# Patient Record
Sex: Male | Born: 1950 | Race: White | Hispanic: No | Marital: Married | Smoking: Never smoker
Health system: Southern US, Community
[De-identification: ages and names within clinical notes are randomized; demographics above are authoritative.]

## PROBLEM LIST (undated history)

## (undated) DIAGNOSIS — E785 Hyperlipidemia, unspecified: Secondary | ICD-10-CM

## (undated) DIAGNOSIS — N189 Chronic kidney disease, unspecified: Secondary | ICD-10-CM

## (undated) DIAGNOSIS — E559 Vitamin D deficiency, unspecified: Secondary | ICD-10-CM

## (undated) DIAGNOSIS — K219 Gastro-esophageal reflux disease without esophagitis: Secondary | ICD-10-CM

## (undated) DIAGNOSIS — I10 Essential (primary) hypertension: Secondary | ICD-10-CM

## (undated) DIAGNOSIS — R7303 Prediabetes: Secondary | ICD-10-CM

## (undated) DIAGNOSIS — T7840XA Allergy, unspecified, initial encounter: Secondary | ICD-10-CM

## (undated) DIAGNOSIS — H269 Unspecified cataract: Secondary | ICD-10-CM

## (undated) DIAGNOSIS — C801 Malignant (primary) neoplasm, unspecified: Secondary | ICD-10-CM

## (undated) HISTORY — DX: Essential (primary) hypertension: I10

## (undated) HISTORY — DX: Gastro-esophageal reflux disease without esophagitis: K21.9

## (undated) HISTORY — DX: Allergy, unspecified, initial encounter: T78.40XA

## (undated) HISTORY — DX: Hyperlipidemia, unspecified: E78.5

## (undated) HISTORY — DX: Vitamin D deficiency, unspecified: E55.9

## (undated) HISTORY — PX: BASAL CELL CARCINOMA EXCISION: SHX1214

## (undated) HISTORY — DX: Prediabetes: R73.03

## (undated) HISTORY — DX: Chronic kidney disease, unspecified: N18.9

## (undated) HISTORY — PX: SPLENECTOMY: SUR1306

## (undated) HISTORY — DX: Unspecified cataract: H26.9

## (undated) HISTORY — DX: Malignant (primary) neoplasm, unspecified: C80.1

## (undated) HISTORY — PX: KNEE ARTHROSCOPY: SHX127

## (undated) HISTORY — PX: OTHER SURGICAL HISTORY: SHX169

## (undated) HISTORY — PX: COLONOSCOPY: SHX174

---

## 1998-05-02 ENCOUNTER — Ambulatory Visit (HOSPITAL_BASED_OUTPATIENT_CLINIC_OR_DEPARTMENT_OTHER): Admission: RE | Admit: 1998-05-02 | Discharge: 1998-05-02 | Payer: Self-pay | Admitting: *Deleted

## 1999-05-21 ENCOUNTER — Ambulatory Visit (HOSPITAL_COMMUNITY): Admission: RE | Admit: 1999-05-21 | Discharge: 1999-05-21 | Payer: Self-pay | Admitting: Internal Medicine

## 1999-05-21 ENCOUNTER — Encounter: Payer: Self-pay | Admitting: Internal Medicine

## 1999-12-29 ENCOUNTER — Ambulatory Visit (HOSPITAL_COMMUNITY): Admission: RE | Admit: 1999-12-29 | Discharge: 1999-12-29 | Payer: Self-pay | Admitting: *Deleted

## 2001-03-30 ENCOUNTER — Encounter: Payer: Self-pay | Admitting: Internal Medicine

## 2001-03-30 ENCOUNTER — Ambulatory Visit (HOSPITAL_COMMUNITY): Admission: RE | Admit: 2001-03-30 | Discharge: 2001-03-30 | Payer: Self-pay | Admitting: Internal Medicine

## 2001-05-09 ENCOUNTER — Encounter (INDEPENDENT_AMBULATORY_CARE_PROVIDER_SITE_OTHER): Payer: Self-pay | Admitting: Specialist

## 2001-05-09 ENCOUNTER — Ambulatory Visit (HOSPITAL_BASED_OUTPATIENT_CLINIC_OR_DEPARTMENT_OTHER): Admission: RE | Admit: 2001-05-09 | Discharge: 2001-05-09 | Payer: Self-pay | Admitting: Plastic Surgery

## 2002-09-24 ENCOUNTER — Ambulatory Visit (HOSPITAL_COMMUNITY): Admission: RE | Admit: 2002-09-24 | Discharge: 2002-09-24 | Payer: Self-pay | Admitting: Internal Medicine

## 2002-09-24 ENCOUNTER — Encounter: Payer: Self-pay | Admitting: Internal Medicine

## 2007-05-26 ENCOUNTER — Ambulatory Visit: Payer: Self-pay | Admitting: Internal Medicine

## 2007-06-09 ENCOUNTER — Ambulatory Visit: Payer: Self-pay | Admitting: Internal Medicine

## 2008-09-06 ENCOUNTER — Ambulatory Visit (HOSPITAL_BASED_OUTPATIENT_CLINIC_OR_DEPARTMENT_OTHER): Admission: RE | Admit: 2008-09-06 | Discharge: 2008-09-06 | Payer: Self-pay | Admitting: Orthopedic Surgery

## 2008-10-14 ENCOUNTER — Ambulatory Visit (HOSPITAL_COMMUNITY): Admission: RE | Admit: 2008-10-14 | Discharge: 2008-10-14 | Payer: Self-pay | Admitting: Internal Medicine

## 2010-02-06 ENCOUNTER — Encounter: Admission: RE | Admit: 2010-02-06 | Discharge: 2010-02-06 | Payer: Self-pay | Admitting: Otolaryngology

## 2011-05-04 NOTE — Op Note (Signed)
[name], [name]                ACCOUNT NO.:  [id number]   MEDICAL RECORD NO.:  [id number]          PATIENT TYPE:  AMB   LOCATION:  DSC                          FACILITY:  [facility]   PHYSICIAN:  Eulas Post, MD    DATE OF BIRTH:  [birth year]   DATE OF PROCEDURE:  09/06/2008  DATE OF DISCHARGE:                               OPERATIVE REPORT   PREOPERATIVE DIAGNOSIS:  Left distal biceps tendon rupture.   POSTOPERATIVE DIAGNOSIS:  Left distal biceps tendon rupture.   OPERATIVE PROCEDURE:  Left distal biceps tendon repair.   OPERATIVE IMPLANT:  Biomet ZipLoop button x1.   PREOPERATIVE INDICATIONS:  Mr. Barry Flynn is a 60 year old man who  tore his biceps tendon.  He elected to undergo the above named  procedures.  The risks, benefits and alternatives were discussed with  him preoperatively including, but not limited to risks of infection,  bleeding, nerve injury, recurrent biceps tendon rupture, elbow  stiffness, loss of function, posterior interosseous nerve injury,  interosseous synostosis, cardiopulmonary complications, among others,  and he was willing to proceed.   OPERATIVE PROCEDURE:  The patient was brought to the operating room and  placed in supine position.  General anesthesia was administered.  A 1 g  of intravenous Ancef was given after a test dose due to a history of a  PENICILLIN allergy.  Left upper extremity was prepped and draped in  usual sterile fashion.  Tourniquet was utilized and total tourniquet  time was 1 hour and 7 minutes.  After prep and drape, anterior incision  was made protecting neurovascular structures.  Biceps tendon was exposed  and the end of the tendon was debrided back to normal tendon.  We then  used the #2 Maxon using running stitch down the tendon and then secured  the ZipLoop and then ran back up the tendon and tied it together.  We  then exposed the radial tuberosity and placed a guide pin and confirmed  under fluoroscopy.  We aimed  the pin distal and ulnar in order to reduce  the risk of injury to the posterior interosseous nerve.  We then placed  the pin bicortically and then used the 7-mm acorn reamer to open the  hole for the tendon.  We then drilled over the guidewire with a 4 mm to  the second cortex and then used a bur to smoothen the edges of the hole.  We made a trough proximally delivering the tendon into the bone.  We  then passed the button and care was taken to flip the button immediately  after it past the second cortex, so that no tissue could be entrapped.  We then confirmed that we had delivered a total of 1 cm of tendon into  the tunnel, based on markings that I made on the tendon before we  delivered it.  We then irrigated the wounds copiously and confirmed  excellent secure restoration of the biceps insertion.  We were able to  visualize the biceps stump on the radial tuberosity and placed it  anatomically.  We then closed the subcutaneous  tissue with 3-0 Vicryl  followed by a 4-0 Monocryl.  Steri-Strips  were placed followed by sterile gauze.  The patient was awakened and was  placed in a posterior splint.  He returned to the PACU in stable and  satisfactory condition.  There were no complications.  The patient  tolerated the procedure well.      Eulas Post, MD  Electronically Signed     JPL/MEDQ  D:  09/06/2008  T:  09/06/2008  Job:  [id number]

## 2011-05-07 NOTE — Op Note (Signed)
[place]  Patient:    Barry Flynn, Barry Flynn                       MRN: [id number] Proc. Date: 05/09/01 Adm. Date:  [id number] Attending:  Eloise Levels                           Operative Report  DATE OF BIRTH:  [birth year]  PREOPERATIVE DIAGNOSES: 1. 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 2. A 1.5 cm dysplastic nevus - right breast. 3. A 1.2 cm dysplastic nevus - left paraspinal lower back.  POSTOPERATIVE DIAGNOSES: 1. 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 2. A 1.5 cm dysplastic nevus - right breast. 3. A 1.2 cm dysplastic nevus - left paraspinal lower back.  PROCEDURES: 1. Excision of 0.9 cm basal cell carcinoma - left temple with intraoperative    frozen section evaluation. 2. Excision of 1.5 cm right breast dysplastic nevus. 3. Complex closure of 3.1 cm right breast incision. 4. Excision 1.2 cm dysplastic nevus - left lower paraspinal back. 5. Complex closure of 2.5 cm left paraspinal back incision.  SURGEON:  Mary A. Contogiannis, M.D.  ANESTHESIA:  IV sedation along with 1% lidocaine with epinephrine.  ANESTHESIOLOGIST:  Kaylyn Layer. Michelle Piper, M.D.  COMPLICATIONS: None.  INDICATIONS FOR THE PROCEDURE:  The patient is a 60 year old Caucasian male, who was referred by Dr. Theodora Blow. Danella Deis for re-excision of a biopsy-proven basal cell carcinoma of the left temple, dysplastic nevus of the right breast and a dysplastic nevus of the left back.  The patient now requests that I proceed with excision of these lesions.  DESCRIPTION OF PROCEDURE:  The lesions were marked in the preoperative holding area.  The patient was then brought back to the operating room and placed on OR table in supine position.  After adequate IV sedation was obtained, the patients left face and right chest were prepped with Betadine and draped in a sterile fashion.  The skin and subcutaneous tissues around the areas of both skin lesions  were then injected with 1% lidocaine with epinephrine.  After adequate anesthesia and hemostasis had taken effect, the procedure was begun. First, the left temporal healing biopsy site of the basal cell cancer was excised with a few mm of normal skin all around.  The excision was then carried full-thickness through the skin into the subcutaneous tissues.  The lesion was then marked at the 12 oclock position and passed off the table to undergo intraoperative frozen section diagnosis.  After pathologic evaluation, the pathologist called to say that all the surgical margins were clear.  The incision was then closed.  Meticulous hemostasis was obtained.  The dermal layer was closed with 5-0 Monocryl interrupted sutures, followed by a 6-0 Prolene running baseball stitch on the skin.  Attention was then turned to the right breast.  The dysplastic nevus was then excised with a mm or two of normal skin all around it.  This was done full-thickness through the skin into the subcutaneous tissues.  The lesion was then passed off the table to undergo permanent pathologic section evaluation.  Meticulous hemostasis was then obtained with Bovie electrocautery.  The wound was then closed in complex fashion, first closing the deeper subcutaneous tissues with a 4-0 Monocryl interrupted sutures.  The dermal layer was then also closed with a 4-0 Monocryl suture.  Next,  the skin was closed with a 4-0 Monocryl in a running intercuticular stitch.   Both the left temporal and the right breast incisions were dressed with benzoin and Steri-Strips.  The patient was then awakened a little from the IV sedation and turned onto the prone position for his back. The sedation was then deepened.  The left back was prepped with Betadine and draped in a sterile fashion.  The skin and subcutaneous tissues around the area of the dysplastic nevus were injected with 1% lidocaine with epinephrine. After adequate hemostasis and  anesthesia had taken effect, the procedure was begun.  The dysplastic nevus was excised with 1-2 mm of normal skin all around.  Full-thickness through the skin to subcutaneous tissues.  The skin edges were then undermined for easy closure.  Meticulous hemostasis was obtained with the Bovie electrocautery.  The incision was then closed in a complex fashion.  The deeper subcutaneous tissues and superficial fascia were closed with 3-0 Monocryl interrupted sutures.  The dermal layer was then closed using a 3-0 Monocryl interrupted dermal sutures.  The skin was then closed with a 4-0 Monocryl running intercuticular stitch.  The incision was dressed with benzoin and Steri-Strips.  There were no complications.  The patient tolerated the procedure well.  He was then awakened from the IV sedation and taken to recovery room in stable condition.  A final needle and sponge were correct at the end of the case.  He was discharged home to stable condition and will return to the office tomorrow for a follow-up appointment. D:  05/09/01 TD:  05/09/01 Job: [id number]

## 2011-09-20 LAB — BASIC METABOLIC PANEL
BUN: 14
CO2: 24
Calcium: 9.1
Chloride: 107
Creatinine, Ser: 0.9
GFR calc Af Amer: >60
GFR calc non Af Amer: >60
Glucose, Bld: 92
Potassium: 4.2
Sodium: 138

## 2011-12-21 HISTORY — PX: ELBOW ARTHROSCOPY WITH TENDON RECONSTRUCTION: SHX5616

## 2014-01-19 DIAGNOSIS — E785 Hyperlipidemia, unspecified: Secondary | ICD-10-CM | POA: Insufficient documentation

## 2014-01-19 DIAGNOSIS — R7303 Prediabetes: Secondary | ICD-10-CM | POA: Insufficient documentation

## 2014-01-19 DIAGNOSIS — J45909 Unspecified asthma, uncomplicated: Secondary | ICD-10-CM | POA: Insufficient documentation

## 2014-01-19 DIAGNOSIS — E559 Vitamin D deficiency, unspecified: Secondary | ICD-10-CM | POA: Insufficient documentation

## 2014-01-19 DIAGNOSIS — I10 Essential (primary) hypertension: Secondary | ICD-10-CM | POA: Insufficient documentation

## 2014-01-19 DIAGNOSIS — T7840XA Allergy, unspecified, initial encounter: Secondary | ICD-10-CM | POA: Insufficient documentation

## 2014-01-20 NOTE — Patient Instructions (Signed)

## 2014-01-20 NOTE — Progress Notes (Signed)
Patient ID: Barry Flynn, male   DOB: September 02, 1951, 63 y.o.   MRN: [id number]  This very nice 63 y.o. MWM presents for 3 month follow up with Hypertension, Hyperlipidemia, Pre-Diabetes and Vitamin D Deficiency.    HTN predates since 1995. BP has been controlled at home. Today's BP: 120/76 mmHg. Patient denies any cardiac type chest pain, palpitations, dyspnea/orthopnea/PND, dizziness, claudication, or dependent edema.   Hyperlipidemia is controlled with diet & meds. Last Cholesterol was  115, Triglycerides were 55, HDL 45  and LDL 59 in July 2014. Patient denies myalgias or other med SE's.    Also, the patient has history of PreDiabetes with A1c 6.0% in 2012 and with last A1c of  5.6% in July 2014. Patient denies any symptoms of reactive hypoglycemia, diabetic polys, paresthesias or visual blurring.   Further, Patient has history of Vitamin D Deficiency of 34 in 2012 with last vitamin D of 65 in July 2014.Marland Kitchen Patient supplements vitamin D without any suspected side-effects.    Medication List         ALLEGRA PO  Take by mouth daily.     ALPRAZolam 0.5 MG tablet  Commonly known as:  XANAX  Take 0.5 mg by mouth 3 (three) times daily as needed for anxiety.     atorvastatin 20 MG tablet  Commonly known as:  LIPITOR  Take 20 mg by mouth daily.     augmented betamethasone dipropionate 0.05 % cream  Commonly known as:  DIPROLENE-AF     fluticasone 50 MCG/ACT nasal spray  Commonly known as:  FLONASE  Place 2 sprays into both nostrils daily.     lansoprazole 30 MG capsule  Commonly known as:  PREVACID  Take 30 mg by mouth daily at 12 noon.     lisinopril 20 MG tablet  Commonly known as:  PRINIVIL,ZESTRIL  Take 20 mg by mouth daily.     montelukast 10 MG tablet  Commonly known as:  SINGULAIR  Take 10 mg by mouth at bedtime.     VITAMIN D PO  Take 4,000 Int'l Units by mouth.         Allergies  Allergen Reactions  . Hismanal [Astemizole]   . Latex   . Penicillins Hives     PMHx:   Past Medical History  Diagnosis Date  . Hyperlipidemia   . Hypertension   . Vitamin D deficiency   . Allergy   . Prediabetes   . Asthma     FHx:    Reviewed / unchanged  SHx:    Reviewed / unchanged  Systems Review: Constitutional: Denies fever, chills, wt changes, headaches, insomnia, fatigue, night sweats, change in appetite. Eyes: Denies redness, blurred vision, diplopia, discharge, itchy, watery eyes.  ENT: Denies discharge, congestion, post nasal drip, epistaxis, sore throat, earache, hearing loss, dental pain, tinnitus, vertigo, sinus pain, snoring.  CV: Denies chest pain, palpitations, irregular heartbeat, syncope, dyspnea, diaphoresis, orthopnea, PND, claudication, edema. Respiratory: denies cough, dyspnea, DOE, pleurisy, hoarseness, laryngitis, wheezing.  Gastrointestinal: Denies dysphagia, odynophagia, heartburn, reflux, water brash, abdominal pain or cramps, nausea, vomiting, bloating, diarrhea, constipation, hematemesis, melena, hematochezia,  or hemorrhoids. Genitourinary: Denies dysuria, frequency, urgency, nocturia, hesitancy, discharge, hematuria, flank pain. Musculoskeletal: Denies arthralgias, myalgias, stiffness, jt. swelling, pain, limp, strain/sprain.  Skin: Denies pruritus, rash, hives, warts, acne, eczema, change in skin lesion(s). Neuro: No weakness, tremor, incoordination, spasms, paresthesia, or pain. Psychiatric: Denies confusion, memory loss, or sensory loss. Endo: Denies change in weight, skin, hair change.  Heme/Lymph: No excessive  bleeding, bruising, orenlarged lymph nodes.  BP: 120/76  Pulse: 64  Temp: 97.7 F (36.5 C)  Resp: 16      On Exam: Appears well nourished - in no distress. Eyes: PERRLA, EOMs, conjunctiva no swelling or erythema. Sinuses: No frontal/maxillary tenderness ENT/Mouth: EAC's clear, TM's nl w/o erythema, bulging. Nares clear w/o erythema, swelling, exudates. Oropharynx clear without erythema or exudates. Oral  hygiene is good. Tongue normal, non obstructing. Hearing intact.  Neck: Supple. Thyroid nl. Car 2+/2+ without bruits, nodes or JVD. Chest: Respirations nl with BS clear & equal w/o rales, rhonchi, wheezing or stridor.  Cor: Heart sounds normal w/ regular rate and rhythm without sig. murmurs, gallops, clicks, or rubs. Peripheral pulses normal and equal  without edema.  Abdomen: Soft & bowel sounds normal. Non-tender w/o guarding, rebound, hernias, masses, or organomegaly.  Lymphatics: Unremarkable.  Musculoskeletal: Full ROM all peripheral extremities, joint stability, 5/5 strength, and normal gait.  Skin: Warm, dry without exposed rashes, lesions, ecchymosis apparent.  Neuro: Cranial nerves intact, reflexes equal bilaterally. Sensory-motor testing grossly intact. Tendon reflexes grossly intact.  Pysch: Alert & oriented x 3. Insight and judgement nl & appropriate. No ideations.  Assessment and Plan:  1. Hypertension - Continue monitor blood pressure at home. Continue diet/meds same.  2. Hyperlipidemia - Continue diet/meds, exercise,& lifestyle modifications. Continue monitor periodic cholesterol/liver & renal functions   3. Pre-diabetes - Continue diet, exercise, lifestyle modifications. Monitor appropriate labs.  4. Vitamin D Deficiency - Continue supplementation.  Recommended regular exercise, BP monitoring, weight control, and discussed med and SE's. Recommended labs to assess and monitor clinical status. Further disposition pending results of labs.

## 2014-01-21 ENCOUNTER — Encounter: Payer: Self-pay | Admitting: Internal Medicine

## 2014-01-21 ENCOUNTER — Ambulatory Visit (INDEPENDENT_AMBULATORY_CARE_PROVIDER_SITE_OTHER): Payer: 59 | Admitting: Internal Medicine

## 2014-01-21 VITALS — BP 120/76 | HR 64 | Temp 97.7°F | Resp 16 | Wt 182.0 lb

## 2014-01-21 DIAGNOSIS — I10 Essential (primary) hypertension: Secondary | ICD-10-CM

## 2014-01-21 DIAGNOSIS — Z79899 Other long term (current) drug therapy: Secondary | ICD-10-CM

## 2014-01-21 DIAGNOSIS — R7309 Other abnormal glucose: Secondary | ICD-10-CM

## 2014-01-21 DIAGNOSIS — E782 Mixed hyperlipidemia: Secondary | ICD-10-CM

## 2014-01-21 DIAGNOSIS — R7303 Prediabetes: Secondary | ICD-10-CM

## 2014-01-21 DIAGNOSIS — E785 Hyperlipidemia, unspecified: Secondary | ICD-10-CM

## 2014-01-21 DIAGNOSIS — E559 Vitamin D deficiency, unspecified: Secondary | ICD-10-CM

## 2014-01-21 LAB — CBC WITH DIFFERENTIAL/PLATELET
BASOS ABS: 0 10*3/uL (ref 0.0–0.1)
BASOS PCT: 1 % (ref 0–1)
Eosinophils Absolute: 0.3 10*3/uL (ref 0.0–0.7)
Eosinophils Relative: 5 % (ref 0–5)
HCT: 45.5 % (ref 39.0–52.0)
HEMOGLOBIN: 16.2 g/dL (ref 13.0–17.0)
Lymphocytes Relative: 37 % (ref 12–46)
Lymphs Abs: 2.4 10*3/uL (ref 0.7–4.0)
MCH: 31.8 pg (ref 26.0–34.0)
MCHC: 35.6 g/dL (ref 30.0–36.0)
MCV: 89.4 fL (ref 78.0–100.0)
MONOS PCT: 10 % (ref 3–12)
Monocytes Absolute: 0.7 10*3/uL (ref 0.1–1.0)
NEUTROS ABS: 3 10*3/uL (ref 1.7–7.7)
NEUTROS PCT: 47 % (ref 43–77)
Platelets: 326 10*3/uL (ref 150–400)
RBC: 5.09 MIL/uL (ref 4.22–5.81)
RDW: 13.5 % (ref 11.5–15.5)
WBC: 6.4 10*3/uL (ref 4.0–10.5)

## 2014-01-21 LAB — HEPATIC FUNCTION PANEL
ALT: 27 U/L (ref 0–53)
AST: 20 U/L (ref 0–37)
Albumin: 4.6 g/dL (ref 3.5–5.2)
Alkaline Phosphatase: 62 U/L (ref 39–117)
BILIRUBIN DIRECT: 0.1 mg/dL (ref 0.0–0.3)
BILIRUBIN INDIRECT: 0.4 mg/dL (ref 0.2–1.2)
BILIRUBIN TOTAL: 0.5 mg/dL (ref 0.2–1.2)
Total Protein: 6.8 g/dL (ref 6.0–8.3)

## 2014-01-21 LAB — LIPID PANEL
CHOL/HDL RATIO: 2.6 ratio
CHOLESTEROL: 135 mg/dL (ref 0–200)
HDL: 52 mg/dL (ref >39)
LDL Cholesterol: 57 mg/dL (ref 0–99)
TRIGLYCERIDES: 131 mg/dL (ref <150)
VLDL: 26 mg/dL (ref 0–40)

## 2014-01-21 LAB — BASIC METABOLIC PANEL WITH GFR
BUN: 17 mg/dL (ref 6–23)
CO2: 27 mEq/L (ref 19–32)
Calcium: 10.1 mg/dL (ref 8.4–10.5)
Chloride: 103 mEq/L (ref 96–112)
Creat: 0.97 mg/dL (ref 0.50–1.35)
GFR, EST NON AFRICAN AMERICAN: 84 mL/min
GLUCOSE: 99 mg/dL (ref 70–99)
POTASSIUM: 4.7 meq/L (ref 3.5–5.3)
SODIUM: 139 meq/L (ref 135–145)

## 2014-01-21 LAB — HEMOGLOBIN A1C
Hgb A1c MFr Bld: 5.9 % — ABNORMAL HIGH (ref <5.7)
Mean Plasma Glucose: 123 mg/dL — ABNORMAL HIGH (ref <117)

## 2014-01-21 LAB — MAGNESIUM: Magnesium: 2.1 mg/dL (ref 1.5–2.5)

## 2014-01-21 LAB — TSH: TSH: 1.551 u[IU]/mL (ref 0.350–4.500)

## 2014-01-22 ENCOUNTER — Encounter: Payer: Self-pay | Admitting: Internal Medicine

## 2014-01-22 LAB — INSULIN, FASTING: Insulin fasting, serum: 16 u[IU]/mL (ref 3–28)

## 2014-01-22 LAB — VITAMIN D 25 HYDROXY (VIT D DEFICIENCY, FRACTURES): Vit D, 25-Hydroxy: 63 ng/mL (ref 30–89)

## 2014-07-12 ENCOUNTER — Other Ambulatory Visit: Payer: 59

## 2014-07-12 DIAGNOSIS — Z1159 Encounter for screening for other viral diseases: Secondary | ICD-10-CM

## 2014-07-12 DIAGNOSIS — Z125 Encounter for screening for malignant neoplasm of prostate: Secondary | ICD-10-CM

## 2014-07-12 DIAGNOSIS — Z0000 Encounter for general adult medical examination without abnormal findings: Secondary | ICD-10-CM

## 2014-07-12 LAB — CBC WITH DIFFERENTIAL/PLATELET
BASOS PCT: 1 % (ref 0–1)
Basophils Absolute: 0.1 10*3/uL (ref 0.0–0.1)
Eosinophils Absolute: 0.2 10*3/uL (ref 0.0–0.7)
Eosinophils Relative: 4 % (ref 0–5)
HEMATOCRIT: 43.6 % (ref 39.0–52.0)
Hemoglobin: 15.6 g/dL (ref 13.0–17.0)
Lymphocytes Relative: 40 % (ref 12–46)
Lymphs Abs: 2.2 10*3/uL (ref 0.7–4.0)
MCH: 31.6 pg (ref 26.0–34.0)
MCHC: 35.8 g/dL (ref 30.0–36.0)
MCV: 88.4 fL (ref 78.0–100.0)
MONO ABS: 0.5 10*3/uL (ref 0.1–1.0)
MONOS PCT: 9 % (ref 3–12)
Neutro Abs: 2.6 10*3/uL (ref 1.7–7.7)
Neutrophils Relative %: 46 % (ref 43–77)
Platelets: 314 10*3/uL (ref 150–400)
RBC: 4.93 MIL/uL (ref 4.22–5.81)
RDW: 13.3 % (ref 11.5–15.5)
WBC: 5.6 10*3/uL (ref 4.0–10.5)

## 2014-07-12 LAB — MICROALBUMIN / CREATININE URINE RATIO
Creatinine, Urine: 163.4 mg/dL
MICROALB UR: 0.75 mg/dL (ref 0.00–1.89)
Microalb Creat Ratio: 4.6 mg/g (ref 0.0–30.0)

## 2014-07-12 LAB — BASIC METABOLIC PANEL WITH GFR
BUN: 19 mg/dL (ref 6–23)
CALCIUM: 9.6 mg/dL (ref 8.4–10.5)
CO2: 27 meq/L (ref 19–32)
Chloride: 104 mEq/L (ref 96–112)
Creat: 1.06 mg/dL (ref 0.50–1.35)
GFR, Est African American: 87 mL/min
GFR, Est Non African American: 75 mL/min
GLUCOSE: 93 mg/dL (ref 70–99)
POTASSIUM: 4.6 meq/L (ref 3.5–5.3)
Sodium: 138 mEq/L (ref 135–145)

## 2014-07-12 LAB — LIPID PANEL
Cholesterol: 133 mg/dL (ref 0–200)
HDL: 57 mg/dL (ref >39)
LDL CALC: 64 mg/dL (ref 0–99)
Total CHOL/HDL Ratio: 2.3 Ratio
Triglycerides: 59 mg/dL (ref <150)
VLDL: 12 mg/dL (ref 0–40)

## 2014-07-12 LAB — HEPATIC FUNCTION PANEL
ALBUMIN: 4.4 g/dL (ref 3.5–5.2)
ALT: 22 U/L (ref 0–53)
AST: 20 U/L (ref 0–37)
Alkaline Phosphatase: 52 U/L (ref 39–117)
BILIRUBIN TOTAL: 0.7 mg/dL (ref 0.2–1.2)
Bilirubin, Direct: 0.2 mg/dL (ref 0.0–0.3)
Indirect Bilirubin: 0.5 mg/dL (ref 0.2–1.2)
TOTAL PROTEIN: 6.9 g/dL (ref 6.0–8.3)

## 2014-07-12 LAB — URINALYSIS, ROUTINE W REFLEX MICROSCOPIC
BILIRUBIN URINE: NEGATIVE
Glucose, UA: NEGATIVE mg/dL
HGB URINE DIPSTICK: NEGATIVE
KETONES UR: NEGATIVE mg/dL
NITRITE: NEGATIVE
PROTEIN: NEGATIVE mg/dL
SPECIFIC GRAVITY, URINE: 1.019 (ref 1.005–1.030)
Urobilinogen, UA: 0.2 mg/dL (ref 0.0–1.0)
pH: 6.5 (ref 5.0–8.0)

## 2014-07-12 LAB — HEMOGLOBIN A1C
HEMOGLOBIN A1C: 5.8 % — AB (ref <5.7)
MEAN PLASMA GLUCOSE: 120 mg/dL — AB (ref <117)

## 2014-07-12 LAB — URINALYSIS, MICROSCOPIC ONLY
BACTERIA UA: NONE SEEN
CASTS: NONE SEEN
Crystals: NONE SEEN
SQUAMOUS EPITHELIAL / LPF: NONE SEEN

## 2014-07-12 LAB — VITAMIN B12: VITAMIN B 12: 510 pg/mL (ref 211–911)

## 2014-07-12 LAB — MAGNESIUM: MAGNESIUM: 2.2 mg/dL (ref 1.5–2.5)

## 2014-07-12 LAB — HEPATITIS A ANTIBODY, TOTAL: Hep A Total Ab: NONREACTIVE

## 2014-07-12 LAB — IRON AND TIBC
%SAT: 45 % (ref 20–55)
Iron: 139 ug/dL (ref 42–165)
TIBC: 308 ug/dL (ref 215–435)
UIBC: 169 ug/dL (ref 125–400)

## 2014-07-12 LAB — TSH: TSH: 2.076 u[IU]/mL (ref 0.350–4.500)

## 2014-07-12 LAB — HEPATITIS B CORE ANTIBODY, TOTAL: Hep B Core Total Ab: NONREACTIVE

## 2014-07-12 LAB — HEPATITIS B SURFACE ANTIBODY,QUALITATIVE: HEP B S AB: NEGATIVE

## 2014-07-12 LAB — HEPATITIS C ANTIBODY: HCV Ab: NEGATIVE

## 2014-07-12 LAB — URIC ACID: Uric Acid, Serum: 5.7 mg/dL (ref 4.0–7.8)

## 2014-07-12 LAB — TESTOSTERONE: TESTOSTERONE: 327 ng/dL (ref 300–890)

## 2014-07-13 LAB — INSULIN, FASTING: INSULIN FASTING, SERUM: 11 u[IU]/mL (ref 3–28)

## 2014-07-13 LAB — VITAMIN D 25 HYDROXY (VIT D DEFICIENCY, FRACTURES): Vit D, 25-Hydroxy: 67 ng/mL (ref 30–89)

## 2014-07-13 LAB — PSA: PSA: 0.47 ng/mL (ref <=4.00)

## 2014-07-15 LAB — HEPATITIS B E ANTIBODY: HEPATITIS BE ANTIBODY: NONREACTIVE

## 2014-07-16 ENCOUNTER — Encounter: Payer: Self-pay | Admitting: Physician Assistant

## 2014-07-16 ENCOUNTER — Ambulatory Visit (INDEPENDENT_AMBULATORY_CARE_PROVIDER_SITE_OTHER): Payer: 59 | Admitting: Physician Assistant

## 2014-07-16 VITALS — BP 110/70 | HR 56 | Temp 97.7°F | Resp 16 | Ht 68.0 in | Wt 182.0 lb

## 2014-07-16 DIAGNOSIS — Z1212 Encounter for screening for malignant neoplasm of rectum: Secondary | ICD-10-CM

## 2014-07-16 DIAGNOSIS — I10 Essential (primary) hypertension: Secondary | ICD-10-CM

## 2014-07-16 NOTE — Patient Instructions (Signed)
Preventative Care for Adults, Male       REGULAR HEALTH EXAMS:  A routine yearly physical is a good way to check in with your primary care provider about your health and preventive screening. It is also an opportunity to share updates about your health and any concerns you have, and receive a thorough all-over exam.   Most health insurance companies pay for at least some preventative services.  Check with your health plan for specific coverages.  WHAT PREVENTATIVE SERVICES DO MEN NEED?  Adult men should have their weight and blood pressure checked regularly.   Men age 35 and older should have their cholesterol levels checked regularly.  Beginning at age 50 and continuing to age 75, men should be screened for colorectal cancer.  Certain people should may need continued testing until age 85.  Other cancer screening may include exams for testicular and prostate cancer.  Updating vaccinations is part of preventative care.  Vaccinations help protect against diseases such as the flu.  Lab tests are generally done as part of preventative care to screen for anemia and blood disorders, to screen for problems with the kidneys and liver, to screen for bladder problems, to check blood sugar, and to check your cholesterol level.  Preventative services generally include counseling about diet, exercise, avoiding tobacco, drugs, excessive alcohol consumption, and sexually transmitted infections.    GENERAL RECOMMENDATIONS FOR GOOD HEALTH:  Healthy diet:  Eat a variety of foods, including fruit, vegetables, animal or vegetable protein, such as meat, fish, chicken, and eggs, or beans, lentils, tofu, and grains, such as rice.  Drink plenty of water daily.  Decrease saturated fat in the diet, avoid lots of red meat, processed foods, sweets, fast foods, and fried foods.  Exercise:  Aerobic exercise helps maintain good heart health. At least 30-40 minutes of moderate-intensity exercise is recommended.  For example, a brisk walk that increases your heart rate and breathing. This should be done on most days of the week.   Find a type of exercise or a variety of exercises that you enjoy so that it becomes a part of your daily life.  Examples are running, walking, swimming, water aerobics, and biking.  For motivation and support, explore group exercise such as aerobic class, spin class, Zumba, Yoga,or  martial arts, etc.    Set exercise goals for yourself, such as a certain weight goal, walk or run in a race such as a 5k walk/run.  Speak to your primary care provider about exercise goals.  Disease prevention:  If you smoke or chew tobacco, find out from your caregiver how to quit. It can literally save your life, no matter how long you have been a tobacco user. If you do not use tobacco, never begin.   Maintain a healthy diet and normal weight. Increased weight leads to problems with blood pressure and diabetes.   The Body Mass Index or BMI is a way of measuring how much of your body is fat. Having a BMI above 27 increases the risk of heart disease, diabetes, hypertension, stroke and other problems related to obesity. Your caregiver can help determine your BMI and based on it develop an exercise and dietary program to help you achieve or maintain this important measurement at a healthful level.  High blood pressure causes heart and blood vessel problems.  Persistent high blood pressure should be treated with medicine if weight loss and exercise do not work.   Fat and cholesterol leaves deposits in your arteries   that can block them. This causes heart disease and vessel disease elsewhere in your body.  If your cholesterol is found to be high, or if you have heart disease or certain other medical conditions, then you may need to have your cholesterol monitored frequently and be treated with medication.   Ask if you should have a stress test if your history suggests this. A stress test is a test done on  a treadmill that looks for heart disease. This test can find disease prior to there being a problem.  Avoid drinking alcohol in excess (more than two drinks per day).  Avoid use of street drugs. Do not share needles with anyone. Ask for professional help if you need assistance or instructions on stopping the use of alcohol, cigarettes, and/or drugs.  Brush your teeth twice a day with fluoride toothpaste, and floss once a day. Good oral hygiene prevents tooth decay and gum disease. The problems can be painful, unattractive, and can cause other health problems. Visit your dentist for a routine oral and dental check up and preventive care every 6-12 months.   Look at your skin regularly.  Use a mirror to look at your back. Notify your caregivers of changes in moles, especially if there are changes in shapes, colors, a size larger than a pencil eraser, an irregular border, or development of new moles.  Safety:  Use seatbelts 100% of the time, whether driving or as a passenger.  Use safety devices such as hearing protection if you work in environments with loud noise or significant background noise.  Use safety glasses when doing any work that could send debris in to the eyes.  Use a helmet if you ride a bike or motorcycle.  Use appropriate safety gear for contact sports.  Talk to your caregiver about gun safety.  Use sunscreen with a SPF (or skin protection factor) of 15 or greater.  Lighter skinned people are at a greater risk of skin cancer. Don't forget to also wear sunglasses in order to protect your eyes from too much damaging sunlight. Damaging sunlight can accelerate cataract formation.   Practice safe sex. Use condoms. Condoms are used for birth control and to help reduce the spread of sexually transmitted infections (or STIs).  Some of the STIs are gonorrhea (the clap), chlamydia, syphilis, trichomonas, herpes, HPV (human papilloma virus) and HIV (human immunodeficiency virus) which causes AIDS.  The herpes, HIV and HPV are viral illnesses that have no cure. These can result in disability, cancer and death.   Keep carbon monoxide and smoke detectors in your home functioning at all times. Change the batteries every 6 months or use a model that plugs into the wall.   Vaccinations:  Stay up to date with your tetanus shots and other required immunizations. You should have a booster for tetanus every 10 years. Be sure to get your flu shot every year, since 5%-20% of the [place] population comes down with the flu. The flu vaccine changes each year, so being vaccinated once is not enough. Get your shot in the fall, before the flu season peaks.   Other vaccines to consider:  Pneumococcal vaccine to protect against certain types of pneumonia.  This is normally recommended for adults age 65 or older.  However, adults younger than 62 years old with certain underlying conditions such as diabetes, heart or lung disease should also receive the vaccine.  Shingles vaccine to protect against Varicella Zoster if you are older than age 60, or younger   than 62 years old with certain underlying illness.  Hepatitis A vaccine to protect against a form of infection of the liver by a virus acquired from food.  Hepatitis B vaccine to protect against a form of infection of the liver by a virus acquired from blood or body fluids, particularly if you work in health care.  If you plan to travel internationally, check with your local health department for specific vaccination recommendations.  Cancer Screening:  Most routine colon cancer screening begins at the age of 50. On a yearly basis, doctors may provide special easy to use take-home tests to check for hidden blood in the stool. Sigmoidoscopy or colonoscopy can detect the earliest forms of colon cancer and is life saving. These tests use a small camera at the end of a tube to directly examine the colon. Speak to your caregiver about this at age 50, when routine  screening begins (and is repeated every 5 years unless early forms of pre-cancerous polyps or small growths are found).   At the age of 50 men usually start screening for prostate cancer every year. Screening may begin at a younger age for those with higher risk. Those at higher risk include African-Americans or having a family history of prostate cancer. There are two types of tests for prostate cancer:   Prostate-specific antigen (PSA) testing. Recent studies raise questions about prostate cancer using PSA and you should discuss this with your caregiver.   Digital rectal exam (in which your doctor's lubricated and gloved finger feels for enlargement of the prostate through the anus).   Screening for testicular cancer.  Do a monthly exam of your testicles. Gently roll each testicle between your thumb and fingers, feeling for any abnormal lumps. The best time to do this is after a hot shower or bath when the tissues are looser. Notify your caregivers of any lumps, tenderness or changes in size or shape immediately.     
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
## 2014-07-23 ENCOUNTER — Other Ambulatory Visit: Payer: Self-pay | Admitting: *Deleted

## 2014-07-23 DIAGNOSIS — Z1212 Encounter for screening for malignant neoplasm of rectum: Secondary | ICD-10-CM

## 2014-07-23 LAB — POC HEMOCCULT BLD/STL (HOME/3-CARD/SCREEN)
Card #2 Fecal Occult Blod, POC: NEGATIVE
Card #3 Fecal Occult Blood, POC: NEGATIVE
Fecal Occult Blood, POC: NEGATIVE

## 2014-09-30 ENCOUNTER — Encounter: Payer: Self-pay | Admitting: Physician Assistant

## 2014-09-30 MED ORDER — ATORVASTATIN CALCIUM 20 MG PO TABS: 20.0000 mg | ORAL_TABLET | Freq: Every day | ORAL | Status: DC

## 2014-10-25 ENCOUNTER — Encounter: Payer: Self-pay | Admitting: Physician Assistant

## 2014-10-25 ENCOUNTER — Ambulatory Visit (INDEPENDENT_AMBULATORY_CARE_PROVIDER_SITE_OTHER): Payer: 59 | Admitting: Physician Assistant

## 2014-10-25 VITALS — BP 118/70 | HR 64 | Temp 97.7°F | Resp 16 | Ht 68.0 in | Wt 180.0 lb

## 2014-10-25 DIAGNOSIS — R7303 Prediabetes: Secondary | ICD-10-CM

## 2014-10-25 DIAGNOSIS — E559 Vitamin D deficiency, unspecified: Secondary | ICD-10-CM

## 2014-10-25 DIAGNOSIS — I10 Essential (primary) hypertension: Secondary | ICD-10-CM

## 2014-10-25 DIAGNOSIS — R7309 Other abnormal glucose: Secondary | ICD-10-CM

## 2014-10-25 DIAGNOSIS — Z79899 Other long term (current) drug therapy: Secondary | ICD-10-CM

## 2014-10-25 DIAGNOSIS — E785 Hyperlipidemia, unspecified: Secondary | ICD-10-CM

## 2014-10-25 LAB — CBC WITH DIFFERENTIAL/PLATELET
Basophils Absolute: 0.1 10*3/uL (ref 0.0–0.1)
Basophils Relative: 1 % (ref 0–1)
EOS PCT: 4 % (ref 0–5)
Eosinophils Absolute: 0.2 10*3/uL (ref 0.0–0.7)
HCT: 45.5 % (ref 39.0–52.0)
Hemoglobin: 15.7 g/dL (ref 13.0–17.0)
LYMPHS ABS: 2.2 10*3/uL (ref 0.7–4.0)
Lymphocytes Relative: 36 % (ref 12–46)
MCH: 31.1 pg (ref 26.0–34.0)
MCHC: 34.5 g/dL (ref 30.0–36.0)
MCV: 90.1 fL (ref 78.0–100.0)
Monocytes Absolute: 0.7 10*3/uL (ref 0.1–1.0)
Monocytes Relative: 11 % (ref 3–12)
NEUTROS PCT: 48 % (ref 43–77)
Neutro Abs: 2.9 10*3/uL (ref 1.7–7.7)
Platelets: 320 10*3/uL (ref 150–400)
RBC: 5.05 MIL/uL (ref 4.22–5.81)
RDW: 13.6 % (ref 11.5–15.5)
WBC: 6.1 10*3/uL (ref 4.0–10.5)

## 2014-10-25 LAB — HEMOGLOBIN A1C
Hgb A1c MFr Bld: 5.9 % — ABNORMAL HIGH (ref <5.7)
Mean Plasma Glucose: 123 mg/dL — ABNORMAL HIGH (ref <117)

## 2014-10-25 NOTE — Progress Notes (Signed)
Assessment and Plan:  Hypertension: Continue medication, monitor blood pressure at home. Continue DASH diet.  Reminder to go to the ER if any CP, SOB, nausea, dizziness, severe HA, changes vision/speech, left arm numbness and tingling, and jaw pain. Cholesterol: Continue diet and exercise. Check cholesterol.  Pre-diabetes-Continue diet and exercise. Check A1C Vitamin D Def- check level and continue medications.   Continue diet and meds as discussed. Further disposition pending results of labs. May see back at CPE or may do a 4 month pending labs Future Appointments Date Time Provider [place]  07/17/2015 10:00 AM Vicie Mutters, PA-C [id number] None   HPI 63 y.o. male  presents for 3 month follow up with hypertension, hyperlipidemia, prediabetes and vitamin D. His blood pressure has been controlled at home, today their BP is BP: 118/70 mmHg He does workout, gym 3 x a week and walks 2 miles 2x a week. He denies chest pain, shortness of breath, dizziness.  He is on cholesterol medication, he was changed to 10mg  every other day and denies myalgias. His cholesterol is at goal. The cholesterol last visit was:   Lab Results  Component Value Date   CHOL 133 07/12/2014   HDL 57 07/12/2014   LDLCALC 64 07/12/2014   TRIG 59 07/12/2014   CHOLHDL 2.3 07/12/2014   He has been working on diet and exercise for prediabetes, states he has been eating a few M&M's after supper but otherwise his weight is down and denies paresthesia of the feet, polydipsia, polyuria and visual disturbances. Last A1C in the office was:  Lab Results  Component Value Date   HGBA1C 5.8* 07/12/2014   Patient is on Vitamin D supplement.   Lab Results  Component Value Date   VD25OH 69 07/12/2014     His father passed and his wife, Martha's father passed and this has increased stressed and has been having to deal with their houses.    Current Medications:  Current Outpatient Prescriptions on File Prior to Visit   Medication Sig Dispense Refill  . ALPRAZolam (XANAX) 0.5 MG tablet Take 0.5 mg by mouth 3 (three) times daily as needed for anxiety.    Marland Kitchen atorvastatin (LIPITOR) 20 MG tablet Take 1 tablet (20 mg total) by mouth daily. 90 tablet 0  . augmented betamethasone dipropionate (DIPROLENE-AF) 0.05 % cream     . Cholecalciferol (VITAMIN D PO) Take 4,000 Int'l Units by mouth.    . Fexofenadine HCl (ALLEGRA PO) Take by mouth daily.    . fluticasone (FLONASE) 50 MCG/ACT nasal spray Place 2 sprays into both nostrils daily.    . lansoprazole (PREVACID) 30 MG capsule Take 30 mg by mouth daily at 12 noon.    Marland Kitchen lisinopril (PRINIVIL,ZESTRIL) 20 MG tablet Take 20 mg by mouth daily.    . montelukast (SINGULAIR) 10 MG tablet Take 10 mg by mouth at bedtime.     No current facility-administered medications on file prior to visit.   Medical History:  Past Medical History  Diagnosis Date  . Hyperlipidemia   . Hypertension   . Vitamin D deficiency   . Allergy   . Prediabetes   . Asthma    Allergies:  Allergies  Allergen Reactions  . Hismanal [Astemizole]   . Latex   . Penicillins Hives     Review of Systems: [X]  = complains of  [ ]  = denies  General: Fatigue [ ]  Fever [ ]  Chills [ ]  Weakness [ ]   Insomnia [ ]  Eyes: Redness [ ]   Blurred vision [ ]  Diplopia [ ]   ENT: Congestion [ ]  Sinus Pain [ ]  Post Nasal Drip [ ]  Sore Throat [ ]  Earache [ ]   Cardiac: Chest pain/pressure [ ]  SOB [ ]  Orthopnea [ ]   Palpitations [ ]   Paroxysmal nocturnal dyspnea[ ]  Claudication [ ]  Edema [ ]   Pulmonary: Cough [ ]  Wheezing[ ]   SOB [ ]   Snoring [ ]   GI: Nausea [ ]  Vomiting[ ]  Dysphagia[ ]  Heartburn[ ]  Abdominal pain [ ]  Constipation [ ] ; Diarrhea [ ] ; BRBPR [ ]  Melena[ ]  GU: Hematuria[ ]  Dysuria [ ]  Nocturia[ ]  Urgency [ ]   Hesitancy [ ]  Discharge [ ]  Neuro: Headaches[ ]  Vertigo[ ]  Paresthesias[ ]  Spasm [ ]  Speech changes [ ]  Incoordination [ ]   Ortho: Arthritis [ ]  Joint pain [ ]  Muscle pain [ ]  Joint swelling [ ]  Back  Pain [ ]  Skin:  Rash [ ]   Pruritis [ ]  Change in skin lesion [ ]   Psych: Depression[ ]  Anxiety[ ]  Confusion [ ]  Memory loss [ ]   Heme/Lypmh: Bleeding [ ]  Bruising [ ]  Enlarged lymph nodes [ ]   Endocrine: Visual blurring [ ]  Paresthesia [ ]  Polyuria [ ]  Polydypsea [ ]    Heat/cold intolerance [ ]  Hypoglycemia [ ]   Family history- Review and unchanged Social history- Review and unchanged Physical Exam: BP 118/70 mmHg  Pulse 64  Temp(Src) 97.7 F (36.5 C)  Resp 16  Ht 5\' 8"  (1.727 m)  Wt 180 lb (81.647 kg)  BMI 27.38 kg/m2 Wt Readings from Last 3 Encounters:  10/25/14 180 lb (81.647 kg)  07/16/14 182 lb (82.555 kg)  01/21/14 182 lb (82.555 kg)   General Appearance: Well nourished, in no apparent distress. Eyes: PERRLA, EOMs, conjunctiva no swelling or erythema Sinuses: No Frontal/maxillary tenderness ENT/Mouth: Ext aud canals clear, TMs without erythema, bulging. No erythema, swelling, or exudate on post pharynx.  Tonsils not swollen or erythematous. Hearing normal.  Neck: Supple, thyroid normal.  Respiratory: Respiratory effort normal, BS equal bilaterally without rales, rhonchi, wheezing or stridor.  Cardio: RRR with no MRGs. Brisk peripheral pulses without edema.  Abdomen: Soft, + BS.  Non tender, no guarding, rebound, hernias, masses. Lymphatics: Non tender without lymphadenopathy.  Musculoskeletal: Full ROM, 5/5 strength, normal gait.  Skin: Warm, dry without rashes, lesions, ecchymosis.  Neuro: Cranial nerves intact. Normal muscle tone, no cerebellar symptoms. Sensation intact.  Psych: Awake and oriented X 3, normal affect, Insight and Judgment appropriate.    Vicie Mutters, PA-C 9:12 AM [facility]

## 2014-10-26 LAB — BASIC METABOLIC PANEL WITH GFR
BUN: 16 mg/dL (ref 6–23)
CO2: 25 meq/L (ref 19–32)
Calcium: 9.5 mg/dL (ref 8.4–10.5)
Chloride: 102 mEq/L (ref 96–112)
Creat: 0.93 mg/dL (ref 0.50–1.35)
GFR, Est African American: >89 mL/min
GFR, Est Non African American: 88 mL/min
Glucose, Bld: 101 mg/dL — ABNORMAL HIGH (ref 70–99)
POTASSIUM: 4.5 meq/L (ref 3.5–5.3)
SODIUM: 138 meq/L (ref 135–145)

## 2014-10-26 LAB — LIPID PANEL
CHOL/HDL RATIO: 2.4 ratio
Cholesterol: 137 mg/dL (ref 0–200)
HDL: 57 mg/dL (ref >39)
LDL Cholesterol: 68 mg/dL (ref 0–99)
Triglycerides: 58 mg/dL (ref <150)
VLDL: 12 mg/dL (ref 0–40)

## 2014-10-26 LAB — HEPATIC FUNCTION PANEL
ALK PHOS: 55 U/L (ref 39–117)
ALT: 25 U/L (ref 0–53)
AST: 23 U/L (ref 0–37)
Albumin: 4.1 g/dL (ref 3.5–5.2)
BILIRUBIN DIRECT: 0.2 mg/dL (ref 0.0–0.3)
BILIRUBIN INDIRECT: 0.6 mg/dL (ref 0.2–1.2)
BILIRUBIN TOTAL: 0.8 mg/dL (ref 0.2–1.2)
Total Protein: 6.9 g/dL (ref 6.0–8.3)

## 2014-10-26 LAB — TSH: TSH: 1.374 u[IU]/mL (ref 0.350–4.500)

## 2014-10-26 LAB — VITAMIN D 25 HYDROXY (VIT D DEFICIENCY, FRACTURES): Vit D, 25-Hydroxy: 71 ng/mL (ref 30–89)

## 2014-10-26 LAB — INSULIN, FASTING: Insulin fasting, serum: 7.2 u[IU]/mL (ref 2.0–19.6)

## 2014-10-26 LAB — MAGNESIUM: Magnesium: 2 mg/dL (ref 1.5–2.5)

## 2014-10-27 ENCOUNTER — Other Ambulatory Visit: Payer: Self-pay | Admitting: Internal Medicine

## 2014-10-27 ENCOUNTER — Encounter: Payer: Self-pay | Admitting: Physician Assistant

## 2014-10-27 DIAGNOSIS — I10 Essential (primary) hypertension: Secondary | ICD-10-CM

## 2014-10-27 MED ORDER — LISINOPRIL 20 MG PO TABS: ORAL_TABLET | ORAL | Status: DC

## 2014-11-03 ENCOUNTER — Encounter: Payer: Self-pay | Admitting: Physician Assistant

## 2014-11-03 ENCOUNTER — Other Ambulatory Visit: Payer: Self-pay | Admitting: Internal Medicine

## 2014-11-03 MED ORDER — LANSOPRAZOLE 30 MG PO CPDR: 30.0000 mg | DELAYED_RELEASE_CAPSULE | Freq: Every day | ORAL | Status: DC

## 2014-11-29 ENCOUNTER — Other Ambulatory Visit: Payer: Self-pay | Admitting: Physician Assistant

## 2014-12-02 ENCOUNTER — Encounter: Payer: Self-pay | Admitting: Physician Assistant

## 2014-12-27 ENCOUNTER — Encounter: Payer: Self-pay | Admitting: *Deleted

## 2015-01-09 ENCOUNTER — Encounter: Payer: Self-pay | Admitting: Physician Assistant

## 2015-01-09 ENCOUNTER — Ambulatory Visit (INDEPENDENT_AMBULATORY_CARE_PROVIDER_SITE_OTHER): Payer: 59 | Admitting: Physician Assistant

## 2015-01-09 VITALS — BP 126/84 | HR 82 | Temp 98.0°F | Resp 16 | Ht 68.0 in | Wt 180.0 lb

## 2015-01-09 DIAGNOSIS — J0100 Acute maxillary sinusitis, unspecified: Secondary | ICD-10-CM

## 2015-01-09 MED ORDER — PROMETHAZINE-DM 6.25-15 MG/5ML PO SYRP: 5.0000 mL | ORAL_SOLUTION | Freq: Four times a day (QID) | ORAL | Status: DC | PRN

## 2015-01-09 MED ORDER — PREDNISONE 10 MG PO TABS: ORAL_TABLET | ORAL | Status: AC

## 2015-01-09 MED ORDER — AZITHROMYCIN 250 MG PO TABS: ORAL_TABLET | ORAL | Status: AC

## 2015-01-09 NOTE — Progress Notes (Signed)
HPI  A Caucasian 64 y.o.male presents to the office today due to sore throat and sinus issues that started 5 days ago.  He states he is getting gradually better, but wanted to come in before the weekend.  He has no sick contacts.  He has tried Sudafed, Ibuprofen and Tylenol with no relief.  Patient has a history of asthma and takes Singulair in the Spring and has Flonase at home.  Never smoked.  He reports congestion, ear pain, sinus pressure, rhinorrhea, dental pain, voice change, non-productive cough and wheezing.  He denies fever, chills sweats, fatigue, post-nasal drip, trouble swallowing, SOB, CP, abdominal pain, nausea, vomiting, diarrhea, constipation, dizziness, lightheadedness and headaches.  Review of Systems  All other systems reviewed and are negative.  Past Medical History-  Past Medical History  Diagnosis Date  . Hyperlipidemia   . Hypertension   . Vitamin D deficiency   . Allergy   . Prediabetes   . Asthma    Medications-  Current Outpatient Prescriptions on File Prior to Visit  Medication Sig Dispense Refill  . ALPRAZolam (XANAX) 0.5 MG tablet Take 0.5 mg by mouth 3 (three) times daily as needed for anxiety.    Marland Kitchen atorvastatin (LIPITOR) 20 MG tablet Take 1 tablet (20 mg total) by mouth daily. 90 tablet 0  . Cholecalciferol (VITAMIN D PO) Take 4,000 Int'l Units by mouth.    . lansoprazole (PREVACID) 30 MG capsule Take 1 capsule (30 mg total) by mouth daily at 12 noon. 90 capsule 1  . lisinopril (PRINIVIL,ZESTRIL) 20 MG tablet Take 1 tablet daily for BP 90 tablet 0  . Fexofenadine HCl (ALLEGRA PO) Take by mouth daily.    . fluticasone (FLONASE) 50 MCG/ACT nasal spray USE 2 SPRAYS IN EACH       NOSTRIL DAILY (Patient not taking: Reported on 01/09/2015) 48 g 3  . montelukast (SINGULAIR) 10 MG tablet Take 10 mg by mouth at bedtime.     No current facility-administered medications on file prior to visit.   Allergies-  Allergies  Allergen Reactions  . Hismanal [Astemizole]    . Latex   . Penicillins Hives   Physical Exam BP 126/84 mmHg  Pulse 82  Temp(Src) 98 F (36.7 C) (Temporal)  Resp 16  Ht 5\' 8"  (1.727 m)  Wt 180 lb (81.647 kg)  BMI 27.38 kg/m2  SpO2 96% Wt Readings from Last 3 Encounters:  01/09/15 180 lb (81.647 kg)  10/25/14 180 lb (81.647 kg)  07/16/14 182 lb (82.555 kg)  Vitals Reviewed. General Appearance: Well nourished, in no apparent distress and had pleasant demeanor. Eyes:  PERRLA. EOMI. Conjunctiva is pink without edema, erythema or yellowing.  No scleral icterus. Sinuses: Maxillary tenderness upon palpation.  No Frontal tenderness. Ears: No erythema, edema or tenderness on both external ear cartilages and ear canals.  TMs are intact bilaterally with normal light reflexes and bulging with clear fluid.  TMs are non-erythematous and non-edematous bilaterally. Nose: Nose is symmetrical and turbinates are erythematous and edematous bilaterally.    No polyps or paleness. Rhinorrhea present. Throat: Oral pharynx is pink and moist.  Mildly erythematous and non-edematous posterior pharynx.  Mucosa is intact and without lesions.  Tonsils are at +1 station bilaterally and do not have exudate.    Uvula is midline and not swollen. Neck: Supple,  LAD, trachea is midline. Full range of motion in neck intact Respiratory: CTAB,  r/r/w or stridor. No increased effort of breathing. Cardio: RRR.   m/r/g.  S1S2nl.  Pulses B/L +2  Abdomen: Symmetrical, soft, nontender, and flat.  +BS nl x4.   Skin: Warm, dry, intact without rashes, lesions, ecchymosis, yellowing, cyanosis.  Neuro: Alert and oriented X3, cooperative.  Mood and affect appropriate to situation.  CN II-XII grossly intact.  Psych: Insight and Judgment appropriate.   Assessment and Plan 1. Acute maxillary sinusitis, recurrence not specified - azithromycin (ZITHROMAX) 250 MG tablet; Take 2 tablets PO on day 1, then 1 tablet PO Q24H x 4 days  Dispense: 6 tablet; Refill: 1 - predniSONE  (DELTASONE) 10 MG tablet; Take 3 tablets PO for 2 days, then take 2 tablets PO for 2 days, then take 1 tablet PO for 3 days.  Dispense: 13 tablet; Refill: 0 - promethazine-dextromethorphan (PROMETHAZINE-DM) 6.25-15 MG/5ML syrup; Take 5 mLs by mouth 4 (four) times daily as needed for cough.  Dispense: 180 mL; Refill: 0 Continue Singulair and Flonase as prescribed.  Discussed medication effects and SE's.  Pt agreed to treatment plan. If you are not feeling better in 10-14 days, then please call the office. Please keep your physical appt on 07/17/15.  Tamirra Sienkiewicz, Stephani Police, PA-C 10:31 AM [facility]

## 2015-01-09 NOTE — Patient Instructions (Signed)
-Take z-Pak as prescribed. -Take prednisone as prescribed for inflammation. -Take Promethazine-DM as prescribed for cough.  Can make you sleepy. -Please continue Flonase. Please take Singulair until better. While drinking fluids, pinch and hold nose closed and swallow to help open eustachian tubes to drain fluid behind ear drums.    Please drink water to stay hydrated.  If you are not feeling better in 10-14 days, then please call the office.  Sinusitis Sinusitis is redness, soreness, and inflammation of the paranasal sinuses. Paranasal sinuses are air pockets within the bones of your face (beneath the eyes, the middle of the forehead, or above the eyes). In healthy paranasal sinuses, mucus is able to drain out, and air is able to circulate through them by way of your nose. However, when your paranasal sinuses are inflamed, mucus and air can become trapped. This can allow bacteria and other germs to grow and cause infection. Sinusitis can develop quickly and last only a short time (acute) or continue over a long period (chronic). Sinusitis that lasts for more than 12 weeks is considered chronic.  CAUSES  Causes of sinusitis include:  Allergies.  Structural abnormalities, such as displacement of the cartilage that separates your nostrils (deviated septum), which can decrease the air flow through your nose and sinuses and affect sinus drainage.  Functional abnormalities, such as when the small hairs (cilia) that line your sinuses and help remove mucus do not work properly or are not present. SIGNS AND SYMPTOMS  Symptoms of acute and chronic sinusitis are the same. The primary symptoms are pain and pressure around the affected sinuses. Other symptoms include:  Upper toothache.  Earache.  Headache.  Bad breath.  Decreased sense of smell and taste.  A cough, which worsens when you are lying flat.  Fatigue.  Fever.  Thick drainage from your nose, which often is green and may  contain pus (purulent).  Swelling and warmth over the affected sinuses. DIAGNOSIS  Your health care provider will perform a physical exam. During the exam, your health care provider may:  Look in your nose for signs of abnormal growths in your nostrils (nasal polyps).  Tap over the affected sinus to check for signs of infection.  View the inside of your sinuses (endoscopy) using an imaging device that has a light attached (endoscope). If your health care provider suspects that you have chronic sinusitis, one or more of the following tests may be recommended:  Allergy tests.  Nasal culture. A sample of mucus is taken from your nose, sent to a lab, and screened for bacteria.  Nasal cytology. A sample of mucus is taken from your nose and examined by your health care provider to determine if your sinusitis is related to an allergy. TREATMENT  Most cases of acute sinusitis are related to a viral infection and will resolve on their own within 10 days. Sometimes medicines are prescribed to help relieve symptoms (pain medicine, decongestants, nasal steroid sprays, or saline sprays).  However, for sinusitis related to a bacterial infection, your health care provider will prescribe antibiotic medicines. These are medicines that will help kill the bacteria causing the infection.  Rarely, sinusitis is caused by a fungal infection. In theses cases, your health care provider will prescribe antifungal medicine. For some cases of chronic sinusitis, surgery is needed. Generally, these are cases in which sinusitis recurs more than 3 times per year, despite other treatments. HOME CARE INSTRUCTIONS   Drink plenty of water. Water helps thin the mucus so your  sinuses can drain more easily.  Use a humidifier.  Inhale steam 3 to 4 times a day (for example, sit in the bathroom with the shower running).  Apply a warm, moist washcloth to your face 3 to 4 times a day, or as directed by your health care  provider.  Use saline nasal sprays to help moisten and clean your sinuses.  Take medicines only as directed by your health care provider.  If you were prescribed either an antibiotic or antifungal medicine, finish it all even if you start to feel better. SEEK IMMEDIATE MEDICAL CARE IF:  You have increasing pain or severe headaches.  You have nausea, vomiting, or drowsiness.  You have swelling around your face.  You have vision problems.  You have a stiff neck.  You have difficulty breathing. MAKE SURE YOU:   Understand these instructions.  Will watch your condition.  Will get help right away if you are not doing well or get worse. Document Released: 12/06/2005 Document Revised: 04/22/2014 Document Reviewed: 12/21/2011 [facility] Patient Information 2015 [place], [place]. This information is not intended to replace advice given to you by your health care provider. Make sure you discuss any questions you have with your health care provider.

## 2015-02-24 ENCOUNTER — Other Ambulatory Visit: Payer: Self-pay

## 2015-02-24 ENCOUNTER — Encounter: Payer: Self-pay | Admitting: Physician Assistant

## 2015-02-24 MED ORDER — MONTELUKAST SODIUM 10 MG PO TABS: 10.0000 mg | ORAL_TABLET | Freq: Every day | ORAL | Status: DC

## 2015-02-26 ENCOUNTER — Other Ambulatory Visit: Payer: Self-pay

## 2015-02-26 MED ORDER — AZELASTINE HCL 0.05 % OP SOLN: 1.0000 [drp] | Freq: Two times a day (BID) | OPHTHALMIC | Status: DC | PRN

## 2015-02-26 MED ORDER — PREDNISOLONE ACETATE 1 % OP SUSP: 1.0000 [drp] | Freq: Four times a day (QID) | OPHTHALMIC | Status: DC | PRN

## 2015-04-20 ENCOUNTER — Other Ambulatory Visit: Payer: Self-pay | Admitting: Internal Medicine

## 2015-04-20 DIAGNOSIS — I10 Essential (primary) hypertension: Secondary | ICD-10-CM

## 2015-04-20 MED ORDER — LISINOPRIL 20 MG PO TABS: ORAL_TABLET | ORAL | Status: DC

## 2015-04-22 ENCOUNTER — Encounter: Payer: Self-pay | Admitting: *Deleted

## 2015-05-04 ENCOUNTER — Encounter: Payer: Self-pay | Admitting: Physician Assistant

## 2015-05-05 ENCOUNTER — Other Ambulatory Visit: Payer: Self-pay

## 2015-05-05 MED ORDER — LANSOPRAZOLE 30 MG PO CPDR: 30.0000 mg | DELAYED_RELEASE_CAPSULE | Freq: Every day | ORAL | Status: DC

## 2015-05-26 ENCOUNTER — Encounter: Payer: Self-pay | Admitting: Internal Medicine

## 2015-05-26 ENCOUNTER — Ambulatory Visit (INDEPENDENT_AMBULATORY_CARE_PROVIDER_SITE_OTHER): Payer: 59 | Admitting: Internal Medicine

## 2015-05-26 DIAGNOSIS — E785 Hyperlipidemia, unspecified: Secondary | ICD-10-CM

## 2015-05-26 DIAGNOSIS — W57XXXA Bitten or stung by nonvenomous insect and other nonvenomous arthropods, initial encounter: Secondary | ICD-10-CM

## 2015-05-26 DIAGNOSIS — T148 Other injury of unspecified body region: Secondary | ICD-10-CM

## 2015-05-26 MED ORDER — DOXYCYCLINE HYCLATE 100 MG PO CAPS: 100.0000 mg | ORAL_CAPSULE | Freq: Two times a day (BID) | ORAL | Status: DC

## 2015-05-26 NOTE — Progress Notes (Signed)
   Subjective:    Patient ID: Barry Flynn, male    DOB: [birth year], 64 y.o.   MRN: [id number]  Rash Pertinent negatives include no fatigue, fever or vomiting.   Patient presents to the office for evaluation of rash after multiple tick bites that happened 2 weeks ago.  He is concerned for rocky mountain spotted fever and lyme disease.  He reports that the rash is mildly red and non-itchy surrounding 1 of the bites that he obtained.  He has not tried anything for it.  He reports that nothing is an aggravating factor.      Review of Systems  Constitutional: Negative for fever, chills and fatigue.  Gastrointestinal: Negative for nausea and vomiting.  Musculoskeletal: Negative for myalgias and arthralgias.  Skin: Positive for rash.  Neurological: Negative for dizziness, light-headedness, numbness and headaches.       Objective:   Physical Exam  Constitutional: He is oriented to person, place, and time. He appears well-developed and well-nourished. No distress.  HENT:  Head: Normocephalic and atraumatic.  Mouth/Throat: No oropharyngeal exudate.  Eyes: Conjunctivae are normal. No scleral icterus.  Neck: Normal range of motion. Neck supple. No JVD present. No thyromegaly present.  Cardiovascular: Normal rate, regular rhythm, normal heart sounds and intact distal pulses.  Exam reveals no gallop and no friction rub.   No murmur heard. Pulmonary/Chest: Effort normal and breath sounds normal. No respiratory distress. He has no wheezes. He has no rales. He exhibits no tenderness.  Musculoskeletal: Normal range of motion.  Lymphadenopathy:    He has no cervical adenopathy.  Neurological: He is alert and oriented to person, place, and time.  Skin: Skin is warm and dry. He is not diaphoretic.     Psychiatric: He has a normal mood and affect. His behavior is normal. Judgment and thought content normal.  Nursing note and vitals reviewed.         Assessment & Plan:   1. Tick  bite -doxycycline -return to office for flu like syndrome. -blood work is not required at this time.

## 2015-05-26 NOTE — Patient Instructions (Signed)
Tick Bite Information Ticks are insects that attach themselves to the skin and draw blood for food. There are various types of ticks. Common types include wood ticks and deer ticks. Most ticks live in shrubs and grassy areas. Ticks can climb onto your body when you make contact with leaves or grass where the tick is waiting. The most common places on the body for ticks to attach themselves are the scalp, neck, armpits, waist, and groin. Most tick bites are harmless, but sometimes ticks carry germs that cause diseases. These germs can be spread to a person during the tick's feeding process. The chance of a disease spreading through a tick bite depends on:   The type of tick.  Time of year.   How long the tick is attached.   Geographic location.  HOW CAN YOU PREVENT TICK BITES? Take these steps to help prevent tick bites when you are outdoors:  Wear protective clothing. Long sleeves and long pants are best.   Wear white clothes so you can see ticks more easily.  Tuck your pant legs into your socks.   If walking on a trail, stay in the middle of the trail to avoid brushing against bushes.  Avoid walking through areas with long grass.  Put insect repellent on all exposed skin and along boot tops, pant legs, and sleeve cuffs.   Check clothing, hair, and skin repeatedly and before going inside.   Brush off any ticks that are not attached.  Take a shower or bath as soon as possible after being outdoors.  WHAT IS THE PROPER WAY TO REMOVE A TICK? Ticks should be removed as soon as possible to help prevent diseases caused by tick bites. 1. If latex gloves are available, put them on before trying to remove a tick.  2. Using fine-point tweezers, grasp the tick as close to the skin as possible. You may also use curved forceps or a tick removal tool. Grasp the tick as close to its head as possible. Avoid grasping the tick on its body. 3. Pull gently with steady upward pressure until  the tick lets go. Do not twist the tick or jerk it suddenly. This may break off the tick's head or mouth parts. 4. Do not squeeze or crush the tick's body. This could force disease-carrying fluids from the tick into your body.  5. After the tick is removed, wash the bite area and your hands with soap and water or other disinfectant such as alcohol. 6. Apply a small amount of antiseptic cream or ointment to the bite site.  7. Wash and disinfect any instruments that were used.  Do not try to remove a tick by applying a hot match, petroleum jelly, or fingernail polish to the tick. These methods do not work and may increase the chances of disease being spread from the tick bite.  WHEN SHOULD YOU SEEK MEDICAL CARE? Contact your health care provider if you are unable to remove a tick from your skin or if a part of the tick breaks off and is stuck in the skin.  After a tick bite, you need to be aware of signs and symptoms that could be related to diseases spread by ticks. Contact your health care provider if you develop any of the following in the days or weeks after the tick bite:  Unexplained fever.  Rash. A circular rash that appears days or weeks after the tick bite may indicate the possibility of Lyme disease. The rash may resemble   a target with a bull's-eye and may occur at a different part of your body than the tick bite.  Redness and swelling in the area of the tick bite.   Tender, swollen lymph glands.   Diarrhea.   Weight loss.   Cough.   Fatigue.   Muscle, joint, or bone pain.   Abdominal pain.   Headache.   Lethargy or a change in your level of consciousness.  Difficulty walking or moving your legs.   Numbness in the legs.   Paralysis.  Shortness of breath.   Confusion.   Repeated vomiting.  Document Released: 12/03/2000 Document Revised: 09/26/2013 Document Reviewed: 05/16/2013 [employer] Patient Information 2015 [employer]. This information is  not intended to replace advice given to you by your health care provider. Make sure you discuss any questions you have with your health care provider.  

## 2015-06-14 ENCOUNTER — Emergency Department (HOSPITAL_COMMUNITY): Payer: 59

## 2015-06-14 ENCOUNTER — Emergency Department (HOSPITAL_COMMUNITY)
Admission: EM | Admit: 2015-06-14 | Discharge: 2015-06-15 | Disposition: A | Discharge status: Home / Self Care | Payer: 59 | Attending: Emergency Medicine | Admitting: Emergency Medicine

## 2015-06-14 ENCOUNTER — Encounter (HOSPITAL_COMMUNITY): Payer: Self-pay | Admitting: Emergency Medicine

## 2015-06-14 DIAGNOSIS — Z91040 Latex allergy status: Secondary | ICD-10-CM | POA: Insufficient documentation

## 2015-06-14 DIAGNOSIS — Z79899 Other long term (current) drug therapy: Secondary | ICD-10-CM | POA: Diagnosis not present

## 2015-06-14 DIAGNOSIS — R112 Nausea with vomiting, unspecified: Secondary | ICD-10-CM

## 2015-06-14 DIAGNOSIS — Z880 Allergy status to penicillin: Secondary | ICD-10-CM | POA: Diagnosis not present

## 2015-06-14 DIAGNOSIS — E785 Hyperlipidemia, unspecified: Secondary | ICD-10-CM | POA: Insufficient documentation

## 2015-06-14 DIAGNOSIS — Z7951 Long term (current) use of inhaled steroids: Secondary | ICD-10-CM | POA: Diagnosis not present

## 2015-06-14 DIAGNOSIS — R531 Weakness: Secondary | ICD-10-CM | POA: Diagnosis not present

## 2015-06-14 DIAGNOSIS — E559 Vitamin D deficiency, unspecified: Secondary | ICD-10-CM | POA: Insufficient documentation

## 2015-06-14 DIAGNOSIS — J45901 Unspecified asthma with (acute) exacerbation: Secondary | ICD-10-CM | POA: Diagnosis not present

## 2015-06-14 DIAGNOSIS — R0602 Shortness of breath: Secondary | ICD-10-CM

## 2015-06-14 DIAGNOSIS — I10 Essential (primary) hypertension: Secondary | ICD-10-CM | POA: Insufficient documentation

## 2015-06-14 DIAGNOSIS — R61 Generalized hyperhidrosis: Secondary | ICD-10-CM | POA: Diagnosis not present

## 2015-06-14 LAB — CBC
HCT: 45.7 % (ref 39.0–52.0)
Hemoglobin: 16.2 g/dL (ref 13.0–17.0)
MCH: 31.8 pg (ref 26.0–34.0)
MCHC: 35.4 g/dL (ref 30.0–36.0)
MCV: 89.6 fL (ref 78.0–100.0)
PLATELETS: 286 10*3/uL (ref 150–400)
RBC: 5.1 MIL/uL (ref 4.22–5.81)
RDW: 12.8 % (ref 11.5–15.5)
WBC: 20.7 10*3/uL — ABNORMAL HIGH (ref 4.0–10.5)

## 2015-06-14 LAB — I-STAT TROPONIN, ED: TROPONIN I, POC: 0 ng/mL (ref 0.00–0.08)

## 2015-06-14 NOTE — ED Notes (Addendum)
Pt arrives via [facility] with c/o intermittent nausea and SHOB, states sudden onset pale and diaphoretic. States he was watching tv and of a sudden felt really weak, states he had to crawl to phone to call EMS. Sinus brady on EMS EKG. Pt given 4MG  of zofran by EMS. Negative orthostatics.

## 2015-06-14 NOTE — ED Provider Notes (Signed)
CSN: [id number]     Arrival date & time 06/14/15  2301 History   First MD Initiated Contact with Patient 06/14/15 2311     Chief Complaint  Patient presents with  . Nausea  . Shortness of Breath     (Consider location/radiation/quality/duration/timing/severity/associated sxs/prior Treatment) HPI Comments: 64 y/o M PMHx HLD, HTN, asthma and prediabetes presenting via EMS with sudden onset nausea and vomiting beginning just before 8 PM this evening, 30 minutes after eating dinner while watching TV. Had chicken for dinner, the same as the rest of his family. Admits to four episodes of non-bloody emesis, felt short of breath described as a sensation that he could not get air in, with associated diaphoresis and weakness. States he had to crawl to the phone to call EMS. Symptoms lasted a couple hours until EMS arrived and gave him zofran. Currently states he is feeling better. Denies ever having symptoms like this in the past. Denies chest pain, abdominal pain, fever, chills, diarrhea, numbness, tingling, headache, lightheadedness or dizziness. No personal hx of ACS. His father has hx of MI. Non-smoker.  Patient is a 64 y.o. male presenting with shortness of breath. The history is provided by the patient, the EMS personnel and the spouse.  Shortness of Breath Associated symptoms: diaphoresis     Past Medical History  Diagnosis Date  . Hyperlipidemia   . Hypertension   . Vitamin D deficiency   . Allergy   . Prediabetes   . Asthma    Past Surgical History  Procedure Laterality Date  . Knee arthroscopy Right 1982, 1999  . Splenectomy    . Elbow arthroscopy with tendon reconstruction Left 2013   Family History  Problem Relation Age of Onset  . Alzheimer's disease Mother   . Heart disease Father   . Dementia Father    History  Substance Use Topics  . Smoking status: Never Smoker   . Smokeless tobacco: Not on file  . Alcohol Use: 2.5 oz/week    5 drink(s) per week     Comment: Wine     Review of Systems  Constitutional: Positive for diaphoresis.  Respiratory: Positive for shortness of breath.   Gastrointestinal: Positive for nausea.  Neurological: Positive for weakness.  All other systems reviewed and are negative.     Allergies  Hismanal; Latex; and Penicillins  Home Medications   Prior to Admission medications   Medication Sig Start Date End Date Taking? Authorizing Provider  ALPRAZolam Duanne Moron) 0.5 MG tablet Take 0.5 mg by mouth 3 (three) times daily as needed for anxiety.    Historical Provider, MD  atorvastatin (LIPITOR) 20 MG tablet Take 1 tablet (20 mg total) by mouth daily. 09/30/14   Vicie Mutters, PA-C  azelastine (OPTIVAR) 0.05 % ophthalmic solution Place 1 drop into both eyes 2 (two) times daily as needed. 02/26/15   Unk Pinto, MD  Cholecalciferol (VITAMIN D PO) Take 4,000 Int'l Units by mouth.    Historical Provider, MD  doxycycline (VIBRAMYCIN) 100 MG capsule Take 1 capsule (100 mg total) by mouth 2 (two) times daily. One po bid x 14 days 05/26/15   Starlyn Skeans, PA-C  Fexofenadine HCl (ALLEGRA PO) Take by mouth daily.    Historical Provider, MD  fluticasone [facility]) 50 MCG/ACT nasal spray USE 2 SPRAYS IN [facility]       NOSTRIL DAILY 12/01/14   Vicie Mutters, PA-C  lansoprazole (PREVACID) 30 MG capsule Take 1 capsule (30 mg total) by mouth daily at 12 noon. 05/05/15  Unk Pinto, MD  lisinopril (PRINIVIL,ZESTRIL) 20 MG tablet Take 1 tablet daily for BP 04/20/15 07/21/15  Unk Pinto, MD  montelukast (SINGULAIR) 10 MG tablet Take 1 tablet (10 mg total) by mouth at bedtime. 02/24/15   Vicie Mutters, PA-C  prednisoLONE acetate (PRED FORTE) 1 % ophthalmic suspension Place 1 drop into both eyes 4 (four) times daily as needed. 02/26/15   Unk Pinto, MD   BP 131/70 mmHg  Pulse 66  Temp(Src) 98.8 F (37.1 C) (Oral)  Resp 13  Ht 5\' 6"  (1.676 m)  Wt 170 lb (77.111 kg)  BMI 27.45 kg/m2  SpO2 100% Physical Exam  Constitutional: He is  oriented to person, place, and time. He appears well-developed and well-nourished. No distress.  HENT:  Head: Normocephalic and atraumatic.  Mouth/Throat: Oropharynx is clear and moist.  Eyes: Conjunctivae and EOM are normal. Pupils are equal, round, and reactive to light.  Neck: Normal range of motion. Neck supple. No JVD present.  Cardiovascular: Normal rate, regular rhythm, normal heart sounds and intact distal pulses.   No extremity edema.  Pulmonary/Chest: Effort normal and breath sounds normal. No respiratory distress.  Abdominal: Soft. Bowel sounds are normal. There is no tenderness.  Musculoskeletal: Normal range of motion. He exhibits no edema.  Neurological: He is alert and oriented to person, place, and time. He has normal strength. No sensory deficit.  Speech fluent, goal oriented. Moves extremities without ataxia. Equal grip strength bilateral.  Skin: Skin is warm and dry. He is not diaphoretic.  Psychiatric: He has a normal mood and affect. His behavior is normal.  Nursing note and vitals reviewed.   ED Course  Procedures (including critical care time) Labs Review Labs Reviewed  CBC - Abnormal; Notable for the following:    WBC 20.7 (*)    All other components within normal limits  BASIC METABOLIC PANEL - Abnormal; Notable for the following:    Glucose, Bld 107 (*)    BUN 21 (*)    All other components within normal limits  I-STAT TROPOININ, ED    Imaging Review Dg Chest 2 View  06/15/2015   CLINICAL DATA:  Acute onset of generalized chest pain and weakness. Initial encounter.  EXAM: CHEST  2 VIEW  COMPARISON:  Chest radiograph from 10/14/2008  FINDINGS: The lungs are well-aerated and clear. There is no evidence of focal opacification, pleural effusion or pneumothorax.  The heart is normal in size; the mediastinal contour is within normal limits. No acute osseous abnormalities are seen.  IMPRESSION: No acute cardiopulmonary process seen.   Electronically Signed   By:  Garald Balding M.D.   On: 06/15/2015 00:10     EKG Interpretation   Date/Time:  Saturday June 14 2015 23:14:51 EDT Ventricular Rate:  57 PR Interval:  143 QRS Duration: 102 QT Interval:  445 QTC Calculation: 433 R Axis:   7 Text Interpretation:  Sinus rhythm Low voltage, precordial leads Confirmed  by HARRISON  MD, FORREST ([id number]) on 06/15/2015 12:17:06 AM      MDM   Final diagnoses:  Non-intractable vomiting with nausea, vomiting of unspecified type  Shortness of breath   Non-toxic appearing, NAD. AFVSS. Atypical story for ACS, HEART score 3, troponin negative, EKG without acute findings. Workup significant for leukocytosis of 20.7, no other acute findings. Symptoms beginning 30 minutes after eating, no chest pain at any point, had nausea with vomiting, and a brief episode of shortness of breath. Asymptomatic here in the ED. Plan to obtain delta troponin  and dispo pending that result. Pt does not want to be admitted. If delta trop negative, pt can be discharged home with close PCP f/u. Pt signed out to [facility], PA-C at shift change.  Discussed with attending Dr. Aline Brochure who also evaluated patient and agrees with plan of care.  Carman Ching, PA-C 06/15/15 [id number]  Pamella Pert, MD 06/15/15 1200

## 2015-06-15 ENCOUNTER — Emergency Department (HOSPITAL_COMMUNITY): Payer: 59

## 2015-06-15 ENCOUNTER — Encounter (HOSPITAL_COMMUNITY): Payer: Self-pay | Admitting: Radiology

## 2015-06-15 LAB — BASIC METABOLIC PANEL
Anion gap: 11 (ref 5–15)
BUN: 21 mg/dL — AB (ref 6–20)
CO2: 23 mmol/L (ref 22–32)
Calcium: 9.6 mg/dL (ref 8.9–10.3)
Chloride: 103 mmol/L (ref 101–111)
Creatinine, Ser: 1.06 mg/dL (ref 0.61–1.24)
GLUCOSE: 107 mg/dL — AB (ref 65–99)
Potassium: 4.4 mmol/L (ref 3.5–5.1)
Sodium: 137 mmol/L (ref 135–145)

## 2015-06-15 LAB — I-STAT TROPONIN, ED: Troponin i, poc: 0 ng/mL (ref 0.00–0.08)

## 2015-06-15 MED ORDER — IOHEXOL 300 MG/ML  SOLN
25.0000 mL | Freq: Once | INTRAMUSCULAR | Status: AC | PRN
  Administered 2015-06-15: 25 mL via ORAL

## 2015-06-15 MED ORDER — PROMETHAZINE HCL 25 MG/ML IJ SOLN
25.0000 mg | Freq: Once | INTRAMUSCULAR | Status: AC
  Administered 2015-06-15: 25 mg via INTRAVENOUS
  Filled 2015-06-15: qty 1

## 2015-06-15 MED ORDER — ONDANSETRON HCL 4 MG/2ML IJ SOLN
4.0000 mg | Freq: Once | INTRAMUSCULAR | Status: AC
  Administered 2015-06-15: 4 mg via INTRAVENOUS

## 2015-06-15 MED ORDER — PROMETHAZINE HCL 25 MG PO TABS: 25.0000 mg | ORAL_TABLET | Freq: Four times a day (QID) | ORAL | Status: DC | PRN

## 2015-06-15 MED ORDER — IOHEXOL 300 MG/ML  SOLN
100.0000 mL | Freq: Once | INTRAMUSCULAR | Status: AC | PRN
  Administered 2015-06-15: 100 mL via INTRAVENOUS

## 2015-06-15 NOTE — ED Notes (Signed)
Pt ambulated to bathroom with RN standby assist, initially unsteady on his feet but reoriented himself and ambulated independently.

## 2015-06-15 NOTE — ED Provider Notes (Signed)
12:50 AM Patient signed out to me by Lucien Mons, PA-C. Patient pending delta troponin at 2:30am. If negative, patient will be discharged with outpatient follow up.   3:40 AM Patient began complaining of LLQ abdominal pain and persistent nausea. Patient had CT abdomen pelvis to rule out infectious process which was negative for any acute changes. Patient feeling much better after IV phenergan and was able to pass PO challenge. Patient feels comfortable going home. Patient instructed to return with worsening or concerning symptoms. Patient expresses understanding. Patient will have phenergan prescription.   Results for orders placed or performed during the hospital encounter of 06/14/15  CBC  Result Value Ref Range   WBC 20.7 (H) 4.0 - 10.5 K/uL   RBC 5.10 4.22 - 5.81 MIL/uL   Hemoglobin 16.2 13.0 - 17.0 g/dL   HCT 45.7 39.0 - 52.0 %   MCV 89.6 78.0 - 100.0 fL   MCH 31.8 26.0 - 34.0 pg   MCHC 35.4 30.0 - 36.0 g/dL   RDW 12.8 11.5 - 15.5 %   Platelets 286 150 - 400 K/uL  Basic metabolic panel  Result Value Ref Range   Sodium 137 135 - 145 mmol/L   Potassium 4.4 3.5 - 5.1 mmol/L   Chloride 103 101 - 111 mmol/L   CO2 23 22 - 32 mmol/L   Glucose, Bld 107 (H) 65 - 99 mg/dL   BUN 21 (H) 6 - 20 mg/dL   Creatinine, Ser 1.06 0.61 - 1.24 mg/dL   Calcium 9.6 8.9 - 10.3 mg/dL   GFR calc non Af Amer >60 >60 mL/min   GFR calc Af Amer >60 >60 mL/min   Anion gap 11 5 - 15  I-stat troponin, ED  Result Value Ref Range   Troponin i, poc 0.00 0.00 - 0.08 ng/mL   Comment 3          I-stat troponin, ED  Result Value Ref Range   Troponin i, poc 0.00 0.00 - 0.08 ng/mL   Comment 3           Dg Chest 2 View  06/15/2015   CLINICAL DATA:  Acute onset of generalized chest pain and weakness. Initial encounter.  EXAM: CHEST  2 VIEW  COMPARISON:  Chest radiograph from 10/14/2008  FINDINGS: The lungs are well-aerated and clear. There is no evidence of focal opacification, pleural effusion or pneumothorax.  The  heart is normal in size; the mediastinal contour is within normal limits. No acute osseous abnormalities are seen.  IMPRESSION: No acute cardiopulmonary process seen.   Electronically Signed   By: Garald Balding M.D.   On: 06/15/2015 00:10   Ct Abdomen Pelvis W Contrast  06/15/2015   CLINICAL DATA:  Acute onset of nausea and vomiting. Diaphoresis and weakness. Initial encounter.  EXAM: CT ABDOMEN AND PELVIS WITH CONTRAST  TECHNIQUE: Multidetector CT imaging of the abdomen and pelvis was performed using the standard protocol following bolus administration of intravenous contrast.  CONTRAST:  19mL OMNIPAQUE IOHEXOL 300 MG/ML  SOLN  COMPARISON:  None.  FINDINGS: Minimal bibasilar atelectasis is noted.  The liver and spleen are unremarkable in appearance. The gallbladder is within normal limits. The pancreas and right adrenal gland are unremarkable. A 1.0 cm hypodensity is noted within the left adrenal gland.  Nonspecific perinephric stranding is noted bilaterally. Scattered left renal parapelvic cysts are seen. There is no evidence of hydronephrosis. No obstructing ureteral stones are seen. A 5 mm stone is noted near the upper pole of  the right kidney, and multiple left renal stones are seen, measuring up to 8 mm in size.  No free fluid is identified. The small bowel is unremarkable in appearance. The stomach is within normal limits. No acute vascular abnormalities are seen. Minimal calcification is noted along the distal abdominal aorta and its branches.  The appendix is not definitely seen; there is no evidence for appendicitis. The colon is partially filled with fluid and is unremarkable in appearance. A likely calcified epiploic appendage is noted adjacent to the distal sigmoid colon.  The bladder is mildly distended and grossly unremarkable. The prostate remains normal in size. No inguinal lymphadenopathy is seen.  No acute osseous abnormalities are identified.  IMPRESSION: 1. No acute abnormality seen to  explain the patient's symptoms. 2. Bilateral nonobstructing renal stones noted, more prominent on the left. Scattered left renal parapelvic cysts seen. No evidence of hydronephrosis. 3. Small nonspecific 1.0 cm hypodensity within the left adrenal gland. This could be further assessed on adrenal protocol MRI or CT, as deemed clinically appropriate.   Electronically Signed   By: Garald Balding M.D.   On: 06/15/2015 03:30      Alvina Chou, PA-C 06/15/15 [phone]

## 2015-06-15 NOTE — Discharge Instructions (Signed)
Nausea and Vomiting Nausea is a sick feeling that often comes before throwing up (vomiting). Vomiting is a reflex where stomach contents come out of your mouth. Vomiting can cause severe loss of body fluids (dehydration). Children and elderly adults can become dehydrated quickly, especially if they also have diarrhea. Nausea and vomiting are symptoms of a condition or disease. It is important to find the cause of your symptoms. CAUSES   Direct irritation of the stomach lining. This irritation can result from increased acid production (gastroesophageal reflux disease), infection, food poisoning, taking certain medicines (such as nonsteroidal anti-inflammatory drugs), alcohol use, or tobacco use.  Signals from the brain.These signals could be caused by a headache, heat exposure, an inner ear disturbance, increased pressure in the brain from injury, infection, a tumor, or a concussion, pain, emotional stimulus, or metabolic problems.  An obstruction in the gastrointestinal tract (bowel obstruction).  Illnesses such as diabetes, hepatitis, gallbladder problems, appendicitis, kidney problems, cancer, sepsis, atypical symptoms of a heart attack, or eating disorders.  Medical treatments such as chemotherapy and radiation.  Receiving medicine that makes you sleep (general anesthetic) during surgery. DIAGNOSIS Your caregiver may ask for tests to be done if the problems do not improve after a few days. Tests may also be done if symptoms are severe or if the reason for the nausea and vomiting is not clear. Tests may include:  Urine tests.  Blood tests.  Stool tests.  Cultures (to look for evidence of infection).  X-rays or other imaging studies. Test results can help your caregiver make decisions about treatment or the need for additional tests. TREATMENT You need to stay well hydrated. Drink frequently but in small amounts.You may wish to drink water, sports drinks, clear broth, or eat frozen  ice pops or gelatin dessert to help stay hydrated.When you eat, eating slowly may help prevent nausea.There are also some antinausea medicines that may help prevent nausea. HOME CARE INSTRUCTIONS   Take all medicine as directed by your caregiver.  If you do not have an appetite, do not force yourself to eat. However, you must continue to drink fluids.  If you have an appetite, eat a normal diet unless your caregiver tells you differently.  Eat a variety of complex carbohydrates (rice, wheat, potatoes, bread), lean meats, yogurt, fruits, and vegetables.  Avoid high-fat foods because they are more difficult to digest.  Drink enough water and fluids to keep your urine clear or pale yellow.  If you are dehydrated, ask your caregiver for specific rehydration instructions. Signs of dehydration may include:  Severe thirst.  Dry lips and mouth.  Dizziness.  Dark urine.  Decreasing urine frequency and amount.  Confusion.  Rapid breathing or pulse. SEEK IMMEDIATE MEDICAL CARE IF:   You have blood or brown flecks (like coffee grounds) in your vomit.  You have black or bloody stools.  You have a severe headache or stiff neck.  You are confused.  You have severe abdominal pain.  You have chest pain or trouble breathing.  You do not urinate at least once every 8 hours.  You develop cold or clammy skin.  You continue to vomit for longer than 24 to 48 hours.  You have a fever. MAKE SURE YOU:   Understand these instructions.  Will watch your condition.  Will get help right away if you are not doing well or get worse. Document Released: 12/06/2005 Document Revised: 02/28/2012 Document Reviewed: 05/05/2011 [employer] Patient Information 2015 [employer]. This information is not intended   to replace advice given to you by your health care provider. Make sure you discuss any questions you have with your health care provider.  Shortness of Breath Shortness of breath means  you have trouble breathing. It could also mean that you have a medical problem. You should get immediate medical care for shortness of breath. CAUSES   Not enough oxygen in the air such as with high altitudes or a smoke-filled room.  Certain lung diseases, infections, or problems.  Heart disease or conditions, such as angina or heart failure.  Low red blood cells (anemia).  Poor physical fitness, which can cause shortness of breath when you exercise.  Chest or back injuries or stiffness.  Being overweight.  Smoking.  Anxiety, which can make you feel like you are not getting enough air. DIAGNOSIS  Serious medical problems can often be found during your physical exam. Tests may also be done to determine why you are having shortness of breath. Tests may include:  Chest X-rays.  Lung function tests.  Blood tests.  An electrocardiogram (ECG).  An ambulatory electrocardiogram. An ambulatory ECG records your heartbeat patterns over a 24-hour period.  Exercise testing.  A transthoracic echocardiogram (TTE). During echocardiography, sound waves are used to evaluate how blood flows through your heart.  A transesophageal echocardiogram (TEE).  Imaging scans. Your health care provider may not be able to find a cause for your shortness of breath after your exam. In this case, it is important to have a follow-up exam with your health care provider as directed.  TREATMENT  Treatment for shortness of breath depends on the cause of your symptoms and can vary greatly. HOME CARE INSTRUCTIONS   Do not smoke. Smoking is a common cause of shortness of breath. If you smoke, ask for help to quit.  Avoid being around chemicals or things that may bother your breathing, such as paint fumes and dust.  Rest as needed. Slowly resume your usual activities.  If medicines were prescribed, take them as directed for the full length of time directed. This includes oxygen and any inhaled  medicines.  Keep all follow-up appointments as directed by your health care provider. SEEK MEDICAL CARE IF:   Your condition does not improve in the time expected.  You have a hard time doing your normal activities even with rest.  You have any new symptoms. SEEK IMMEDIATE MEDICAL CARE IF:   Your shortness of breath gets worse.  You feel light-headed, faint, or develop a cough not controlled with medicines.  You start coughing up blood.  You have pain with breathing.  You have chest pain or pain in your arms, shoulders, or abdomen.  You have a fever.  You are unable to walk up stairs or exercise the way you normally do. MAKE SURE YOU:  Understand these instructions.  Will watch your condition.  Will get help right away if you are not doing well or get worse. Document Released: 08/31/2001 Document Revised: 12/11/2013 Document Reviewed: 02/21/2012 [facility] Patient Information 2015 [place], [place]. This information is not intended to replace advice given to you by your health care provider. Make sure you discuss any questions you have with your health care provider.

## 2015-06-15 NOTE — ED Notes (Signed)
Pt called out stating nausea is back, pale and diaphoretic. HR up to 80s. EKG performed, showed to Dr. Claudine Mouton. Informed Oni and Robbins PA of pt's change in condition. PA to come see pt.

## 2015-07-17 ENCOUNTER — Ambulatory Visit (INDEPENDENT_AMBULATORY_CARE_PROVIDER_SITE_OTHER): Payer: 59 | Admitting: Physician Assistant

## 2015-07-17 ENCOUNTER — Encounter: Payer: Self-pay | Admitting: Physician Assistant

## 2015-07-17 VITALS — BP 116/78 | HR 50 | Temp 98.2°F | Resp 18 | Ht 67.5 in | Wt 171.0 lb

## 2015-07-17 DIAGNOSIS — Z1212 Encounter for screening for malignant neoplasm of rectum: Secondary | ICD-10-CM

## 2015-07-17 DIAGNOSIS — Z0001 Encounter for general adult medical examination with abnormal findings: Secondary | ICD-10-CM

## 2015-07-17 DIAGNOSIS — E785 Hyperlipidemia, unspecified: Secondary | ICD-10-CM

## 2015-07-17 DIAGNOSIS — F418 Other specified anxiety disorders: Secondary | ICD-10-CM | POA: Insufficient documentation

## 2015-07-17 DIAGNOSIS — Z0000 Encounter for general adult medical examination without abnormal findings: Secondary | ICD-10-CM | POA: Diagnosis not present

## 2015-07-17 DIAGNOSIS — I10 Essential (primary) hypertension: Secondary | ICD-10-CM | POA: Diagnosis not present

## 2015-07-17 DIAGNOSIS — E559 Vitamin D deficiency, unspecified: Secondary | ICD-10-CM

## 2015-07-17 DIAGNOSIS — T7840XD Allergy, unspecified, subsequent encounter: Secondary | ICD-10-CM

## 2015-07-17 DIAGNOSIS — H9190 Unspecified hearing loss, unspecified ear: Secondary | ICD-10-CM | POA: Insufficient documentation

## 2015-07-17 DIAGNOSIS — H9193 Unspecified hearing loss, bilateral: Secondary | ICD-10-CM

## 2015-07-17 DIAGNOSIS — Z79899 Other long term (current) drug therapy: Secondary | ICD-10-CM

## 2015-07-17 DIAGNOSIS — J45909 Unspecified asthma, uncomplicated: Secondary | ICD-10-CM

## 2015-07-17 DIAGNOSIS — R7303 Prediabetes: Secondary | ICD-10-CM

## 2015-07-17 DIAGNOSIS — N400 Enlarged prostate without lower urinary tract symptoms: Secondary | ICD-10-CM

## 2015-07-17 DIAGNOSIS — R001 Bradycardia, unspecified: Secondary | ICD-10-CM

## 2015-07-17 LAB — CBC WITH DIFFERENTIAL/PLATELET
Basophils Absolute: 0.1 10*3/uL (ref 0.0–0.1)
Basophils Relative: 1 % (ref 0–1)
Eosinophils Absolute: 0.3 10*3/uL (ref 0.0–0.7)
Eosinophils Relative: 4 % (ref 0–5)
HCT: 45.5 % (ref 39.0–52.0)
HEMOGLOBIN: 15.6 g/dL (ref 13.0–17.0)
LYMPHS ABS: 2.7 10*3/uL (ref 0.7–4.0)
Lymphocytes Relative: 43 % (ref 12–46)
MCH: 31.4 pg (ref 26.0–34.0)
MCHC: 34.3 g/dL (ref 30.0–36.0)
MCV: 91.5 fL (ref 78.0–100.0)
MONO ABS: 0.5 10*3/uL (ref 0.1–1.0)
MPV: 9.6 fL (ref 8.6–12.4)
Monocytes Relative: 8 % (ref 3–12)
Neutro Abs: 2.8 10*3/uL (ref 1.7–7.7)
Neutrophils Relative %: 44 % (ref 43–77)
PLATELETS: 286 10*3/uL (ref 150–400)
RBC: 4.97 MIL/uL (ref 4.22–5.81)
RDW: 13.8 % (ref 11.5–15.5)
WBC: 6.3 10*3/uL (ref 4.0–10.5)
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
## 2015-07-17 NOTE — Patient Instructions (Signed)
GETTING OFF OF PPI's    Nexium/protonix/prilosec/Omeprazole/Dexilant/Aciphex are called PPI's, they are great at healing your stomach but should only be taken for a short period of time.     Recent studies have shown that taken for a long time they  can increase the risk of osteoporosis (weakening of your bones), pneumonia, low magnesium, restless legs, Cdiff (infection that causes diarrhea), DEMENTIA and most recently kidney damage / disease / insufficiency.     Due to this information we want to try to stop the PPI but if you try to stop it abruptly this can cause rebound acid and worsening symptoms.   So this is how we want you to get off the PPI:  - Start taking the nexium/protonix/prilosec/PPI  every other day with  zantac (ranitidine) 300mg  2-3 x a day for 2-4 weeks  - then decrease the PPI to every 3 days while taking the zantac (ranitidine) twice a day the other  days for 2-4  Weeks  - then you can try the zantac (ranitidine) once at night or up to 2 x day as needed.  - you can continue on this once at night or stop all together  - Avoid alcohol, spicy foods, NSAIDS (aleve, ibuprofen) at this time. See foods below.   +++++++++++++++++++++++++++++++++++++++++++  Food Choices for Gastroesophageal Reflux Disease  When you have gastroesophageal reflux disease (GERD), the foods you eat and your eating habits are very important. Choosing the right foods can help ease the discomfort of GERD. WHAT GENERAL GUIDELINES DO I NEED TO FOLLOW?  Choose fruits, vegetables, whole grains, low-fat dairy products, and low-fat meat, fish, and poultry.  Limit fats such as oils, salad dressings, butter, nuts, and avocado.  Keep a food diary to identify foods that cause symptoms.  Avoid foods that cause reflux. These may be different for different people.  Eat frequent small meals instead of three large meals each day.  Eat your meals slowly, in a relaxed setting.  Limit fried foods.  Cook  foods using methods other than frying.  Avoid drinking alcohol.  Avoid drinking large amounts of liquids with your meals.  Avoid bending over or lying down until 2-3 hours after eating.   WHAT FOODS ARE NOT RECOMMENDED? The following are some foods and drinks that may worsen your symptoms:  Vegetables Tomatoes. Tomato juice. Tomato and spaghetti sauce. Chili peppers. Onion and garlic. Horseradish. Fruits Oranges, grapefruit, and lemon (fruit and juice). Meats High-fat meats, fish, and poultry. This includes hot dogs, ribs, ham, sausage, salami, and bacon. Dairy Whole milk and chocolate milk. Sour cream. Cream. Butter. Ice cream. Cream cheese.  Beverages Coffee and tea, with or without caffeine. Carbonated beverages or energy drinks. Condiments Hot sauce. Barbecue sauce.  Sweets/Desserts Chocolate and cocoa. Donuts. Peppermint and spearmint. Fats and Oils High-fat foods, including [place] fries and potato chips. Other Vinegar. Strong spices, such as black pepper, white pepper, red pepper, cayenne, curry powder, cloves, ginger, and chili powder. Nexium/protonix/prilosec are called PPI's, they are great at healing your stomach but should only be taken for a short period of time.

## 2015-07-18 LAB — INSULIN, FASTING: Insulin fasting, serum: 5.2 u[IU]/mL (ref 2.0–19.6)

## 2015-07-18 LAB — URINALYSIS, ROUTINE W REFLEX MICROSCOPIC
Bilirubin Urine: NEGATIVE
GLUCOSE, UA: NEGATIVE
HGB URINE DIPSTICK: NEGATIVE
KETONES UR: NEGATIVE
Leukocytes, UA: NEGATIVE
NITRITE: NEGATIVE
PH: 6.5 (ref 5.0–8.0)
Protein, ur: NEGATIVE
Specific Gravity, Urine: 1.019 (ref 1.001–1.035)

## 2015-07-18 LAB — LIPID PANEL
CHOLESTEROL: 126 mg/dL (ref 125–200)
HDL: 55 mg/dL (ref >=40)
LDL Cholesterol: 60 mg/dL (ref <130)
TRIGLYCERIDES: 54 mg/dL (ref <150)
Total CHOL/HDL Ratio: 2.3 Ratio (ref <=5.0)
VLDL: 11 mg/dL (ref <30)

## 2015-07-18 LAB — TSH: TSH: 1.537 u[IU]/mL (ref 0.350–4.500)

## 2015-07-18 LAB — MICROALBUMIN / CREATININE URINE RATIO
Creatinine, Urine: 153.7 mg/dL
Microalb Creat Ratio: 5.2 mg/g (ref 0.0–30.0)
Microalb, Ur: 0.8 mg/dL (ref <2.0)

## 2015-07-18 LAB — MAGNESIUM: Magnesium: 2 mg/dL (ref 1.5–2.5)

## 2015-07-18 LAB — VITAMIN D 25 HYDROXY (VIT D DEFICIENCY, FRACTURES): Vit D, 25-Hydroxy: 50 ng/mL (ref 30–100)

## 2015-07-18 LAB — HEPATIC FUNCTION PANEL
ALK PHOS: 56 U/L (ref 40–115)
ALT: 26 U/L (ref 9–46)
AST: 23 U/L (ref 10–35)
Albumin: 4.3 g/dL (ref 3.6–5.1)
BILIRUBIN DIRECT: 0.2 mg/dL (ref <=0.2)
BILIRUBIN INDIRECT: 0.7 mg/dL (ref 0.2–1.2)
TOTAL PROTEIN: 6.7 g/dL (ref 6.1–8.1)
Total Bilirubin: 0.9 mg/dL (ref 0.2–1.2)

## 2015-07-18 LAB — BASIC METABOLIC PANEL WITH GFR
BUN: 15 mg/dL (ref 7–25)
CALCIUM: 9.6 mg/dL (ref 8.6–10.3)
CO2: 26 mEq/L (ref 20–31)
CREATININE: 0.9 mg/dL (ref 0.70–1.25)
Chloride: 102 mEq/L (ref 98–110)
GFR, Est African American: >89 mL/min (ref >=60)
GFR, Est Non African American: >89 mL/min (ref >=60)
Glucose, Bld: 86 mg/dL (ref 65–99)
Potassium: 4.1 mEq/L (ref 3.5–5.3)
Sodium: 139 mEq/L (ref 135–146)

## 2015-07-18 LAB — HEMOGLOBIN A1C
Hgb A1c MFr Bld: 5.8 % — ABNORMAL HIGH (ref <5.7)
MEAN PLASMA GLUCOSE: 120 mg/dL — AB (ref <117)

## 2015-07-29 ENCOUNTER — Other Ambulatory Visit: Payer: Self-pay | Admitting: *Deleted

## 2015-07-29 DIAGNOSIS — Z1212 Encounter for screening for malignant neoplasm of rectum: Secondary | ICD-10-CM

## 2015-07-29 LAB — POC HEMOCCULT BLD/STL (HOME/3-CARD/SCREEN)
Card #2 Fecal Occult Blod, POC: NEGATIVE
FECAL OCCULT BLD: NEGATIVE
FECAL OCCULT BLD: NEGATIVE

## 2015-09-29 ENCOUNTER — Encounter: Payer: Self-pay | Admitting: Physician Assistant

## 2015-09-29 MED ORDER — ATORVASTATIN CALCIUM 20 MG PO TABS: 20.0000 mg | ORAL_TABLET | Freq: Every day | ORAL | Status: DC

## 2015-10-23 ENCOUNTER — Ambulatory Visit (INDEPENDENT_AMBULATORY_CARE_PROVIDER_SITE_OTHER): Payer: 59 | Admitting: *Deleted

## 2015-10-23 ENCOUNTER — Encounter: Payer: Self-pay | Admitting: Physician Assistant

## 2015-10-23 ENCOUNTER — Ambulatory Visit (INDEPENDENT_AMBULATORY_CARE_PROVIDER_SITE_OTHER): Payer: 59 | Admitting: Physician Assistant

## 2015-10-23 VITALS — BP 110/80 | HR 61 | Temp 97.3°F | Resp 16 | Ht 67.5 in | Wt 177.0 lb

## 2015-10-23 DIAGNOSIS — I10 Essential (primary) hypertension: Secondary | ICD-10-CM | POA: Diagnosis not present

## 2015-10-23 DIAGNOSIS — Z79899 Other long term (current) drug therapy: Secondary | ICD-10-CM | POA: Diagnosis not present

## 2015-10-23 DIAGNOSIS — E559 Vitamin D deficiency, unspecified: Secondary | ICD-10-CM

## 2015-10-23 DIAGNOSIS — R7303 Prediabetes: Secondary | ICD-10-CM

## 2015-10-23 DIAGNOSIS — E785 Hyperlipidemia, unspecified: Secondary | ICD-10-CM | POA: Diagnosis not present

## 2015-10-23 DIAGNOSIS — Z23 Encounter for immunization: Secondary | ICD-10-CM | POA: Diagnosis not present

## 2015-10-23 LAB — HEMOGLOBIN A1C
Hgb A1c MFr Bld: 5.3 % (ref <5.7)
Mean Plasma Glucose: 105 mg/dL (ref <117)

## 2015-10-23 LAB — CBC WITH DIFFERENTIAL/PLATELET
BASOS ABS: 0.1 10*3/uL (ref 0.0–0.1)
Basophils Relative: 1 % (ref 0–1)
EOS ABS: 0.3 10*3/uL (ref 0.0–0.7)
Eosinophils Relative: 4 % (ref 0–5)
HCT: 44.7 % (ref 39.0–52.0)
Hemoglobin: 15.4 g/dL (ref 13.0–17.0)
LYMPHS PCT: 34 % (ref 12–46)
Lymphs Abs: 2.2 10*3/uL (ref 0.7–4.0)
MCH: 31.6 pg (ref 26.0–34.0)
MCHC: 34.5 g/dL (ref 30.0–36.0)
MCV: 91.6 fL (ref 78.0–100.0)
MPV: 9.8 fL (ref 8.6–12.4)
Monocytes Absolute: 0.7 10*3/uL (ref 0.1–1.0)
Monocytes Relative: 10 % (ref 3–12)
NEUTROS PCT: 51 % (ref 43–77)
Neutro Abs: 3.4 10*3/uL (ref 1.7–7.7)
PLATELETS: 290 10*3/uL (ref 150–400)
RBC: 4.88 MIL/uL (ref 4.22–5.81)
RDW: 13.1 % (ref 11.5–15.5)
WBC: 6.6 10*3/uL (ref 4.0–10.5)

## 2015-10-23 LAB — MAGNESIUM: MAGNESIUM: 2.1 mg/dL (ref 1.5–2.5)

## 2015-10-23 LAB — LIPID PANEL
CHOLESTEROL: 124 mg/dL — AB (ref 125–200)
HDL: 59 mg/dL (ref >=40)
LDL Cholesterol: 48 mg/dL (ref <130)
Total CHOL/HDL Ratio: 2.1 Ratio (ref <=5.0)
Triglycerides: 84 mg/dL (ref <150)
VLDL: 17 mg/dL (ref <30)

## 2015-10-23 LAB — BASIC METABOLIC PANEL WITH GFR
BUN: 15 mg/dL (ref 7–25)
CALCIUM: 9.5 mg/dL (ref 8.6–10.3)
CO2: 27 mmol/L (ref 20–31)
Chloride: 103 mmol/L (ref 98–110)
Creat: 1.08 mg/dL (ref 0.70–1.25)
GFR, EST AFRICAN AMERICAN: 84 mL/min (ref >=60)
GFR, EST NON AFRICAN AMERICAN: 73 mL/min (ref >=60)
GLUCOSE: 87 mg/dL (ref 65–99)
Potassium: 4.3 mmol/L (ref 3.5–5.3)
SODIUM: 137 mmol/L (ref 135–146)

## 2015-10-23 LAB — HEPATIC FUNCTION PANEL
ALT: 27 U/L (ref 9–46)
AST: 22 U/L (ref 10–35)
Albumin: 4.2 g/dL (ref 3.6–5.1)
Alkaline Phosphatase: 58 U/L (ref 40–115)
BILIRUBIN DIRECT: 0.2 mg/dL (ref <=0.2)
BILIRUBIN INDIRECT: 0.7 mg/dL (ref 0.2–1.2)
BILIRUBIN TOTAL: 0.9 mg/dL (ref 0.2–1.2)
TOTAL PROTEIN: 6.7 g/dL (ref 6.1–8.1)

## 2015-10-23 LAB — TSH: TSH: 1.293 u[IU]/mL (ref 0.350–4.500)

## 2015-10-23 MED ORDER — LANSOPRAZOLE 30 MG PO CPDR: 30.0000 mg | DELAYED_RELEASE_CAPSULE | Freq: Every day | ORAL | Status: DC

## 2015-10-23 MED ORDER — INFLUENZA VIRUS VACC SPLIT PF (FLUZONE) 0.5 ML IM SUSY: 0.5000 mL | PREFILLED_SYRINGE | Freq: Once | INTRAMUSCULAR | Status: DC

## 2015-10-23 NOTE — Patient Instructions (Signed)

## 2015-10-23 NOTE — Progress Notes (Signed)
Assessment and Plan:  1. Hypertension -Continue medication, monitor blood pressure at home. Continue DASH diet.  Reminder to go to the ER if any CP, SOB, nausea, dizziness, severe HA, changes vision/speech, left arm numbness and tingling and jaw pain.  2. Cholesterol -Continue diet and exercise. Check cholesterol.   3. Prediabetes  -Continue diet and exercise. Check A1C  4. Vitamin D Def - check level and continue medications.   Continue diet and meds as discussed. Further disposition pending results of labs. Over 30 minutes of exam, counseling, chart review, and critical decision making was performed Future Appointments Date Time Provider [place]  07/20/2016 10:00 AM Vicie Mutters, PA-C [id number] None    HPI 64 y.o. male  presents for 3 month follow up on hypertension, cholesterol, prediabetes, and vitamin D deficiency.   His blood pressure has been controlled at home, today their BP is BP: 110/80 mmHg  He does workout. He denies chest pain, shortness of breath, dizziness.  He is on cholesterol medication and denies myalgias. His cholesterol is at goal. The cholesterol last visit was:   Lab Results  Component Value Date   CHOL 126 07/17/2015   HDL 55 07/17/2015   LDLCALC 60 07/17/2015   TRIG 54 07/17/2015   CHOLHDL 2.3 07/17/2015    He has been working on diet and exercise for prediabetes, and denies paresthesia of the feet, polydipsia, polyuria and visual disturbances. Last A1C in the office was:  Lab Results  Component Value Date   HGBA1C 5.8* 07/17/2015   Patient is on Vitamin D supplement.   Lab Results  Component Value Date   VD25OH 50 07/17/2015      Current Medications:  Current Outpatient Prescriptions on File Prior to Visit  Medication Sig Dispense Refill  . ALPRAZolam (XANAX) 0.5 MG tablet Take 0.5 mg by mouth 3 (three) times daily as needed for anxiety.    Marland Kitchen atorvastatin (LIPITOR) 20 MG tablet Take 1 tablet (20 mg total) by mouth daily. 90 tablet  3  . azelastine (OPTIVAR) 0.05 % ophthalmic solution Place 1 drop into both eyes 2 (two) times daily as needed. 6 mL PRN  . Cholecalciferol (VITAMIN D PO) Take 4,000 Int'l Units by mouth.    . Fexofenadine HCl (ALLEGRA PO) Take by mouth daily.    . fluticasone (FLONASE) 50 MCG/ACT nasal spray USE 2 SPRAYS IN EACH       NOSTRIL DAILY 48 g 3  . montelukast (SINGULAIR) 10 MG tablet Take 1 tablet (10 mg total) by mouth at bedtime. 90 tablet 3  . prednisoLONE acetate (PRED FORTE) 1 % ophthalmic suspension Place 1 drop into both eyes 4 (four) times daily as needed. 5 mL PRN  . lisinopril (PRINIVIL,ZESTRIL) 20 MG tablet Take 1 tablet daily for BP 90 tablet 0   No current facility-administered medications on file prior to visit.   Medical History:  Past Medical History  Diagnosis Date  . Hyperlipidemia   . Hypertension   . Vitamin D deficiency   . Allergy   . Prediabetes   . Asthma    Allergies:  Allergies  Allergen Reactions  . Hismanal [Astemizole]   . Latex   . Penicillins Hives     Review of Systems:  ROS  Family history- Review and unchanged Social history- Review and unchanged Physical Exam: BP 110/80 mmHg  Pulse 61  Temp(Src) 97.3 F (36.3 C) (Temporal)  Resp 16  Ht 5' 7.5" (1.715 m)  Wt 177 lb (80.287 kg)  BMI  27.30 kg/m2  SpO2 97% Wt Readings from Last 3 Encounters:  10/23/15 177 lb (80.287 kg)  07/17/15 171 lb (77.565 kg)  06/14/15 170 lb (77.111 kg)   General Appearance: Well nourished, in no apparent distress. Eyes: PERRLA, EOMs, conjunctiva no swelling or erythema Sinuses: No Frontal/maxillary tenderness ENT/Mouth: Ext aud canals clear, TMs without erythema, bulging. No erythema, swelling, or exudate on post pharynx.  Tonsils not swollen or erythematous. Hearing normal.  Neck: Supple, thyroid normal.  Respiratory: Respiratory effort normal, BS equal bilaterally without rales, rhonchi, wheezing or stridor.  Cardio: RRR with no MRGs. Brisk peripheral pulses  without edema.  Abdomen: Soft, + BS,  Non tender, no guarding, rebound, hernias, masses. Lymphatics: Non tender without lymphadenopathy.  Musculoskeletal: Full ROM, 5/5 strength, Normal gait Skin: Warm, dry without rashes, lesions, ecchymosis.  Neuro: Cranial nerves intact. Normal muscle tone, no cerebellar symptoms. Psych: Awake and oriented X 3, normal affect, Insight and Judgment appropriate.    Vicie Mutters, PA-C 8:37 AM [facility]

## 2015-10-24 LAB — VITAMIN D 25 HYDROXY (VIT D DEFICIENCY, FRACTURES): Vit D, 25-Hydroxy: 58 ng/mL (ref 30–100)

## 2015-10-30 ENCOUNTER — Ambulatory Visit (INDEPENDENT_AMBULATORY_CARE_PROVIDER_SITE_OTHER): Payer: 59 | Admitting: Internal Medicine

## 2015-10-30 ENCOUNTER — Encounter: Payer: Self-pay | Admitting: Internal Medicine

## 2015-10-30 VITALS — BP 136/72 | HR 84 | Temp 99.8°F | Resp 16 | Ht 67.5 in | Wt 179.0 lb

## 2015-10-30 DIAGNOSIS — Z6827 Body mass index (BMI) 27.0-27.9, adult: Secondary | ICD-10-CM | POA: Diagnosis not present

## 2015-10-30 DIAGNOSIS — J209 Acute bronchitis, unspecified: Secondary | ICD-10-CM

## 2015-10-30 DIAGNOSIS — J0140 Acute pansinusitis, unspecified: Secondary | ICD-10-CM | POA: Diagnosis not present

## 2015-10-30 MED ORDER — AZITHROMYCIN 250 MG PO TABS: ORAL_TABLET | ORAL | Status: DC

## 2015-10-30 MED ORDER — PREDNISONE 20 MG PO TABS: ORAL_TABLET | ORAL | Status: DC

## 2015-10-30 MED ORDER — HYDROCODONE-ACETAMINOPHEN 5-325 MG PO TABS: ORAL_TABLET | ORAL | Status: DC

## 2015-10-30 NOTE — Patient Instructions (Signed)

## 2015-11-01 ENCOUNTER — Encounter: Payer: Self-pay | Admitting: Internal Medicine

## 2015-11-01 DIAGNOSIS — E663 Overweight: Secondary | ICD-10-CM | POA: Insufficient documentation

## 2015-11-01 DIAGNOSIS — Z6827 Body mass index (BMI) 27.0-27.9, adult: Secondary | ICD-10-CM | POA: Insufficient documentation

## 2015-11-01 NOTE — Progress Notes (Signed)
Subjective:    Patient ID: Barry Flynn, male    DOB: 1951-09-11, 64 y.o.   MRN: [id number]  HPI  Very nice 64 yo MWM presenting with ST, head & chest congestion for 7-10 days exacerbating over the last 4-5 days with increasing head / chest congestion , fever and denies any chills, sweats,  dyspnea or rash.   Medication Sig  . ALPRAZolam0.5 MG tablet Take 0.5 mg by mouth 3 (three) times daily as needed for anxiety.  Marland Kitchen atorvastatin  20 MG tablet Take 1 tablet (20 mg total) by mouth daily.  . OPTIVAR 0.05 % ophth soln Place 1 drop into both eyes 2 (two) times daily as needed.  Marland Kitchen VITAMIN D  Take 4,000 Int'l Units by mouth.  Delma Freeze  Take by mouth daily.  Marland Kitchen FLONASE nasal spray USE 2 SPRAYS IN EACH       NOSTRIL DAILY  . lansoprazole  30 MG capsule Take 1 capsule (30 mg total) by mouth daily at 12 noon.  Marland Kitchen lisinopril  20 MG tablet Take 1 tablet daily for BP  . montelukast 10 MG tablet Take 1 tablet (10 mg total) by mouth at bedtime.  Marland Kitchen PRED FORTE 1 % ophth susp Place 1 drop into both eyes 4 (four) times daily as needed.   Allergies  Allergen Reactions  . Hismanal [Astemizole]   . Latex   . Penicillins Hives   Past Medical History  Diagnosis Date  . Hyperlipidemia   . Hypertension   . Vitamin D deficiency   . Allergy   . Prediabetes   . Asthma    Past Surgical History  Procedure Laterality Date  . Knee arthroscopy Right 1982, 1999  . Splenectomy    . Elbow arthroscopy with tendon reconstruction Left 2013   Review of Systems  10 point systems review negative except as above.    Objective:   Physical Exam  BP 136/72 mmHg  Pulse 84  Temp(Src) 99.8 F (37.7 C) (Temporal)  Resp 16  Ht 5' 7.5" (1.715 m)  Wt 179 lb (81.194 kg)  BMI 27.61 kg/m2  No respiratory stridor. (+) Congested brassy cough.   HEENT - Eac's patent. TM's Nl. EOM's full. PERRLA. (+) tender frontal/maxillary sinuses areas. NasoOroPharynx clear. Neck - supple. Nl Thyroid. Carotids 2+ & No bruits, nodes,  JVD Chest - Scattred coarse  Rales & rhonchi, no wheezes. Cor - Nl HS. RRR w/o sig MGR. PP 1(+). No edema. Abd - No palpable organomegaly, masses or tenderness. BS nl. MS- FROM w/o deformities. Muscle power, tone and bulk Nl. Gait Nl. Neuro - No obvious Cr N abnormalities. Sensory, motor and Cerebellar functions appear Nl w/o focal abnormalities. Psyche - Mental status normal & appropriate.  No delusions, ideations or obvious mood abnormalities. Skin - No rash, cyanosis, icterus.    Assessment & Plan:   1. Subacute pansinusitis   2. Acute bronchitis, unspecified organism  - HYDROcodone-acetaminophen (NORCO) 5-325 MG tablet; Take 1/2 to 1 tablet every 3 to 4 hours for severe cough or pain  Dispense: 50 tablet; Refill: 0 - predniSONE (DELTASONE) 20 MG tablet; 1 tab 3 x day for 3 days, then 1 tab 2 x day for 3 days, then 1 tab 1 x day for 5 days  Dispense: 20 tablet; Refill: 0 - azithromycin (ZITHROMAX) 250 MG tablet; Take 2 tablets (500 mg) on  Day 1,  followed by 1 tablet (250 mg) once daily on Days 2 through 5.  Dispense: 6 each;  Refill: 1  - discussed meds/SE's.

## 2015-12-21 HISTORY — PX: GUM SURGERY: SHX658

## 2015-12-23 ENCOUNTER — Encounter: Payer: Self-pay | Admitting: Physician Assistant

## 2015-12-23 MED ORDER — LISINOPRIL 20 MG PO TABS: ORAL_TABLET | ORAL | Status: DC

## 2016-02-20 ENCOUNTER — Ambulatory Visit (INDEPENDENT_AMBULATORY_CARE_PROVIDER_SITE_OTHER): Payer: 59 | Admitting: Physician Assistant

## 2016-02-20 ENCOUNTER — Encounter: Payer: Self-pay | Admitting: Physician Assistant

## 2016-02-20 VITALS — BP 100/60 | HR 50 | Temp 97.8°F | Resp 16 | Ht 67.5 in | Wt 180.6 lb

## 2016-02-20 DIAGNOSIS — J45909 Unspecified asthma, uncomplicated: Secondary | ICD-10-CM | POA: Diagnosis not present

## 2016-02-20 DIAGNOSIS — I10 Essential (primary) hypertension: Secondary | ICD-10-CM

## 2016-02-20 DIAGNOSIS — J209 Acute bronchitis, unspecified: Secondary | ICD-10-CM

## 2016-02-20 DIAGNOSIS — E785 Hyperlipidemia, unspecified: Secondary | ICD-10-CM | POA: Diagnosis not present

## 2016-02-20 DIAGNOSIS — Z79899 Other long term (current) drug therapy: Secondary | ICD-10-CM | POA: Diagnosis not present

## 2016-02-20 DIAGNOSIS — E559 Vitamin D deficiency, unspecified: Secondary | ICD-10-CM | POA: Diagnosis not present

## 2016-02-20 DIAGNOSIS — T7840XD Allergy, unspecified, subsequent encounter: Secondary | ICD-10-CM

## 2016-02-20 DIAGNOSIS — R7303 Prediabetes: Secondary | ICD-10-CM | POA: Diagnosis not present

## 2016-02-20 LAB — HEPATIC FUNCTION PANEL
ALBUMIN: 4.2 g/dL (ref 3.6–5.1)
ALT: 25 U/L (ref 9–46)
AST: 22 U/L (ref 10–35)
Alkaline Phosphatase: 51 U/L (ref 40–115)
BILIRUBIN INDIRECT: 0.7 mg/dL (ref 0.2–1.2)
Bilirubin, Direct: 0.2 mg/dL (ref <=0.2)
TOTAL PROTEIN: 6.4 g/dL (ref 6.1–8.1)
Total Bilirubin: 0.9 mg/dL (ref 0.2–1.2)

## 2016-02-20 LAB — LIPID PANEL
CHOLESTEROL: 118 mg/dL — AB (ref 125–200)
HDL: 58 mg/dL (ref >=40)
LDL Cholesterol: 49 mg/dL (ref <130)
TRIGLYCERIDES: 56 mg/dL (ref <150)
Total CHOL/HDL Ratio: 2 Ratio (ref <=5.0)
VLDL: 11 mg/dL (ref <30)

## 2016-02-20 LAB — BASIC METABOLIC PANEL WITH GFR
BUN: 15 mg/dL (ref 7–25)
CALCIUM: 9.8 mg/dL (ref 8.6–10.3)
CO2: 27 mmol/L (ref 20–31)
Chloride: 104 mmol/L (ref 98–110)
Creat: 1.05 mg/dL (ref 0.70–1.25)
GFR, EST AFRICAN AMERICAN: 86 mL/min (ref >=60)
GFR, EST NON AFRICAN AMERICAN: 75 mL/min (ref >=60)
GLUCOSE: 100 mg/dL — AB (ref 65–99)
Potassium: 4.2 mmol/L (ref 3.5–5.3)
Sodium: 139 mmol/L (ref 135–146)

## 2016-02-20 LAB — CBC WITH DIFFERENTIAL/PLATELET
Basophils Absolute: 0.1 10*3/uL (ref 0.0–0.1)
Basophils Relative: 1 % (ref 0–1)
EOS ABS: 0.2 10*3/uL (ref 0.0–0.7)
EOS PCT: 4 % (ref 0–5)
HCT: 42.3 % (ref 39.0–52.0)
Hemoglobin: 15.3 g/dL (ref 13.0–17.0)
LYMPHS ABS: 2.1 10*3/uL (ref 0.7–4.0)
Lymphocytes Relative: 37 % (ref 12–46)
MCH: 32.8 pg (ref 26.0–34.0)
MCHC: 36.2 g/dL — ABNORMAL HIGH (ref 30.0–36.0)
MCV: 90.6 fL (ref 78.0–100.0)
MONO ABS: 0.8 10*3/uL (ref 0.1–1.0)
MONOS PCT: 14 % — AB (ref 3–12)
MPV: 9.5 fL (ref 8.6–12.4)
Neutro Abs: 2.5 10*3/uL (ref 1.7–7.7)
Neutrophils Relative %: 44 % (ref 43–77)
PLATELETS: 294 10*3/uL (ref 150–400)
RBC: 4.67 MIL/uL (ref 4.22–5.81)
RDW: 13.2 % (ref 11.5–15.5)
WBC: 5.7 10*3/uL (ref 4.0–10.5)

## 2016-02-20 LAB — TSH: TSH: 1.51 mIU/L (ref 0.40–4.50)

## 2016-02-20 LAB — MAGNESIUM: Magnesium: 2 mg/dL (ref 1.5–2.5)

## 2016-02-20 MED ORDER — FLUTICASONE PROPIONATE 50 MCG/ACT NA SUSP: NASAL | Status: DC

## 2016-02-20 MED ORDER — MONTELUKAST SODIUM 10 MG PO TABS: 10.0000 mg | ORAL_TABLET | Freq: Every day | ORAL | Status: DC

## 2016-02-20 MED ORDER — AZELASTINE HCL 0.05 % OP SOLN: 1.0000 [drp] | Freq: Two times a day (BID) | OPHTHALMIC | Status: DC | PRN

## 2016-02-20 MED ORDER — PREDNISONE 20 MG PO TABS: ORAL_TABLET | ORAL | Status: DC

## 2016-02-20 MED ORDER — PREDNISOLONE ACETATE 1 % OP SUSP: 1.0000 [drp] | Freq: Four times a day (QID) | OPHTHALMIC | Status: DC | PRN

## 2016-02-20 NOTE — Progress Notes (Signed)
Assessment and Plan:  1. Hypertension -Continue medication, monitor blood pressure at home. Continue DASH diet.  Reminder to go to the ER if any CP, SOB, nausea, dizziness, severe HA, changes vision/speech, left arm numbness and tingling and jaw pain.  2. Cholesterol -Continue diet and exercise. Check cholesterol.   3. Prediabetes  -Continue diet and exercise. Check A1C  4. Vitamin D Def - check level and continue medications.   5. Allergies/asthma Refill medications  Continue diet and meds as discussed. Further disposition pending results of labs. Over 30 minutes of exam, counseling, chart review, and critical decision making was performed Future Appointments Date Time Provider [place]  07/20/2016 10:00 AM Vicie Mutters, PA-C [facility] None    HPI 65 y.o. male  presents for 3 month follow up on hypertension, cholesterol, prediabetes, and vitamin D deficiency.   His blood pressure has been controlled at home, on lisinopril, today their BP is BP: 100/60 mmHg  He does workout. He denies chest pain, shortness of breath, dizziness.  He is on cholesterol medication, on lipitor every other day and cuts in half and denies myalgias. His cholesterol is at goal. The cholesterol last visit was:   Lab Results  Component Value Date   CHOL 124* 10/23/2015   HDL 59 10/23/2015   LDLCALC 48 10/23/2015   TRIG 84 10/23/2015   CHOLHDL 2.1 10/23/2015    He has been working on diet and exercise for prediabetes, and denies paresthesia of the feet, polydipsia, polyuria and visual disturbances. Last A1C in the office was:  Lab Results  Component Value Date   HGBA1C 5.3 10/23/2015   Patient is on Vitamin D supplement.   Lab Results  Component Value Date   VD25OH 85 10/23/2015      Current Medications:  Current Outpatient Prescriptions on File Prior to Visit  Medication Sig Dispense Refill  . ALPRAZolam (XANAX) 0.5 MG tablet Take 0.5 mg by mouth 3 (three) times daily as needed for  anxiety.    Marland Kitchen atorvastatin (LIPITOR) 20 MG tablet Take 1 tablet (20 mg total) by mouth daily. 90 tablet 3  . azelastine (OPTIVAR) 0.05 % ophthalmic solution Place 1 drop into both eyes 2 (two) times daily as needed. 6 mL PRN  . Cholecalciferol (VITAMIN D PO) Take 4,000 Int'l Units by mouth.    . Fexofenadine HCl (ALLEGRA PO) Take by mouth daily.    . fluticasone (FLONASE) 50 MCG/ACT nasal spray USE 2 SPRAYS IN EACH       NOSTRIL DAILY 48 g 3  . lansoprazole (PREVACID) 30 MG capsule Take 1 capsule (30 mg total) by mouth daily at 12 noon. 90 capsule PRN  . lisinopril (PRINIVIL,ZESTRIL) 20 MG tablet Take 1 tablet daily for BP 90 tablet 1  . montelukast (SINGULAIR) 10 MG tablet Take 1 tablet (10 mg total) by mouth at bedtime. 90 tablet 3  . prednisoLONE acetate (PRED FORTE) 1 % ophthalmic suspension Place 1 drop into both eyes 4 (four) times daily as needed. 5 mL PRN   No current facility-administered medications on file prior to visit.   Medical History:  Past Medical History  Diagnosis Date  . Hyperlipidemia   . Hypertension   . Vitamin D deficiency   . Allergy   . Prediabetes   . Asthma    Allergies:  Allergies  Allergen Reactions  . Hismanal [Astemizole]   . Latex   . Penicillins Hives     Review of Systems:  Review of Systems  Constitutional: Negative.  Negative for fever and chills.  HENT: Positive for congestion and hearing loss (wears hearing aids). Negative for ear discharge, ear pain, nosebleeds, sore throat and tinnitus.   Eyes: Negative.   Respiratory: Negative.  Negative for stridor.   Cardiovascular: Negative.   Gastrointestinal: Negative.   Genitourinary: Negative.   Musculoskeletal: Negative.   Skin: Negative.   Neurological: Negative.  Negative for headaches.  Endo/Heme/Allergies: Negative.   Psychiatric/Behavioral: Negative.     Family history- Review and unchanged Social history- Review and unchanged Physical Exam: BP 100/60 mmHg  Pulse 50  Temp(Src)  97.8 F (36.6 C) (Temporal)  Resp 16  Ht 5' 7.5" (1.715 m)  Wt 180 lb 9.6 oz (81.92 kg)  BMI 27.85 kg/m2  SpO2 98% Wt Readings from Last 3 Encounters:  02/20/16 180 lb 9.6 oz (81.92 kg)  10/30/15 179 lb (81.194 kg)  10/23/15 177 lb (80.287 kg)   General Appearance: Well nourished, in no apparent distress. Eyes: PERRLA, EOMs, conjunctiva no swelling or erythema Sinuses: No Frontal/maxillary tenderness ENT/Mouth: Ext aud canals clear, TMs without erythema, bulging. No erythema, swelling, or exudate on post pharynx.  Tonsils not swollen or erythematous. Hearing normal.  Neck: Supple, thyroid normal.  Respiratory: Respiratory effort normal, BS equal bilaterally without rales, rhonchi, wheezing or stridor.  Cardio: RRR with no MRGs. Brisk peripheral pulses without edema.  Abdomen: Soft, + BS,  Non tender, no guarding, rebound, hernias, masses. Lymphatics: Non tender without lymphadenopathy.  Musculoskeletal: Full ROM, 5/5 strength, Normal gait Skin: Warm, dry without rashes, lesions, ecchymosis.  Neuro: Cranial nerves intact. Normal muscle tone, no cerebellar symptoms. Psych: Awake and oriented X 3, normal affect, Insight and Judgment appropriate.    Vicie Mutters, PA-C 8:57 AM [facility]

## 2016-02-20 NOTE — Patient Instructions (Signed)
Please take the prednisone to help decrease inflammation and therefore decrease symptoms. Take it it with food to avoid GI upset. It can cause increased energy but on the other hand it can make it hard to sleep at night so please take it AT NIGHT WITH DINNER, it takes 8-12 hours to start working so it will NOT affect your sleeping if you take it at night with your food!!  If you are diabetic it will increase your sugars so decrease carbs and monitor your sugars closely.    HOW TO TREAT VIRAL COUGH AND COLD SYMPTOMS:  -Symptoms usually last at least 1 week with the worst symptoms being around day 4.  - colds usually start with a sore throat and end with a cough, and the cough can take 2 weeks to get better.  -No antibiotics are needed for colds, flu, sore throats, cough, bronchitis UNLESS symptoms are longer than 7 days OR if you are getting better then get drastically worse.  -There are a lot of combination medications (Dayquil, Nyquil, Vicks 44, tyelnol cold and sinus, ETC). Please look at the ingredients on the back so that you are treating the correct symptoms and not doubling up on medications/ingredients.    Medicines you can use  Nasal congestion  - pseudoephedrine (Sudafed)- behind the counter, do not use if you have high blood pressure, medicine that have -D in them.  - phenylephrine (Sudafed PE) -Dextormethorphan + chlorpheniramine (Coridcidin HBP)- okay if you have high blood pressure -Oxymetazoline (Afrin) nasal spray- LIMIT to 3 days -Saline nasal spray -Neti pot (used distilled or bottled water)  Ear pain/congestion  -pseudoephedrine (sudafed) - Nasonex/flonase nasal spray  Fever  -Acetaminophen (Tyelnol) -Ibuprofen (Advil, motrin, aleve)  Sore Throat  -Acetaminophen (Tyelnol) -Ibuprofen (Advil, motrin, aleve) -Drink a lot of water -Gargle with salt water - Rest your voice (don't talk) -Throat sprays -Cough drops  Body Aches  -Acetaminophen (Tyelnol) -Ibuprofen  (Advil, motrin, aleve)  Headache  -Acetaminophen (Tyelnol) -Ibuprofen (Advil, motrin, aleve) - Exedrin, Exedrin Migraine  Allergy symptoms (cough, sneeze, runny nose, itchy eyes) -Claritin or loratadine cheapest but likely the weakest  -Zyrtec or certizine at night because it can make you sleepy -The strongest is allegra or fexafinadine  Cheapest at [employer], [employer], [employer]  Cough  -Dextromethorphan (Delsym)- medicine that has DM in it -Guafenesin (Mucinex/Robitussin) - cough drops - drink lots of water  Chest Congestion  -Guafenesin (Mucinex/Robitussin)  Red Itchy Eyes  - Naphcon-A  Upset Stomach  - Bland diet (nothing spicy, greasy, fried, and high acid foods like tomatoes, oranges, berries) -OKAY- cereal, bread, soup, crackers, rice -Eat smaller more frequent meals -reduce caffeine, no alcohol -Loperamide (Imodium-AD) if diarrhea -Prevacid for heart burn  General health when sick  -Hydration -wash your hands frequently -keep surfaces clean -change pillow cases and sheets often -Get fresh air but do not exercise strenuously -Vitamin D, double up on it - Vitamin C -Zinc       

## 2016-02-21 LAB — VITAMIN D 25 HYDROXY (VIT D DEFICIENCY, FRACTURES): VIT D 25 HYDROXY: 52 ng/mL (ref 30–100)

## 2016-04-29 IMAGING — CT CT ABD-PELV W/ CM
2 of 5 series · 15 of 46 positions shown, 17 images · IV contrast (Omni 300)
Comparison: None.

CLINICAL DATA: Acute onset of nausea and vomiting. Diaphoresis and
weakness. Initial encounter.

EXAM:
CT ABDOMEN AND PELVIS WITH CONTRAST
TECHNIQUE: Multidetector CT imaging of the abdomen and pelvis was performed
using the standard protocol following bolus administration of
intravenous contrast.
CONTRAST:  100mL OMNIPAQUE IOHEXOL 300 MG/ML  SOLN

[Series 2: abd/ pelvis 5.0 i30f 1 · axial · 0.82mm/px · z∈[-644,-204]mm · 12 of 100 slices shown, 14 images]
[im 6/100  soft-tissue]
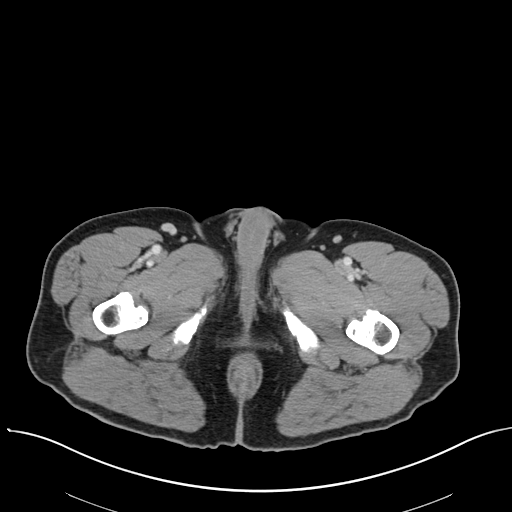
[im 6/100  bone]
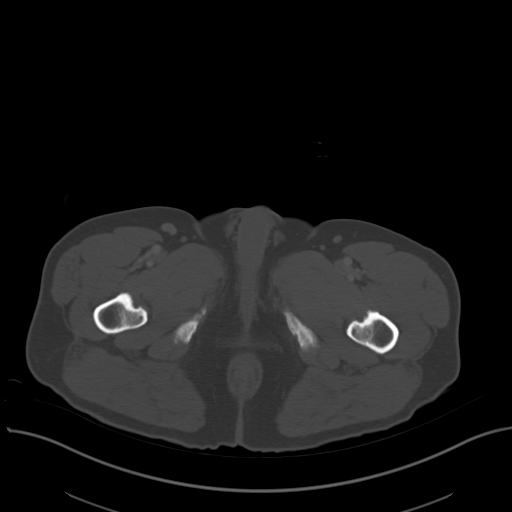
[im 16/100  soft-tissue]
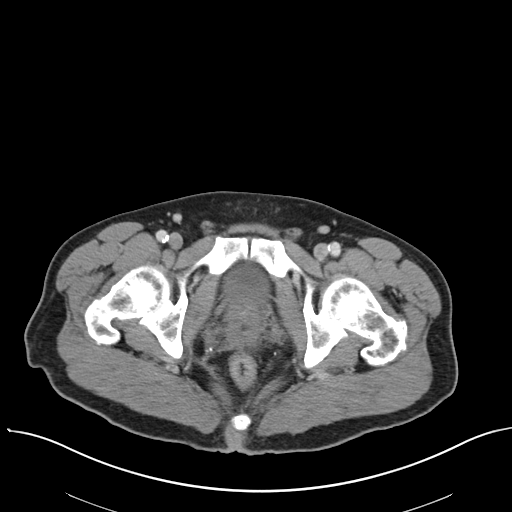
[im 21/100  soft-tissue]
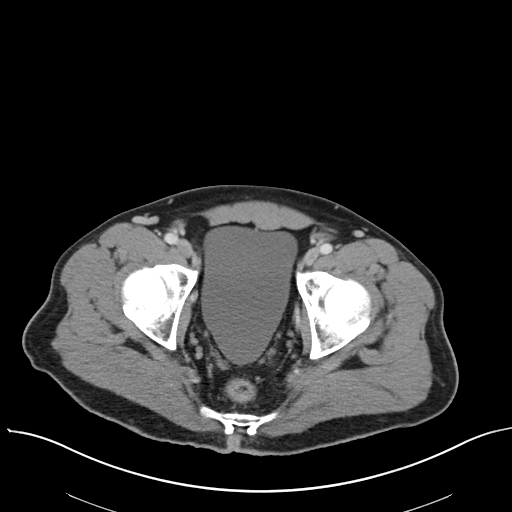
[im 32/100  soft-tissue]
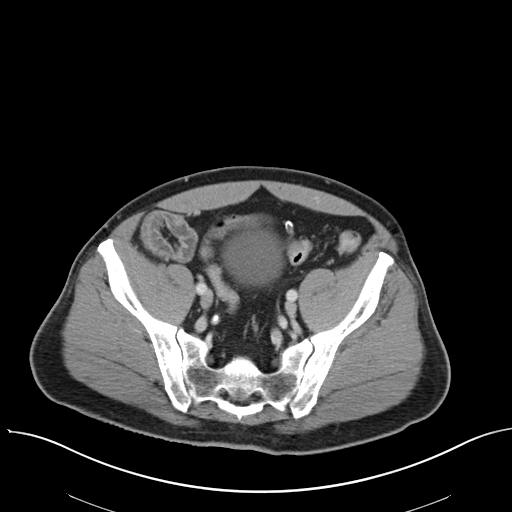
[im 37/100  soft-tissue]
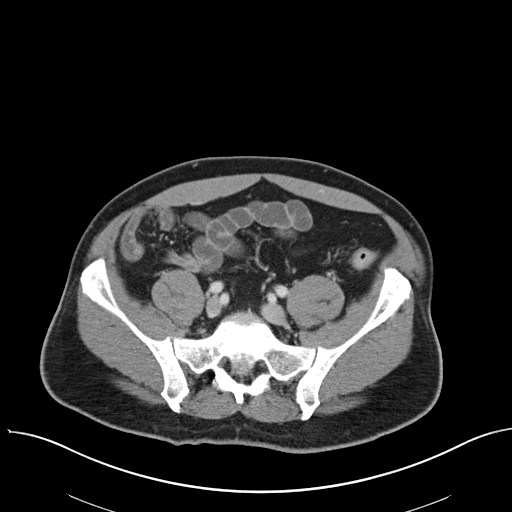
[im 47/100  soft-tissue]
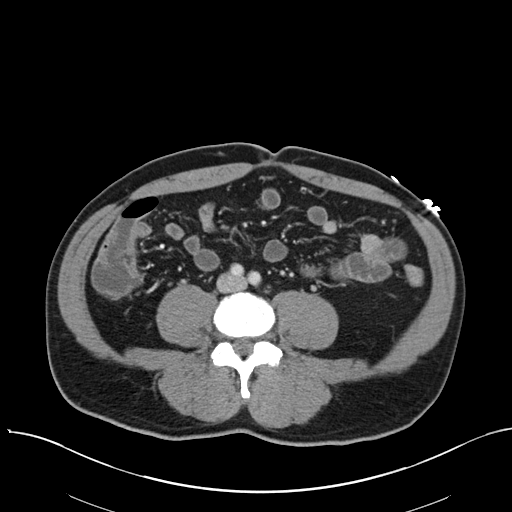
[im 53/100  soft-tissue]
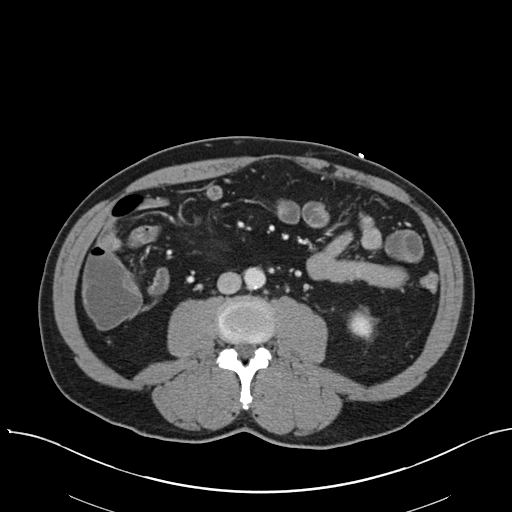
[im 63/100  soft-tissue]
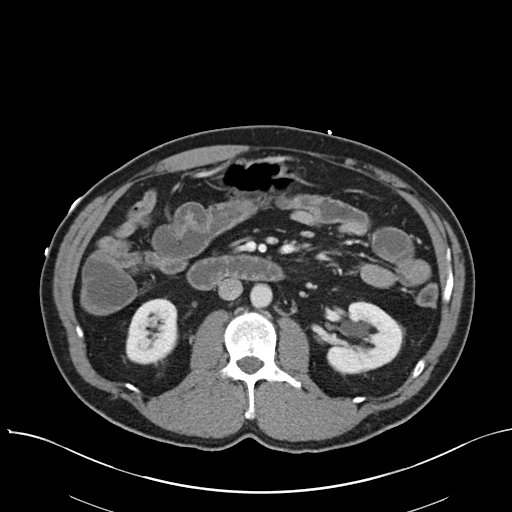
[im 68/100  soft-tissue]
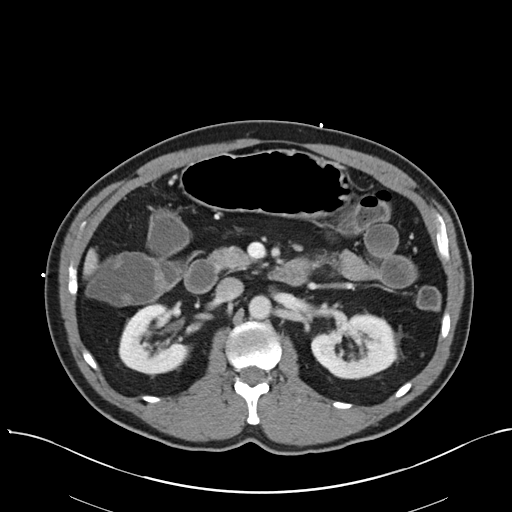
[im 68/100  bone]
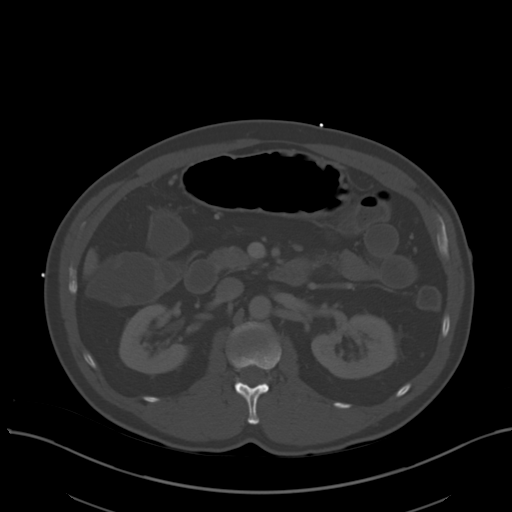
[im 79/100  soft-tissue]
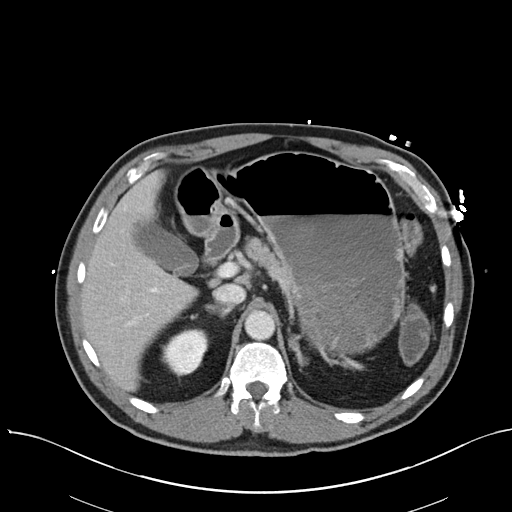
[im 84/100  soft-tissue]
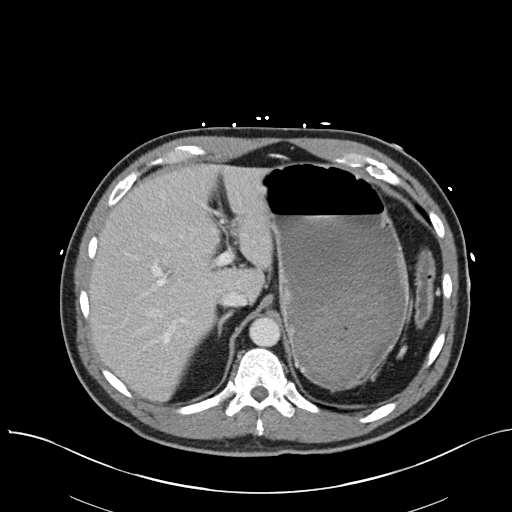
[im 94/100  soft-tissue]
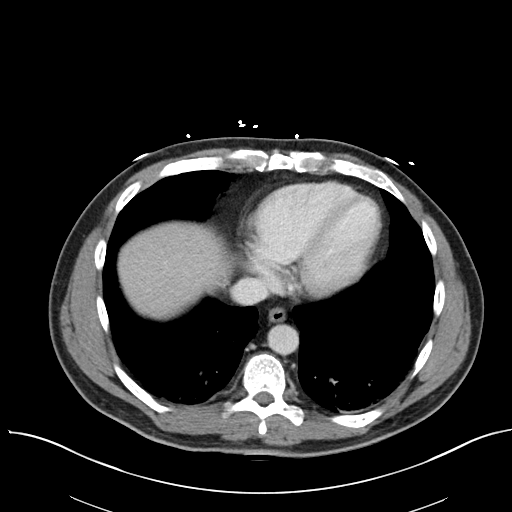

[Series 4: coronals · coronal · 0.71mm/px · 3 of 140 slices shown]
[im 47/140  soft-tissue]
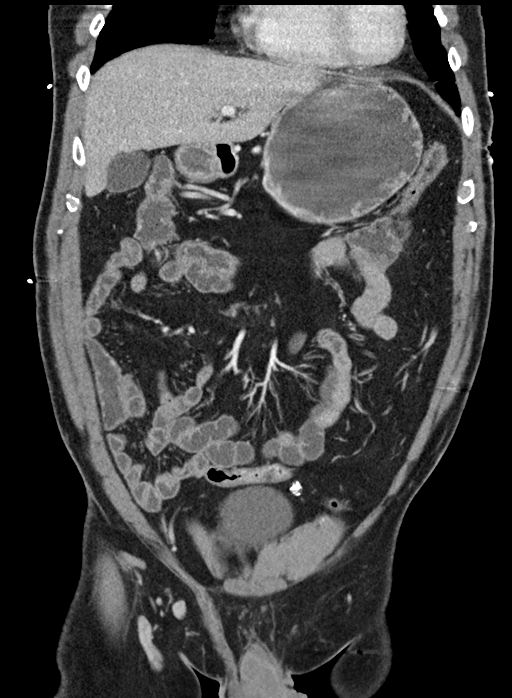
[im 62/140  soft-tissue]
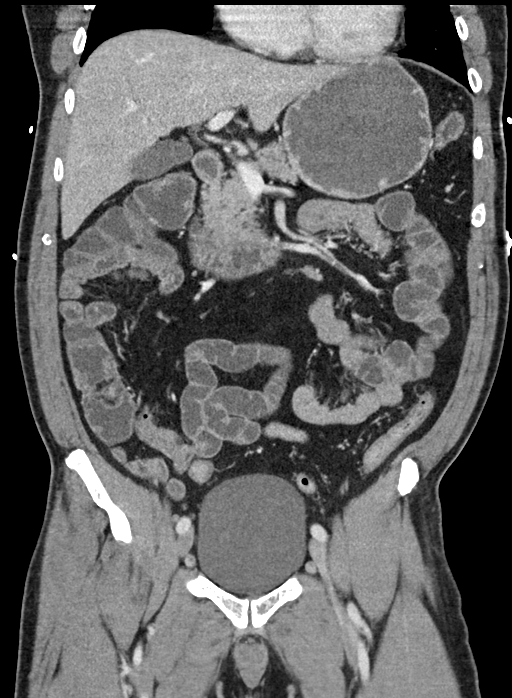
[im 78/140  soft-tissue]
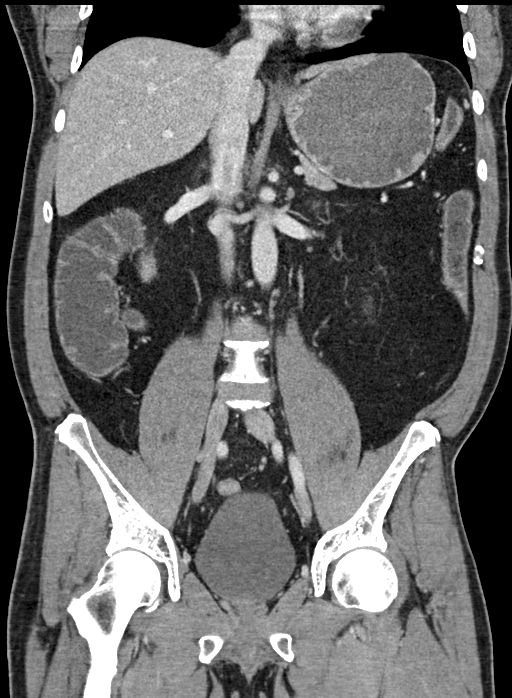

[15 of 46 positions shown; findings below may reference images not displayed]

FINDINGS: Minimal bibasilar atelectasis is noted.

The liver and spleen are unremarkable in appearance. The gallbladder
is within normal limits. The pancreas and right adrenal gland are
unremarkable. A 1.0 cm hypodensity is noted within the left adrenal
gland.

Nonspecific perinephric stranding is noted bilaterally. Scattered
left renal parapelvic cysts are seen. There is no evidence of
hydronephrosis. No obstructing ureteral stones are seen. A 5 mm
stone is noted near the upper pole of the right kidney, and multiple
left renal stones are seen, measuring up to 8 mm in size.

No free fluid is identified. The small bowel is unremarkable in
appearance. The stomach is within normal limits. No acute vascular
abnormalities are seen. Minimal calcification is noted along the
distal abdominal aorta and its branches.

The appendix is not definitely seen; there is no evidence for
appendicitis. The colon is partially filled with fluid and is
unremarkable in appearance. A likely calcified epiploic appendage is
noted adjacent to the distal sigmoid colon.

The bladder is mildly distended and grossly unremarkable. The
prostate remains normal in size. No inguinal lymphadenopathy is
seen.

No acute osseous abnormalities are identified.
IMPRESSION: 1. No acute abnormality seen to explain the patient's symptoms.
2. Bilateral nonobstructing renal stones noted, more prominent on
the left. Scattered left renal parapelvic cysts seen. No evidence of
hydronephrosis.
3. Small nonspecific 1.0 cm hypodensity within the left adrenal
gland. This could be further assessed on adrenal protocol MRI or CT,
as deemed clinically appropriate.

## 2016-05-04 ENCOUNTER — Encounter: Payer: Self-pay | Admitting: Physician Assistant

## 2016-05-06 ENCOUNTER — Encounter: Payer: Self-pay | Admitting: Physician Assistant

## 2016-05-06 ENCOUNTER — Ambulatory Visit (INDEPENDENT_AMBULATORY_CARE_PROVIDER_SITE_OTHER): Payer: 59 | Admitting: Physician Assistant

## 2016-05-06 VITALS — BP 110/70 | HR 73 | Temp 97.5°F | Resp 16 | Ht 67.5 in | Wt 181.2 lb

## 2016-05-06 DIAGNOSIS — R109 Unspecified abdominal pain: Secondary | ICD-10-CM | POA: Diagnosis not present

## 2016-05-06 DIAGNOSIS — K429 Umbilical hernia without obstruction or gangrene: Secondary | ICD-10-CM

## 2016-05-06 NOTE — Progress Notes (Signed)
Subjective:    Patient ID: Barry Flynn, male    DOB: 1951-08-23, 65 y.o.   MRN: [id number]  HPI 65 y.o. WM presents with possible umbilical hernia.  He was doing sit ups several years ago, felt an umbilical burning sensation, has not bothered him until recently he was looking in the mirror and had a protrusion from his belly button, able to push it back in, no pain.   He also has a history of kidney stones in the past, has been having intermittent x several months, has gotten worse. Feels like a pressure, worse at night. Has seen Dr. Edwena Blow in the past, but would prefer to see someone else. Denies any urinary symptoms, N/V, fever, chills.    Blood pressure 110/70, pulse 73, temperature 97.5 F (36.4 C), temperature source Temporal, resp. rate 16, height 5' 7.5" (1.715 m), weight 181 lb 3.2 oz (82.192 kg), SpO2 97 %.  Past Medical History  Diagnosis Date  . Hyperlipidemia   . Hypertension   . Vitamin D deficiency   . Allergy   . Prediabetes   . Asthma    Current Outpatient Prescriptions on File Prior to Visit  Medication Sig Dispense Refill  . ALPRAZolam (XANAX) 0.5 MG tablet Take 0.5 mg by mouth 3 (three) times daily as needed for anxiety.    Marland Kitchen atorvastatin (LIPITOR) 20 MG tablet Take 1 tablet (20 mg total) by mouth daily. 90 tablet 3  . azelastine (OPTIVAR) 0.05 % ophthalmic solution Place 1 drop into both eyes 2 (two) times daily as needed. 6 mL PRN  . Cholecalciferol (VITAMIN D PO) Take 4,000 Int'l Units by mouth.    . Fexofenadine HCl (ALLEGRA PO) Take by mouth daily.    . fluticasone (FLONASE) 50 MCG/ACT nasal spray USE 2 SPRAYS IN EACH       NOSTRIL DAILY 48 g 3  . lansoprazole (PREVACID) 30 MG capsule Take 1 capsule (30 mg total) by mouth daily at 12 noon. 90 capsule PRN  . lisinopril (PRINIVIL,ZESTRIL) 20 MG tablet Take 1 tablet daily for BP 90 tablet 1  . montelukast (SINGULAIR) 10 MG tablet Take 1 tablet (10 mg total) by mouth at bedtime. 90 tablet 3  . prednisoLONE  acetate (PRED FORTE) 1 % ophthalmic suspension Place 1 drop into both eyes 4 (four) times daily as needed. 5 mL PRN   No current facility-administered medications on file prior to visit.    Review of Systems  Constitutional: Negative.  Negative for fever, chills, activity change, appetite change, fatigue and unexpected weight change.  HENT: Negative.   Respiratory: Negative.   Cardiovascular: Negative.   Gastrointestinal: Positive for abdominal distention. Negative for nausea, vomiting, abdominal pain, diarrhea, constipation, blood in stool, anal bleeding and rectal pain.  Genitourinary: Positive for flank pain. Negative for dysuria, urgency, frequency, hematuria, decreased urine volume, discharge, penile swelling, scrotal swelling, enuresis, difficulty urinating, genital sores, penile pain and testicular pain.  Musculoskeletal: Negative.   Skin: Negative.  Negative for rash.       Objective:   Physical Exam  Constitutional: He is oriented to person, place, and time. He appears well-developed and well-nourished.  HENT:  Head: Normocephalic and atraumatic.  Right Ear: External ear normal.  Left Ear: External ear normal.  Mouth/Throat: Oropharynx is clear and moist.  Eyes: Conjunctivae and EOM are normal. Pupils are equal, round, and reactive to light.  Neck: Normal range of motion. Neck supple.  Cardiovascular: Normal rate, regular rhythm and normal heart sounds.  Pulmonary/Chest: Effort normal and breath sounds normal.  Abdominal: Soft. Bowel sounds are normal. There is no tenderness. A hernia (small retractable umblical hernia) is present.  Musculoskeletal: Normal range of motion.  Neurological: He is alert and oriented to person, place, and time. No cranial nerve deficit.  Skin: Skin is warm and dry.  Psychiatric: He has a normal mood and affect. His behavior is normal.       Assessment & Plan:   1. Umbilical hernia without obstruction and without gangrene Patient  reassured, given information about hernias, declines referral and will monitor it.   2. Right flank pain No fever, chills, urinary symptoms, very intermittent, declines work up at this time, will see in August  Future Appointments Date Time Provider [place]  07/20/2016 10:00 AM Vicie Mutters, PA-C [facility] None

## 2016-05-06 NOTE — Patient Instructions (Addendum)
Hernia, Adult A hernia is the bulging of an organ or tissue through a weak spot in the muscles of the abdomen (abdominal wall). Hernias develop most often near the navel or groin. There are many kinds of hernias. Common kinds include:  Femoral hernia. This kind of hernia develops under the groin in the upper thigh area.  Inguinal hernia. This kind of hernia develops in the groin or scrotum.  Umbilical hernia. This kind of hernia develops near the navel.  Hiatal hernia. This kind of hernia causes part of the stomach to be pushed up into the chest.  Incisional hernia. This kind of hernia bulges through a scar from an abdominal surgery. CAUSES This condition may be caused by:  Heavy lifting.  Coughing over a long period of time.  Straining to have a bowel movement.  An incision made during an abdominal surgery.  A birth defect (congenital defect).  Excess weight or obesity.  Smoking.  Poor nutrition.  Cystic fibrosis.  Excess fluid in the abdomen.  Undescended testicles. SYMPTOMS Symptoms of a hernia include:  A lump on the abdomen. This is the first sign of a hernia. The lump may become more obvious with standing, straining, or coughing. It may get bigger over time if it is not treated or if the condition causing it is not treated.  Pain. A hernia is usually painless, but it may become painful over time if treatment is delayed. The pain is usually dull and may get worse with standing or lifting heavy objects. Sometimes a hernia gets tightly squeezed in the weak spot (strangulated) or stuck there (incarcerated) and causes additional symptoms. These symptoms may include:  Vomiting.  Nausea.  Constipation.  Irritability. DIAGNOSIS A hernia may be diagnosed with:  A physical exam. During the exam your health care provider may ask you to cough or to make a specific movement, because a hernia is usually more visible when you move.  Imaging tests. These can  include:  X-rays.  Ultrasound.  CT scan. TREATMENT A hernia that is small and painless may not need to be treated. A hernia that is large or painful may be treated with surgery. Inguinal hernias may be treated with surgery to prevent incarceration or strangulation. Strangulated hernias are always treated with surgery, because lack of blood to the trapped organ or tissue can cause it to die. Surgery to treat a hernia involves pushing the bulge back into place and repairing the weak part of the abdomen. HOME CARE INSTRUCTIONS  Avoid straining.  Do not lift anything heavier than 10 lb (4.5 kg).  Lift with your leg muscles, not your back muscles. This helps avoid strain.  When coughing, try to cough gently.  Prevent constipation. Constipation leads to straining with bowel movements, which can make a hernia worse or cause a hernia repair to break down. You can prevent constipation by:  Eating a high-fiber diet that includes plenty of fruits and vegetables.  Drinking enough fluids to keep your urine clear or pale yellow. Aim to drink 6-8 glasses of water per day.  Using a stool softener as directed by your health care provider.  Lose weight, if you are overweight.  Do not use any tobacco products, including cigarettes, chewing tobacco, or electronic cigarettes. If you need help quitting, ask your health care provider.  Keep all follow-up visits as directed by your health care provider. This is important. Your health care provider may need to monitor your condition. SEEK MEDICAL CARE IF:  You have   swelling, redness, and pain in the affected area.  Your bowel habits change. SEEK IMMEDIATE MEDICAL CARE IF:  You have a fever.  You have abdominal pain that is getting worse.  You feel nauseous or you vomit.  You cannot push the hernia back in place by gently pressing on it while you are lying down.  The hernia:  Changes in shape or size.  Is stuck outside the  abdomen.  Becomes discolored.  Feels hard or tender.   This information is not intended to replace advice given to you by your health care provider. Make sure you discuss any questions you have with your health care provider.   Document Released: 12/06/2005 Document Revised: 12/27/2014 Document Reviewed: 10/16/2014 Elsevier Interactive Patient Education 2016 [employer]  Kidney Stones Kidney stones (urolithiasis) are deposits that form inside your kidneys. The intense pain is caused by the stone moving through the urinary tract. When the stone moves, the ureter goes into spasm around the stone. The stone is usually passed in the urine.  CAUSES   A disorder that makes certain neck glands produce too much parathyroid hormone (primary hyperparathyroidism).  A buildup of uric acid crystals, similar to gout in your joints.  Narrowing (stricture) of the ureter.  A kidney obstruction present at birth (congenital obstruction).  Previous surgery on the kidney or ureters.  Numerous kidney infections. SYMPTOMS   Feeling sick to your stomach (nauseous).  Throwing up (vomiting).  Blood in the urine (hematuria).  Pain that usually spreads (radiates) to the groin.  Frequency or urgency of urination. DIAGNOSIS   Taking a history and physical exam.  Blood or urine tests.  CT scan.  Occasionally, an examination of the inside of the urinary bladder (cystoscopy) is performed. TREATMENT   Observation.  Increasing your fluid intake.  Extracorporeal shock wave lithotripsy--This is a noninvasive procedure that uses shock waves to break up kidney stones.  Surgery may be needed if you have severe pain or persistent obstruction. There are various surgical procedures. Most of the procedures are performed with the use of small instruments. Only small incisions are needed to accommodate these instruments, so recovery time is minimized. The size, location, and chemical composition are all  important variables that will determine the proper choice of action for you. Talk to your health care provider to better understand your situation so that you will minimize the risk of injury to yourself and your kidney.  HOME CARE INSTRUCTIONS   Drink enough water and fluids to keep your urine clear or pale yellow. This will help you to pass the stone or stone fragments.  Strain all urine through the provided strainer. Keep all particulate matter and stones for your health care provider to see. The stone causing the pain may be as small as a grain of salt. It is very important to use the strainer each and every time you pass your urine. The collection of your stone will allow your health care provider to analyze it and verify that a stone has actually passed. The stone analysis will often identify what you can do to reduce the incidence of recurrences.  Only take over-the-counter or prescription medicines for pain, discomfort, or fever as directed by your health care provider.  Keep all follow-up visits as told by your health care provider. This is important.  Get follow-up X-rays if required. The absence of pain does not always mean that the stone has passed. It may have only stopped moving. If the urine remains completely  obstructed, it can cause loss of kidney function or even complete destruction of the kidney. It is your responsibility to make sure X-rays and follow-ups are completed. Ultrasounds of the kidney can show blockages and the status of the kidney. Ultrasounds are not associated with any radiation and can be performed easily in a matter of minutes.  Make changes to your daily diet as told by your health care provider. You may be told to:  Limit the amount of salt that you eat.  Eat 5 or more servings of fruits and vegetables each day.  Limit the amount of meat, poultry, fish, and eggs that you eat.  Collect a 24-hour urine sample as told by your health care provider.You may need  to collect another urine sample every 6-12 months. SEEK MEDICAL CARE IF:  You experience pain that is progressive and unresponsive to any pain medicine you have been prescribed. SEEK IMMEDIATE MEDICAL CARE IF:   Pain cannot be controlled with the prescribed medicine.  You have a fever or shaking chills.  The severity or intensity of pain increases over 18 hours and is not relieved by pain medicine.  You develop a new onset of abdominal pain.  You feel faint or pass out.  You are unable to urinate.   This information is not intended to replace advice given to you by your health care provider. Make sure you discuss any questions you have with your health care provider.   Document Released: 12/06/2005 Document Revised: 08/27/2015 Document Reviewed: 05/09/2013 Elsevier Interactive Patient Education [employer].

## 2016-07-20 ENCOUNTER — Ambulatory Visit (INDEPENDENT_AMBULATORY_CARE_PROVIDER_SITE_OTHER): Payer: 59 | Admitting: Physician Assistant

## 2016-07-20 ENCOUNTER — Encounter: Payer: Self-pay | Admitting: Physician Assistant

## 2016-07-20 VITALS — BP 124/80 | HR 55 | Temp 97.3°F | Resp 16 | Ht 67.5 in | Wt 182.0 lb

## 2016-07-20 DIAGNOSIS — D649 Anemia, unspecified: Secondary | ICD-10-CM

## 2016-07-20 DIAGNOSIS — Z125 Encounter for screening for malignant neoplasm of prostate: Secondary | ICD-10-CM

## 2016-07-20 DIAGNOSIS — Z23 Encounter for immunization: Secondary | ICD-10-CM | POA: Diagnosis not present

## 2016-07-20 DIAGNOSIS — Z0000 Encounter for general adult medical examination without abnormal findings: Secondary | ICD-10-CM

## 2016-07-20 DIAGNOSIS — T7840XD Allergy, unspecified, subsequent encounter: Secondary | ICD-10-CM

## 2016-07-20 DIAGNOSIS — Z1159 Encounter for screening for other viral diseases: Secondary | ICD-10-CM

## 2016-07-20 DIAGNOSIS — Z85820 Personal history of malignant melanoma of skin: Secondary | ICD-10-CM

## 2016-07-20 DIAGNOSIS — Z0001 Encounter for general adult medical examination with abnormal findings: Secondary | ICD-10-CM

## 2016-07-20 DIAGNOSIS — Z79899 Other long term (current) drug therapy: Secondary | ICD-10-CM

## 2016-07-20 DIAGNOSIS — R001 Bradycardia, unspecified: Secondary | ICD-10-CM

## 2016-07-20 DIAGNOSIS — R7303 Prediabetes: Secondary | ICD-10-CM

## 2016-07-20 DIAGNOSIS — H9193 Unspecified hearing loss, bilateral: Secondary | ICD-10-CM

## 2016-07-20 DIAGNOSIS — E785 Hyperlipidemia, unspecified: Secondary | ICD-10-CM

## 2016-07-20 DIAGNOSIS — E559 Vitamin D deficiency, unspecified: Secondary | ICD-10-CM

## 2016-07-20 DIAGNOSIS — F418 Other specified anxiety disorders: Secondary | ICD-10-CM

## 2016-07-20 DIAGNOSIS — J45909 Unspecified asthma, uncomplicated: Secondary | ICD-10-CM

## 2016-07-20 DIAGNOSIS — I10 Essential (primary) hypertension: Secondary | ICD-10-CM

## 2016-07-20 LAB — CBC WITH DIFFERENTIAL/PLATELET
BASOS PCT: 0 %
Basophils Absolute: 0 cells/uL (ref 0–200)
Eosinophils Absolute: 204 cells/uL (ref 15–500)
Eosinophils Relative: 3 %
HCT: 43.4 % (ref 38.5–50.0)
Hemoglobin: 15 g/dL (ref 13.2–17.1)
LYMPHS PCT: 32 %
Lymphs Abs: 2176 cells/uL (ref 850–3900)
MCH: 31.8 pg (ref 27.0–33.0)
MCHC: 34.6 g/dL (ref 32.0–36.0)
MCV: 92.1 fL (ref 80.0–100.0)
MONOS PCT: 11 %
MPV: 10.1 fL (ref 7.5–12.5)
Monocytes Absolute: 748 cells/uL (ref 200–950)
NEUTROS PCT: 54 %
Neutro Abs: 3672 cells/uL (ref 1500–7800)
PLATELETS: 258 10*3/uL (ref 140–400)
RBC: 4.71 MIL/uL (ref 4.20–5.80)
RDW: 13.6 % (ref 11.0–15.0)
WBC: 6.8 10*3/uL (ref 3.8–10.8)
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
## 2016-07-21 LAB — BASIC METABOLIC PANEL WITH GFR
BUN: 19 mg/dL (ref 7–25)
CALCIUM: 9.7 mg/dL (ref 8.6–10.3)
CO2: 23 mmol/L (ref 20–31)
Chloride: 104 mmol/L (ref 98–110)
Creat: 1.12 mg/dL (ref 0.70–1.25)
GFR, EST AFRICAN AMERICAN: 80 mL/min (ref >=60)
GFR, EST NON AFRICAN AMERICAN: 69 mL/min (ref >=60)
GLUCOSE: 89 mg/dL (ref 65–99)
POTASSIUM: 4.4 mmol/L (ref 3.5–5.3)
Sodium: 139 mmol/L (ref 135–146)

## 2016-07-21 LAB — URINALYSIS, ROUTINE W REFLEX MICROSCOPIC
BILIRUBIN URINE: NEGATIVE
GLUCOSE, UA: NEGATIVE
Hgb urine dipstick: NEGATIVE
KETONES UR: NEGATIVE
Nitrite: NEGATIVE
Protein, ur: NEGATIVE
Specific Gravity, Urine: 1.025 (ref 1.001–1.035)
pH: 5.5 (ref 5.0–8.0)

## 2016-07-21 LAB — URINALYSIS, MICROSCOPIC ONLY
BACTERIA UA: NONE SEEN [HPF]
CRYSTALS: NONE SEEN [HPF]
Casts: NONE SEEN [LPF]
Squamous Epithelial / LPF: NONE SEEN [HPF] (ref <=5)
YEAST: NONE SEEN [HPF]

## 2016-07-21 LAB — LIPID PANEL
CHOLESTEROL: 135 mg/dL (ref 125–200)
HDL: 59 mg/dL (ref >=40)
LDL CALC: 60 mg/dL (ref <130)
TRIGLYCERIDES: 79 mg/dL (ref <150)
Total CHOL/HDL Ratio: 2.3 Ratio (ref <=5.0)
VLDL: 16 mg/dL (ref <30)

## 2016-07-21 LAB — HEPATIC FUNCTION PANEL
ALBUMIN: 4.2 g/dL (ref 3.6–5.1)
ALK PHOS: 52 U/L (ref 40–115)
ALT: 25 U/L (ref 9–46)
AST: 23 U/L (ref 10–35)
BILIRUBIN INDIRECT: 0.5 mg/dL (ref 0.2–1.2)
BILIRUBIN TOTAL: 0.7 mg/dL (ref 0.2–1.2)
Bilirubin, Direct: 0.2 mg/dL (ref <=0.2)
TOTAL PROTEIN: 6.5 g/dL (ref 6.1–8.1)

## 2016-07-21 LAB — HIV ANTIBODY (ROUTINE TESTING W REFLEX): HIV 1&2 Ab, 4th Generation: NONREACTIVE

## 2016-07-21 LAB — PSA: PSA: 0.64 ng/mL (ref <=4.00)

## 2016-07-21 LAB — FERRITIN: FERRITIN: 179 ng/mL (ref 20–380)

## 2016-07-21 LAB — IRON AND TIBC
%SAT: 52 % (ref 15–60)
IRON: 158 ug/dL (ref 50–180)
TIBC: 303 ug/dL (ref 250–425)
UIBC: 145 ug/dL (ref 125–400)

## 2016-07-21 LAB — MICROALBUMIN / CREATININE URINE RATIO
Creatinine, Urine: 219 mg/dL (ref 20–370)
Microalb Creat Ratio: 7 mcg/mg creat (ref <30)
Microalb, Ur: 1.5 mg/dL

## 2016-07-21 LAB — MAGNESIUM: Magnesium: 2 mg/dL (ref 1.5–2.5)

## 2016-07-21 LAB — TSH: TSH: 2.33 mIU/L (ref 0.40–4.50)

## 2016-07-21 LAB — VITAMIN D 25 HYDROXY (VIT D DEFICIENCY, FRACTURES): Vit D, 25-Hydroxy: 58 ng/mL (ref 30–100)

## 2016-09-14 ENCOUNTER — Encounter: Payer: Self-pay | Admitting: Physician Assistant

## 2016-09-14 ENCOUNTER — Ambulatory Visit (INDEPENDENT_AMBULATORY_CARE_PROVIDER_SITE_OTHER): Payer: 59 | Admitting: Physician Assistant

## 2016-09-14 VITALS — BP 132/64 | HR 75 | Temp 97.3°F | Resp 16 | Ht 67.5 in | Wt 184.0 lb

## 2016-09-14 DIAGNOSIS — N3000 Acute cystitis without hematuria: Secondary | ICD-10-CM | POA: Diagnosis not present

## 2016-09-14 MED ORDER — SULFAMETHOXAZOLE-TRIMETHOPRIM 800-160 MG PO TABS: 1.0000 | ORAL_TABLET | Freq: Two times a day (BID) | ORAL | 0 refills | Status: DC

## 2016-09-14 MED ORDER — PHENAZOPYRIDINE HCL 200 MG PO TABS: 200.0000 mg | ORAL_TABLET | Freq: Three times a day (TID) | ORAL | 0 refills | Status: DC | PRN

## 2016-09-14 NOTE — Patient Instructions (Signed)
Urinary Tract Infection Urinary tract infections (UTIs) can develop anywhere along your urinary tract. Your urinary tract is your body's drainage system for removing wastes and extra water. Your urinary tract includes two kidneys, two ureters, a bladder, and a urethra. Your kidneys are a pair of bean-shaped organs. Each kidney is about the size of your fist. They are located below your ribs, one on each side of your spine. CAUSES Infections are caused by microbes, which are microscopic organisms, including fungi, viruses, and bacteria. These organisms are so small that they can only be seen through a microscope. Bacteria are the microbes that most commonly cause UTIs. SYMPTOMS  Symptoms of UTIs may vary by age and gender of the patient and by the location of the infection. Symptoms in young women typically include a frequent and intense urge to urinate and a painful, burning feeling in the bladder or urethra during urination. Older women and men are more likely to be tired, shaky, and weak and have muscle aches and abdominal pain. A fever may mean the infection is in your kidneys. Other symptoms of a kidney infection include pain in your back or sides below the ribs, nausea, and vomiting. DIAGNOSIS To diagnose a UTI, your caregiver will ask you about your symptoms. Your caregiver will also ask you to provide a urine sample. The urine sample will be tested for bacteria and white blood cells. White blood cells are made by your body to help fight infection. TREATMENT  Typically, UTIs can be treated with medication. Because most UTIs are caused by a bacterial infection, they usually can be treated with the use of antibiotics. The choice of antibiotic and length of treatment depend on your symptoms and the type of bacteria causing your infection. HOME CARE INSTRUCTIONS  If you were prescribed antibiotics, take them exactly as your caregiver instructs you. Finish the medication even if you feel better after  you have only taken some of the medication.  Drink enough water and fluids to keep your urine clear or pale yellow.  Avoid caffeine, tea, and carbonated beverages. They tend to irritate your bladder.  Empty your bladder often. Avoid holding urine for long periods of time.  Empty your bladder before and after sexual intercourse.  After a bowel movement, women should cleanse from front to back. Use each tissue only once. SEEK MEDICAL CARE IF:   You have back pain.  You develop a fever.  Your symptoms do not begin to resolve within 3 days. SEEK IMMEDIATE MEDICAL CARE IF:   You have severe back pain or lower abdominal pain.  You develop chills.  You have nausea or vomiting.  You have continued burning or discomfort with urination. MAKE SURE YOU:   Understand these instructions.  Will watch your condition.  Will get help right away if you are not doing well or get worse.   This information is not intended to replace advice given to you by your health care provider. Make sure you discuss any questions you have with your health care provider.   Document Released: 09/15/2005 Document Revised: 08/27/2015 Document Reviewed: 01/14/2012 Elsevier Interactive Patient Education 2016 Else[employer]   Prostatitis The prostate gland is about the size and shape of a walnut. It is located just below your bladder. It produces one of the components of semen, which is made up of sperm and the fluids that help nourish and transport it out from the testicles. Prostatitis is inflammation of the prostate gland.  There are four types  of prostatitis:  Acute bacterial prostatitis. This is the least common type of prostatitis. It starts quickly and usually is associated with a bladder infection, high fever, and shaking chills. It can occur at any age.  Chronic bacterial prostatitis. This is a persistent bacterial infection in the prostate. It usually develops from repeated acute bacterial  prostatitis or acute bacterial prostatitis that was not properly treated. It can occur in men of any age but is most common in middle-aged men whose prostate has begun to enlarge. The symptoms are not as severe as those in acute bacterial prostatitis. Discomfort in the part of your body that is in front of your rectum and below your scrotum (perineum), lower abdomen, or in the head of your penis (glans) may represent your primary discomfort.  Chronic prostatitis (nonbacterial). This is the most common type of prostatitis. It is inflammation of the prostate gland that is not caused by a bacterial infection. The cause is unknown and may be associated with a viral infection or autoimmune disorder.  Prostatodynia (pelvic floor disorder). This is associated with increased muscular tone in the pelvis surrounding the prostate. CAUSES The causes of bacterial prostatitis are bacterial infection. The causes of the other types of prostatitis are unknown.  SYMPTOMS  Symptoms can vary depending upon the type of prostatitis that exists. There can also be overlap in symptoms. Possible symptoms for each type of prostatitis are listed below. Acute Bacterial Prostatitis  Painful urination.  Fever or chills.  Muscle or joint pains.  Low back pain.  Low abdominal pain.  Inability to empty bladder completely. Chronic Bacterial Prostatitis, Chronic Nonbacterial Prostatitis, and Prostatodynia  Sudden urge to urinate.  Frequent urination.  Difficulty starting urine stream.  Weak urine stream.  Discharge from the urethra.  Dribbling after urination.  Rectal pain.  Pain in the testicles, penis, or tip of the penis.  Pain in the perineum.  Problems with sexual function.  Painful ejaculation.  Bloody semen. DIAGNOSIS  In order to diagnose prostatitis, your health care provider will ask about your symptoms. One or more urine samples will be taken and tested (urinalysis). If the urinalysis result  is negative for bacteria, your health care provider may use a finger to feel your prostate (digital rectal exam). This exam helps your health care provider determine if your prostate is swollen and tender. It will also produce a specimen of semen that can be analyzed. TREATMENT  Treatment for prostatitis depends on the cause. If a bacterial infection is the cause, it can be treated with antibiotic medicine. In cases of chronic bacterial prostatitis, the use of antibiotics for up to 1 month or 6 weeks may be necessary. Your health care provider may instruct you to take sitz baths to help relieve pain. A sitz bath is a bath of hot water in which your hips and buttocks are under water. This relaxes the pelvic floor muscles and often helps to relieve the pressure on your prostate. HOME CARE INSTRUCTIONS   Take all medicines as directed by your health care provider.  Take sitz baths as directed by your health care provider. SEEK MEDICAL CARE IF:   Your symptoms get worse, not better.  You have a fever. SEEK IMMEDIATE MEDICAL CARE IF:   You have chills.  You feel nauseous or vomit.  You feel lightheaded or faint.  You are unable to urinate.  You have blood or blood clots in your urine. MAKE SURE YOU:  Understand these instructions.  Will watch your  condition.  Will get help right away if you are not doing well or get worse.   This information is not intended to replace advice given to you by your health care provider. Make sure you discuss any questions you have with your health care provider.   Document Released: 12/03/2000 Document Revised: 12/27/2014 Document Reviewed: 06/25/2013 Elsevier Interactive Patient Education [employer].

## 2016-09-14 NOTE — Progress Notes (Signed)
Subjective:    Patient ID: Barry Flynn, male    DOB: Nov 21, 1951, 65 y.o.   MRN: [id number]  HPI 65 y.o. WM presents with urinary frequency. States he has urinary frequency q hour, burning x 1 week. He has suprapubic pressure/discomfort, denies back pain, flank pain. Denies hesitancy, dribbling, weak stream, discharge, fever chills, hematuria. .   Blood pressure 132/64, pulse 75, temperature 97.3 F (36.3 C), resp. rate 16, height 5' 7.5" (1.715 m), weight 184 lb (83.5 kg), SpO2 96 %.  Medications Current Outpatient Prescriptions on File Prior to Visit  Medication Sig  . ALPRAZolam (XANAX) 0.5 MG tablet Take 0.5 mg by mouth 3 (three) times daily as needed for anxiety.  Marland Kitchen atorvastatin (LIPITOR) 20 MG tablet Take 1 tablet (20 mg total) by mouth daily.  Marland Kitchen azelastine (OPTIVAR) 0.05 % ophthalmic solution Place 1 drop into both eyes 2 (two) times daily as needed.  . Cholecalciferol (VITAMIN D PO) Take 4,000 Int'l Units by mouth.  . clindamycin (CLEOCIN) 150 MG capsule Take by mouth 3 (three) times daily.  Marland Kitchen Fexofenadine HCl (ALLEGRA PO) Take by mouth daily.  . fluticasone (FLONASE) 50 MCG/ACT nasal spray USE 2 SPRAYS IN EACH       NOSTRIL DAILY  . ibuprofen (ADVIL,MOTRIN) 800 MG tablet Take 800 mg by mouth every 8 (eight) hours as needed.  . lansoprazole (PREVACID) 30 MG capsule Take 1 capsule (30 mg total) by mouth daily at 12 noon.  . montelukast (SINGULAIR) 10 MG tablet Take 1 tablet (10 mg total) by mouth at bedtime.  . prednisoLONE acetate (PRED FORTE) 1 % ophthalmic suspension Place 1 drop into both eyes 4 (four) times daily as needed.  . traMADol (ULTRAM) 50 MG tablet Take 50 mg by mouth 2 (two) times daily as needed.  Marland Kitchen lisinopril (PRINIVIL,ZESTRIL) 20 MG tablet Take 1 tablet daily for BP   No current facility-administered medications on file prior to visit.     Problem list He has Hyperlipidemia; Hypertension; Vitamin D deficiency; Allergy; Prediabetes; Asthma; Hearing loss;  Sinus bradycardia; Situational anxiety; BMI 27.0-27.9,adult; and Personal history of malignant melanoma on his problem list.  Review of Systems  Constitutional: Negative.  Negative for chills and fever.  Respiratory: Negative.   Cardiovascular: Negative.   Gastrointestinal: Negative.        Suprapubic pain  Genitourinary: Positive for dysuria, frequency and urgency. Negative for decreased urine volume, difficulty urinating, discharge, enuresis, flank pain, genital sores, hematuria, penile pain, scrotal swelling and testicular pain.  Musculoskeletal: Negative.  Negative for back pain.  Skin: Negative.  Negative for rash.       Objective:   Physical Exam  Constitutional: He is oriented to person, place, and time. No distress.  Pulmonary/Chest: Effort normal and breath sounds normal. He has no wheezes.  Abdominal: Soft. Bowel sounds are normal. He exhibits no distension. There is tenderness in the suprapubic area. There is no rebound, no guarding and no CVA tenderness.  Lymphadenopathy:       Right: No inguinal adenopathy present.       Left: No inguinal adenopathy present.  Neurological: He is alert and oriented to person, place, and time. He has normal reflexes.  Skin: Skin is warm and dry. No rash noted.       Assessment & Plan:  Urinary tract infection likely Less likely prostate infection, kidney stone, pyelonephritis- no fever, chills, no flank pain/back pain, no LUTS symptoms- more likely UTI Will check urine and treat with Bactrim- if  not better follow up for prostate exam/labs The patient was advised to call immediately if he has any concerning symptoms in the interval. The patient voices understanding of current treatment options and is in agreement with the current care plan.

## 2016-09-15 LAB — URINALYSIS, MICROSCOPIC ONLY
Bacteria, UA: NONE SEEN [HPF]
CRYSTALS: NONE SEEN [HPF]
Casts: NONE SEEN [LPF]
Squamous Epithelial / LPF: NONE SEEN [HPF] (ref <=5)
Yeast: NONE SEEN [HPF]

## 2016-09-15 LAB — URINALYSIS, ROUTINE W REFLEX MICROSCOPIC
Bilirubin Urine: NEGATIVE
Glucose, UA: NEGATIVE
Hgb urine dipstick: NEGATIVE
Ketones, ur: NEGATIVE
NITRITE: NEGATIVE
Protein, ur: NEGATIVE
Specific Gravity, Urine: 1.017 (ref 1.001–1.035)
pH: 5.5 (ref 5.0–8.0)

## 2016-09-15 LAB — URINE CULTURE: ORGANISM ID, BACTERIA: NO GROWTH

## 2016-09-16 ENCOUNTER — Other Ambulatory Visit: Payer: Self-pay | Admitting: Physician Assistant

## 2016-09-16 ENCOUNTER — Encounter: Payer: Self-pay | Admitting: Physician Assistant

## 2016-09-16 DIAGNOSIS — N3000 Acute cystitis without hematuria: Secondary | ICD-10-CM

## 2016-09-16 MED ORDER — SULFAMETHOXAZOLE-TRIMETHOPRIM 800-160 MG PO TABS: 1.0000 | ORAL_TABLET | Freq: Two times a day (BID) | ORAL | 0 refills | Status: DC

## 2016-11-24 ENCOUNTER — Telehealth: Payer: Self-pay

## 2016-11-24 ENCOUNTER — Encounter: Payer: Self-pay | Admitting: Physician Assistant

## 2016-11-24 ENCOUNTER — Ambulatory Visit (INDEPENDENT_AMBULATORY_CARE_PROVIDER_SITE_OTHER): Payer: 59 | Admitting: Physician Assistant

## 2016-11-24 VITALS — BP 124/70 | HR 59 | Temp 97.5°F | Resp 16 | Ht 67.5 in | Wt 185.2 lb

## 2016-11-24 DIAGNOSIS — E785 Hyperlipidemia, unspecified: Secondary | ICD-10-CM

## 2016-11-24 DIAGNOSIS — I10 Essential (primary) hypertension: Secondary | ICD-10-CM | POA: Diagnosis not present

## 2016-11-24 DIAGNOSIS — Z79899 Other long term (current) drug therapy: Secondary | ICD-10-CM

## 2016-11-24 DIAGNOSIS — E559 Vitamin D deficiency, unspecified: Secondary | ICD-10-CM | POA: Diagnosis not present

## 2016-11-24 LAB — CBC WITH DIFFERENTIAL/PLATELET
BASOS PCT: 1 %
Basophils Absolute: 63 cells/uL (ref 0–200)
EOS PCT: 3 %
Eosinophils Absolute: 189 cells/uL (ref 15–500)
HEMATOCRIT: 44.3 % (ref 38.5–50.0)
HEMOGLOBIN: 15.5 g/dL (ref 13.2–17.1)
LYMPHS ABS: 2394 {cells}/uL (ref 850–3900)
Lymphocytes Relative: 38 %
MCH: 32.2 pg (ref 27.0–33.0)
MCHC: 35 g/dL (ref 32.0–36.0)
MCV: 92.1 fL (ref 80.0–100.0)
MONO ABS: 693 {cells}/uL (ref 200–950)
MPV: 10.3 fL (ref 7.5–12.5)
Monocytes Relative: 11 %
NEUTROS ABS: 2961 {cells}/uL (ref 1500–7800)
Neutrophils Relative %: 47 %
Platelets: 257 10*3/uL (ref 140–400)
RBC: 4.81 MIL/uL (ref 4.20–5.80)
RDW: 13.8 % (ref 11.0–15.0)
WBC: 6.3 10*3/uL (ref 3.8–10.8)

## 2016-11-24 LAB — HEPATIC FUNCTION PANEL
ALK PHOS: 51 U/L (ref 40–115)
ALT: 28 U/L (ref 9–46)
AST: 23 U/L (ref 10–35)
Albumin: 4.2 g/dL (ref 3.6–5.1)
BILIRUBIN DIRECT: 0.2 mg/dL (ref <=0.2)
BILIRUBIN INDIRECT: 0.6 mg/dL (ref 0.2–1.2)
Total Bilirubin: 0.8 mg/dL (ref 0.2–1.2)
Total Protein: 6.7 g/dL (ref 6.1–8.1)

## 2016-11-24 LAB — BASIC METABOLIC PANEL WITH GFR
BUN: 17 mg/dL (ref 7–25)
CALCIUM: 9.4 mg/dL (ref 8.6–10.3)
CHLORIDE: 105 mmol/L (ref 98–110)
CO2: 24 mmol/L (ref 20–31)
Creat: 1.01 mg/dL (ref 0.70–1.25)
GFR, EST NON AFRICAN AMERICAN: 78 mL/min (ref >=60)
GFR, Est African American: >89 mL/min (ref >=60)
Glucose, Bld: 87 mg/dL (ref 65–99)
POTASSIUM: 4.4 mmol/L (ref 3.5–5.3)
SODIUM: 138 mmol/L (ref 135–146)

## 2016-11-24 LAB — LIPID PANEL
CHOL/HDL RATIO: 2.3 ratio (ref <5.0)
CHOLESTEROL: 126 mg/dL (ref <200)
HDL: 55 mg/dL (ref >40)
LDL CALC: 58 mg/dL (ref <100)
Triglycerides: 64 mg/dL (ref <150)
VLDL: 13 mg/dL (ref <30)

## 2016-11-24 LAB — TSH: TSH: 2.07 mIU/L (ref 0.40–4.50)

## 2016-11-24 LAB — MAGNESIUM: Magnesium: 2 mg/dL (ref 1.5–2.5)

## 2016-11-24 MED ORDER — ALPRAZOLAM 0.5 MG PO TABS: 0.5000 mg | ORAL_TABLET | Freq: Three times a day (TID) | ORAL | 0 refills | Status: DC | PRN

## 2016-11-24 NOTE — Progress Notes (Signed)
Assessment and Plan:  Hypertension -Continue medication, monitor blood pressure at home. Continue DASH diet.  Reminder to go to the ER if any CP, SOB, nausea, dizziness, severe HA, changes vision/speech, left arm numbness and tingling and jaw pain.  Cholesterol -Continue diet and exercise. Check cholesterol.   Prediabetes  -Continue diet and exercise. Check A1C  Vitamin D Def - check level and continue medications.   Allergies/asthma Refill medications  Continue diet and meds as discussed. Further disposition pending results of labs. Over 30 minutes of exam, counseling, chart review, and critical decision making was performed Future Appointments Date Time Provider [place]  07/25/2017 9:00 AM Vicie Mutters, PA-C [facility] None    HPI 65 y.o. male  presents for 3 month follow up on hypertension, cholesterol, prediabetes, and vitamin D deficiency.   His blood pressure has been controlled at home, on lisinopril, today their BP is BP: 124/70  He does workout. He denies chest pain, shortness of breath, dizziness. Passed recently kidney stone in Sept.   He is on cholesterol medication, on lipitor every other day and cuts in half and denies myalgias. His cholesterol is at goal. The cholesterol last visit was:   Lab Results  Component Value Date   CHOL 135 07/20/2016   HDL 59 07/20/2016   LDLCALC 60 07/20/2016   TRIG 79 07/20/2016   CHOLHDL 2.3 07/20/2016    He has been working on diet and exercise for prediabetes, and denies paresthesia of the feet, polydipsia, polyuria and visual disturbances. Last A1C in the office was:  Lab Results  Component Value Date   HGBA1C 5.3 10/23/2015   Patient is on Vitamin D supplement.   Lab Results  Component Value Date   VD25OH 58 07/20/2016     BMI is Body mass index is 28.58 kg/m., he is working on diet and exercise. Wt Readings from Last 3 Encounters:  11/24/16 185 lb 3.2 oz (84 kg)  09/14/16 184 lb (83.5 kg)  07/20/16 182 lb  (82.6 kg)    Current Medications:  Current Outpatient Prescriptions on File Prior to Visit  Medication Sig Dispense Refill  . ALPRAZolam (XANAX) 0.5 MG tablet Take 0.5 mg by mouth 3 (three) times daily as needed for anxiety.    Marland Kitchen atorvastatin (LIPITOR) 20 MG tablet Take 1 tablet (20 mg total) by mouth daily. 90 tablet 3  . azelastine (OPTIVAR) 0.05 % ophthalmic solution Place 1 drop into both eyes 2 (two) times daily as needed. 6 mL PRN  . Cholecalciferol (VITAMIN D PO) Take 4,000 Int'l Units by mouth.    . clindamycin (CLEOCIN) 150 MG capsule Take by mouth 3 (three) times daily.    Marland Kitchen Fexofenadine HCl (ALLEGRA PO) Take by mouth daily.    . fluticasone (FLONASE) 50 MCG/ACT nasal spray USE 2 SPRAYS IN EACH       NOSTRIL DAILY 48 g 3  . ibuprofen (ADVIL,MOTRIN) 800 MG tablet Take 800 mg by mouth every 8 (eight) hours as needed.    . lansoprazole (PREVACID) 30 MG capsule Take 1 capsule (30 mg total) by mouth daily at 12 noon. 90 capsule PRN  . montelukast (SINGULAIR) 10 MG tablet Take 1 tablet (10 mg total) by mouth at bedtime. 90 tablet 3  . phenazopyridine (PYRIDIUM) 200 MG tablet Take 1 tablet (200 mg total) by mouth 3 (three) times daily as needed for pain. For 2 days.  Take after meals. 10 tablet 0  . prednisoLONE acetate (PRED FORTE) 1 % ophthalmic suspension Place  1 drop into both eyes 4 (four) times daily as needed. 5 mL PRN  . traMADol (ULTRAM) 50 MG tablet Take 50 mg by mouth 2 (two) times daily as needed.    Marland Kitchen lisinopril (PRINIVIL,ZESTRIL) 20 MG tablet Take 1 tablet daily for BP 90 tablet 1   No current facility-administered medications on file prior to visit.    Medical History:  Past Medical History:  Diagnosis Date  . Allergy   . Asthma   . Hyperlipidemia   . Hypertension   . Prediabetes   . Vitamin D deficiency    Allergies:  Allergies  Allergen Reactions  . Hismanal [Astemizole]   . Latex   . Penicillins Hives     Review of Systems:  Review of Systems   Constitutional: Negative.  Negative for chills and fever.  HENT: Positive for congestion and hearing loss (wears hearing aids). Negative for ear discharge, ear pain, nosebleeds, sore throat and tinnitus.   Eyes: Negative.   Respiratory: Negative.  Negative for stridor.   Cardiovascular: Negative.   Gastrointestinal: Negative.   Genitourinary: Negative.   Musculoskeletal: Negative.   Skin: Negative.   Neurological: Negative.  Negative for headaches.  Endo/Heme/Allergies: Negative.   Psychiatric/Behavioral: Negative.     Family history- Review and unchanged Social history- Review and unchanged Physical Exam: BP 124/70   Pulse (!) 59   Temp 97.5 F (36.4 C)   Resp 16   Ht 5' 7.5" (1.715 m)   Wt 185 lb 3.2 oz (84 kg)   SpO2 99%   BMI 28.58 kg/m  Wt Readings from Last 3 Encounters:  11/24/16 185 lb 3.2 oz (84 kg)  09/14/16 184 lb (83.5 kg)  07/20/16 182 lb (82.6 kg)   General Appearance: Well nourished, in no apparent distress. Eyes: PERRLA, EOMs, conjunctiva no swelling or erythema Sinuses: No Frontal/maxillary tenderness ENT/Mouth: Ext aud canals clear, TMs without erythema, bulging. No erythema, swelling, or exudate on post pharynx.  Tonsils not swollen or erythematous. Hearing normal.  Neck: Supple, thyroid normal.  Respiratory: Respiratory effort normal, BS equal bilaterally without rales, rhonchi, wheezing or stridor.  Cardio: RRR with no MRGs. Brisk peripheral pulses without edema.  Abdomen: Soft, + BS,  Non tender, no guarding, rebound, hernias, masses. Lymphatics: Non tender without lymphadenopathy.  Musculoskeletal: Full ROM, 5/5 strength, Normal gait Skin: Warm, dry without rashes, lesions, ecchymosis.  Neuro: Cranial nerves intact. Normal muscle tone, no cerebellar symptoms. Psych: Awake and oriented X 3, normal affect, Insight and Judgment appropriate.    Vicie Mutters, PA-C 8:51 AM [facility]

## 2016-11-24 NOTE — Telephone Encounter (Signed)
[facility] @ 9:29AM

## 2016-11-25 LAB — VITAMIN D 25 HYDROXY (VIT D DEFICIENCY, FRACTURES): Vit D, 25-Hydroxy: 59 ng/mL (ref 30–100)

## 2017-02-20 ENCOUNTER — Encounter: Payer: Self-pay | Admitting: Physician Assistant

## 2017-02-21 MED ORDER — LISINOPRIL 20 MG PO TABS: ORAL_TABLET | ORAL | 1 refills | Status: DC

## 2017-03-31 ENCOUNTER — Other Ambulatory Visit: Payer: Self-pay | Admitting: Physician Assistant

## 2017-03-31 ENCOUNTER — Encounter: Payer: Self-pay | Admitting: Physician Assistant

## 2017-03-31 ENCOUNTER — Ambulatory Visit (INDEPENDENT_AMBULATORY_CARE_PROVIDER_SITE_OTHER): Payer: 59 | Admitting: Physician Assistant

## 2017-03-31 VITALS — BP 128/72 | HR 68 | Temp 97.7°F | Resp 16 | Ht 67.5 in | Wt 183.6 lb

## 2017-03-31 DIAGNOSIS — Z6828 Body mass index (BMI) 28.0-28.9, adult: Secondary | ICD-10-CM

## 2017-03-31 DIAGNOSIS — E559 Vitamin D deficiency, unspecified: Secondary | ICD-10-CM | POA: Diagnosis not present

## 2017-03-31 DIAGNOSIS — E785 Hyperlipidemia, unspecified: Secondary | ICD-10-CM

## 2017-03-31 DIAGNOSIS — H9193 Unspecified hearing loss, bilateral: Secondary | ICD-10-CM

## 2017-03-31 DIAGNOSIS — J45909 Unspecified asthma, uncomplicated: Secondary | ICD-10-CM

## 2017-03-31 DIAGNOSIS — N289 Disorder of kidney and ureter, unspecified: Secondary | ICD-10-CM

## 2017-03-31 DIAGNOSIS — I10 Essential (primary) hypertension: Secondary | ICD-10-CM

## 2017-03-31 LAB — CBC WITH DIFFERENTIAL/PLATELET
BASOS PCT: 1 %
Basophils Absolute: 61 cells/uL (ref 0–200)
EOS ABS: 183 {cells}/uL (ref 15–500)
Eosinophils Relative: 3 %
HCT: 45.3 % (ref 38.5–50.0)
Hemoglobin: 15.7 g/dL (ref 13.2–17.1)
LYMPHS PCT: 37 %
Lymphs Abs: 2257 cells/uL (ref 850–3900)
MCH: 31.6 pg (ref 27.0–33.0)
MCHC: 34.7 g/dL (ref 32.0–36.0)
MCV: 91.1 fL (ref 80.0–100.0)
MONO ABS: 610 {cells}/uL (ref 200–950)
MPV: 9.8 fL (ref 7.5–12.5)
Monocytes Relative: 10 %
NEUTROS ABS: 2989 {cells}/uL (ref 1500–7800)
Neutrophils Relative %: 49 %
PLATELETS: 279 10*3/uL (ref 140–400)
RBC: 4.97 MIL/uL (ref 4.20–5.80)
RDW: 13.6 % (ref 11.0–15.0)
WBC: 6.1 10*3/uL (ref 3.8–10.8)

## 2017-03-31 LAB — BASIC METABOLIC PANEL WITH GFR
BUN: 18 mg/dL (ref 7–25)
CHLORIDE: 105 mmol/L (ref 98–110)
CO2: 26 mmol/L (ref 20–31)
Calcium: 9.7 mg/dL (ref 8.6–10.3)
Creat: 1.3 mg/dL — ABNORMAL HIGH (ref 0.70–1.25)
GFR, EST AFRICAN AMERICAN: 66 mL/min (ref >=60)
GFR, EST NON AFRICAN AMERICAN: 57 mL/min — AB (ref >=60)
Glucose, Bld: 93 mg/dL (ref 65–99)
POTASSIUM: 4.3 mmol/L (ref 3.5–5.3)
Sodium: 138 mmol/L (ref 135–146)

## 2017-03-31 LAB — LIPID PANEL
CHOL/HDL RATIO: 2.6 ratio (ref <5.0)
Cholesterol: 133 mg/dL (ref <200)
HDL: 52 mg/dL (ref >40)
LDL CALC: 66 mg/dL (ref <100)
TRIGLYCERIDES: 77 mg/dL (ref <150)
VLDL: 15 mg/dL (ref <30)

## 2017-03-31 LAB — HEPATIC FUNCTION PANEL
ALK PHOS: 52 U/L (ref 40–115)
ALT: 28 U/L (ref 9–46)
AST: 21 U/L (ref 10–35)
Albumin: 4.3 g/dL (ref 3.6–5.1)
BILIRUBIN DIRECT: 0.2 mg/dL (ref <=0.2)
BILIRUBIN INDIRECT: 0.7 mg/dL (ref 0.2–1.2)
BILIRUBIN TOTAL: 0.9 mg/dL (ref 0.2–1.2)
Total Protein: 6.9 g/dL (ref 6.1–8.1)

## 2017-03-31 LAB — TSH: TSH: 1.8 mIU/L (ref 0.40–4.50)

## 2017-03-31 NOTE — Progress Notes (Signed)
Assessment and Plan:  Hypertension -Continue medication, monitor blood pressure at home. Continue DASH diet.  Reminder to go to the ER if any CP, SOB, nausea, dizziness, severe HA, changes vision/speech, left arm numbness and tingling and jaw pain.  Cholesterol -Continue diet and exercise. Check cholesterol.   Vitamin D Def - check level and continue medications.    Overweight  - long discussion about weight loss, diet, and exercise   Continue diet and meds as discussed. Further disposition pending results of labs. Over 30 minutes of exam, counseling, chart review, and critical decision making was performed Future Appointments Date Time Provider [place]  07/25/2017 9:00 AM Vicie Mutters, PA-C [facility] None    HPI 66 y.o. male  presents for 3 month follow up on hypertension, cholesterol, prediabetes, and vitamin D deficiency.   His blood pressure has been controlled at home, on lisinopril, today their BP is BP: 128/72  He does workout. He denies chest pain, shortness of breath, dizziness.   He is on cholesterol medication, on lipitor every other day and cuts in half and denies myalgias. His cholesterol is at goal. The cholesterol last visit was:   Lab Results  Component Value Date   CHOL 126 11/24/2016   HDL 55 11/24/2016   LDLCALC 58 11/24/2016   TRIG 64 11/24/2016   CHOLHDL 2.3 11/24/2016    He has been working on diet and exercise for prediabetes, and denies paresthesia of the feet, polydipsia, polyuria and visual disturbances. Last A1C in the office was:  Lab Results  Component Value Date   HGBA1C 5.3 10/23/2015   Patient is on Vitamin D supplement.   Lab Results  Component Value Date   VD25OH 59 11/24/2016     BMI is Body mass index is 28.33 kg/m., he is working on diet and exercise. Wt Readings from Last 3 Encounters:  03/31/17 183 lb 9.6 oz (83.3 kg)  11/24/16 185 lb 3.2 oz (84 kg)  09/14/16 184 lb (83.5 kg)    Current Medications:  Current  Outpatient Prescriptions on File Prior to Visit  Medication Sig Dispense Refill  . ALPRAZolam (XANAX) 0.5 MG tablet Take 1 tablet (0.5 mg total) by mouth 3 (three) times daily as needed for anxiety. 30 tablet 0  . atorvastatin (LIPITOR) 20 MG tablet Take 1 tablet (20 mg total) by mouth daily. 90 tablet 3  . azelastine (OPTIVAR) 0.05 % ophthalmic solution Place 1 drop into both eyes 2 (two) times daily as needed. 6 mL PRN  . Cholecalciferol (VITAMIN D PO) Take 4,000 Int'l Units by mouth.    . clindamycin (CLEOCIN) 150 MG capsule Take by mouth 3 (three) times daily.    Marland Kitchen Fexofenadine HCl (ALLEGRA PO) Take by mouth daily.    . fluticasone (FLONASE) 50 MCG/ACT nasal spray USE 2 SPRAYS IN EACH       NOSTRIL DAILY 48 g 3  . ibuprofen (ADVIL,MOTRIN) 800 MG tablet Take 800 mg by mouth every 8 (eight) hours as needed.    . lansoprazole (PREVACID) 30 MG capsule Take 1 capsule (30 mg total) by mouth daily at 12 noon. 90 capsule PRN  . lisinopril (PRINIVIL,ZESTRIL) 20 MG tablet Take 1 tablet daily for BP 90 tablet 1  . montelukast (SINGULAIR) 10 MG tablet Take 1 tablet (10 mg total) by mouth at bedtime. 90 tablet 3  . phenazopyridine (PYRIDIUM) 200 MG tablet Take 1 tablet (200 mg total) by mouth 3 (three) times daily as needed for pain. For 2 days.  Take after meals. 10 tablet 0  . prednisoLONE acetate (PRED FORTE) 1 % ophthalmic suspension Place 1 drop into both eyes 4 (four) times daily as needed. 5 mL PRN  . traMADol (ULTRAM) 50 MG tablet Take 50 mg by mouth 2 (two) times daily as needed.     No current facility-administered medications on file prior to visit.    Medical History:  Past Medical History:  Diagnosis Date  . Allergy   . Asthma   . Hyperlipidemia   . Hypertension   . Prediabetes   . Vitamin D deficiency    Allergies:  Allergies  Allergen Reactions  . Hismanal [Astemizole]   . Latex   . Penicillins Hives     Review of Systems:  Review of Systems  Constitutional: Negative.   Negative for chills and fever.  HENT: Positive for congestion and hearing loss (wears hearing aids). Negative for ear discharge, ear pain, nosebleeds, sore throat and tinnitus.   Eyes: Negative.   Respiratory: Negative.  Negative for stridor.   Cardiovascular: Negative.   Gastrointestinal: Negative.   Genitourinary: Negative.   Musculoskeletal: Negative.   Skin: Negative.   Neurological: Negative.  Negative for headaches.  Endo/Heme/Allergies: Negative.   Psychiatric/Behavioral: Negative.     Family history- Review and unchanged Social history- Review and unchanged Physical Exam: BP 128/72   Pulse 68   Temp 97.7 F (36.5 C)   Resp 16   Ht 5' 7.5" (1.715 m)   Wt 183 lb 9.6 oz (83.3 kg)   SpO2 96%   BMI 28.33 kg/m  Wt Readings from Last 3 Encounters:  03/31/17 183 lb 9.6 oz (83.3 kg)  11/24/16 185 lb 3.2 oz (84 kg)  09/14/16 184 lb (83.5 kg)   General Appearance: Well nourished, in no apparent distress. Eyes: PERRLA, EOMs, conjunctiva no swelling or erythema Sinuses: No Frontal/maxillary tenderness ENT/Mouth: Ext aud canals clear, TMs without erythema, bulging. No erythema, swelling, or exudate on post pharynx.  Tonsils not swollen or erythematous. Hearing normal.  Neck: Supple, thyroid normal.  Respiratory: Respiratory effort normal, BS equal bilaterally without rales, rhonchi, wheezing or stridor.  Cardio: RRR with no MRGs. Brisk peripheral pulses without edema.  Abdomen: Soft, + BS,  Non tender, no guarding, rebound, hernias, masses. Lymphatics: Non tender without lymphadenopathy.  Musculoskeletal: Full ROM, 5/5 strength, Normal gait Skin: Warm, dry without rashes, lesions, ecchymosis.  Neuro: Cranial nerves intact. Normal muscle tone, no cerebellar symptoms. Psych: Awake and oriented X 3, normal affect, Insight and Judgment appropriate.    Vicie Mutters, PA-C 8:47 AM [facility]

## 2017-04-04 ENCOUNTER — Encounter: Payer: Self-pay | Admitting: Internal Medicine

## 2017-05-03 ENCOUNTER — Other Ambulatory Visit: Payer: Medicare Other

## 2017-05-03 DIAGNOSIS — N289 Disorder of kidney and ureter, unspecified: Secondary | ICD-10-CM

## 2017-05-03 LAB — BASIC METABOLIC PANEL WITH GFR
BUN: 17 mg/dL (ref 7–25)
CHLORIDE: 106 mmol/L (ref 98–110)
CO2: 24 mmol/L (ref 20–31)
CREATININE: 1.32 mg/dL — AB (ref 0.70–1.25)
Calcium: 9.2 mg/dL (ref 8.6–10.3)
GFR, Est African American: 65 mL/min (ref >=60)
GFR, Est Non African American: 56 mL/min — ABNORMAL LOW (ref >=60)
Glucose, Bld: 108 mg/dL — ABNORMAL HIGH (ref 65–99)
Potassium: 3.8 mmol/L (ref 3.5–5.3)
Sodium: 139 mmol/L (ref 135–146)

## 2017-05-04 ENCOUNTER — Encounter: Payer: Self-pay | Admitting: Physician Assistant

## 2017-05-05 ENCOUNTER — Other Ambulatory Visit: Payer: Self-pay | Admitting: Physician Assistant

## 2017-05-05 DIAGNOSIS — Z79899 Other long term (current) drug therapy: Secondary | ICD-10-CM

## 2017-06-07 ENCOUNTER — Other Ambulatory Visit: Payer: Medicare Other

## 2017-06-07 DIAGNOSIS — Z79899 Other long term (current) drug therapy: Secondary | ICD-10-CM

## 2017-06-07 LAB — BASIC METABOLIC PANEL WITH GFR
BUN: 15 mg/dL (ref 7–25)
CALCIUM: 9.2 mg/dL (ref 8.6–10.3)
CHLORIDE: 105 mmol/L (ref 98–110)
CO2: 22 mmol/L (ref 20–31)
CREATININE: 0.99 mg/dL (ref 0.70–1.25)
GFR, Est African American: >89 mL/min (ref >=60)
GFR, Est Non African American: 80 mL/min (ref >=60)
GLUCOSE: 118 mg/dL — AB (ref 65–99)
Potassium: 4 mmol/L (ref 3.5–5.3)
Sodium: 138 mmol/L (ref 135–146)

## 2017-06-30 ENCOUNTER — Encounter: Payer: Self-pay | Admitting: Physician Assistant

## 2017-07-01 ENCOUNTER — Other Ambulatory Visit: Payer: Self-pay | Admitting: Physician Assistant

## 2017-07-01 DIAGNOSIS — Z1211 Encounter for screening for malignant neoplasm of colon: Secondary | ICD-10-CM

## 2017-07-25 ENCOUNTER — Encounter: Payer: Self-pay | Admitting: Physician Assistant

## 2017-07-25 ENCOUNTER — Encounter: Payer: Self-pay | Admitting: Internal Medicine

## 2017-07-25 ENCOUNTER — Ambulatory Visit (INDEPENDENT_AMBULATORY_CARE_PROVIDER_SITE_OTHER): Payer: 59 | Admitting: Physician Assistant

## 2017-07-25 VITALS — BP 126/80 | HR 55 | Temp 97.5°F | Resp 14 | Ht 68.0 in | Wt 181.0 lb

## 2017-07-25 DIAGNOSIS — Z125 Encounter for screening for malignant neoplasm of prostate: Secondary | ICD-10-CM

## 2017-07-25 DIAGNOSIS — Z136 Encounter for screening for cardiovascular disorders: Secondary | ICD-10-CM

## 2017-07-25 DIAGNOSIS — Z85820 Personal history of malignant melanoma of skin: Secondary | ICD-10-CM

## 2017-07-25 DIAGNOSIS — T7840XD Allergy, unspecified, subsequent encounter: Secondary | ICD-10-CM

## 2017-07-25 DIAGNOSIS — E785 Hyperlipidemia, unspecified: Secondary | ICD-10-CM

## 2017-07-25 DIAGNOSIS — Z130 Encounter for screening for diseases of the blood and blood-forming organs and certain disorders involving the immune mechanism: Secondary | ICD-10-CM

## 2017-07-25 DIAGNOSIS — Z0000 Encounter for general adult medical examination without abnormal findings: Secondary | ICD-10-CM

## 2017-07-25 DIAGNOSIS — Z0001 Encounter for general adult medical examination with abnormal findings: Secondary | ICD-10-CM

## 2017-07-25 DIAGNOSIS — Z23 Encounter for immunization: Secondary | ICD-10-CM | POA: Diagnosis not present

## 2017-07-25 DIAGNOSIS — Z6827 Body mass index (BMI) 27.0-27.9, adult: Secondary | ICD-10-CM

## 2017-07-25 DIAGNOSIS — J45909 Unspecified asthma, uncomplicated: Secondary | ICD-10-CM

## 2017-07-25 DIAGNOSIS — R7303 Prediabetes: Secondary | ICD-10-CM

## 2017-07-25 DIAGNOSIS — I10 Essential (primary) hypertension: Secondary | ICD-10-CM

## 2017-07-25 DIAGNOSIS — E559 Vitamin D deficiency, unspecified: Secondary | ICD-10-CM

## 2017-07-25 DIAGNOSIS — H9193 Unspecified hearing loss, bilateral: Secondary | ICD-10-CM

## 2017-07-25 DIAGNOSIS — F418 Other specified anxiety disorders: Secondary | ICD-10-CM

## 2017-07-25 DIAGNOSIS — R001 Bradycardia, unspecified: Secondary | ICD-10-CM

## 2017-07-25 DIAGNOSIS — Z79899 Other long term (current) drug therapy: Secondary | ICD-10-CM

## 2017-07-25 LAB — BASIC METABOLIC PANEL WITH GFR
BUN: 16 mg/dL (ref 7–25)
CHLORIDE: 104 mmol/L (ref 98–110)
CO2: 26 mmol/L (ref 20–32)
Calcium: 9.8 mg/dL (ref 8.6–10.3)
Creat: 1.05 mg/dL (ref 0.70–1.25)
GFR, Est African American: 86 mL/min (ref >=60)
GFR, Est Non African American: 74 mL/min (ref >=60)
GLUCOSE: 93 mg/dL (ref 65–99)
POTASSIUM: 4.6 mmol/L (ref 3.5–5.3)
Sodium: 139 mmol/L (ref 135–146)

## 2017-07-25 LAB — HEPATIC FUNCTION PANEL
ALBUMIN: 4.4 g/dL (ref 3.6–5.1)
ALK PHOS: 59 U/L (ref 40–115)
ALT: 25 U/L (ref 9–46)
AST: 22 U/L (ref 10–35)
BILIRUBIN TOTAL: 0.8 mg/dL (ref 0.2–1.2)
Bilirubin, Direct: 0.2 mg/dL (ref <=0.2)
Indirect Bilirubin: 0.6 mg/dL (ref 0.2–1.2)
TOTAL PROTEIN: 7 g/dL (ref 6.1–8.1)

## 2017-07-25 LAB — LIPID PANEL
Cholesterol: 140 mg/dL (ref <200)
HDL: 53 mg/dL (ref >40)
LDL CALC: 69 mg/dL (ref <100)
Total CHOL/HDL Ratio: 2.6 Ratio (ref <5.0)
Triglycerides: 90 mg/dL (ref <150)
VLDL: 18 mg/dL (ref <30)

## 2017-07-25 LAB — CBC WITH DIFFERENTIAL/PLATELET
BASOS PCT: 1 %
Basophils Absolute: 62 cells/uL (ref 0–200)
EOS ABS: 310 {cells}/uL (ref 15–500)
Eosinophils Relative: 5 %
HEMATOCRIT: 47.8 % (ref 38.5–50.0)
HEMOGLOBIN: 16.6 g/dL (ref 13.2–17.1)
Lymphocytes Relative: 36 %
Lymphs Abs: 2232 cells/uL (ref 850–3900)
MCH: 32.4 pg (ref 27.0–33.0)
MCHC: 34.7 g/dL (ref 32.0–36.0)
MCV: 93.2 fL (ref 80.0–100.0)
MONO ABS: 682 {cells}/uL (ref 200–950)
MPV: 10 fL (ref 7.5–12.5)
Monocytes Relative: 11 %
NEUTROS ABS: 2914 {cells}/uL (ref 1500–7800)
Neutrophils Relative %: 47 %
Platelets: 283 10*3/uL (ref 140–400)
RBC: 5.13 MIL/uL (ref 4.20–5.80)
RDW: 13.2 % (ref 11.0–15.0)
WBC: 6.2 10*3/uL (ref 3.8–10.8)

## 2017-07-25 LAB — TSH: TSH: 1.65 mIU/L (ref 0.40–4.50)

## 2017-07-25 LAB — IRON AND TIBC
%SAT: 49 % (ref 15–60)
IRON: 168 ug/dL (ref 50–180)
TIBC: 341 ug/dL (ref 250–425)
UIBC: 173 ug/dL
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
## 2017-07-25 NOTE — Patient Instructions (Addendum)
Call colonoscopy Phone: [phone]  Drink 80-100 oz a day of water, measure it out Eat 3 meals a day, have to do breakfast, eat protein- hard boiled eggs, protein bar like nature [place] protein bar, greek yogurt like oikos triple zero, chobani 100, or light n fit greek   Simple math prevails.    1st - exercise does not produce significant weight loss - at best one converts fat into muscle , "bulks up", loses inches, but usually stays "weight neutral"     2nd - think of your body weightas a check book: If you eat more calories than you burn up - you save money or gain weight .... Or if you spend more money than you put in the check book, ie burn up more calories than you eat, then you lose weight     3rd - if you walk or run 1 mile, you burn up 100 calories - you have to burn up 3,500 calories to lose 1 pound, ie you have to walk/run 35 miles to lose 1 measly pound. So if you want to lose 10 #, then you have to walk/run 350 miles, so.... clearly exercise is not the solution.     4. So if you consume 1,500 calories, then you have to burn up the equivalent of 15 miles to stay weight neutral - It also stands to reason that if you consume 1,500 cal/day and don't lose weight, then you must be burning up about 1,500 cals/day to stay weight neutral.     5. If you really want to lose weight, you must cut your calorie intake 300 calories /day and at that rate you should lose about 1 # every 3 days.   6. Please purchase Dr Fara Olden Fuhrman's book(s) "The End of Dieting" & "Eat to Live" . It has some great concepts and recipes.

## 2017-07-26 LAB — URINALYSIS, ROUTINE W REFLEX MICROSCOPIC
Bilirubin Urine: NEGATIVE
GLUCOSE, UA: NEGATIVE
Hgb urine dipstick: NEGATIVE
Ketones, ur: NEGATIVE
NITRITE: NEGATIVE
Protein, ur: NEGATIVE
SPECIFIC GRAVITY, URINE: 1.015 (ref 1.001–1.035)
pH: 6 (ref 5.0–8.0)

## 2017-07-26 LAB — MAGNESIUM: MAGNESIUM: 2.2 mg/dL (ref 1.5–2.5)

## 2017-07-26 LAB — URINALYSIS, MICROSCOPIC ONLY
BACTERIA UA: NONE SEEN [HPF]
Casts: NONE SEEN [LPF]
Crystals: NONE SEEN [HPF]
SQUAMOUS EPITHELIAL / LPF: NONE SEEN [HPF] (ref <=5)
Yeast: NONE SEEN [HPF]

## 2017-07-26 LAB — MICROALBUMIN / CREATININE URINE RATIO
Creatinine, Urine: 136 mg/dL (ref 20–370)
MICROALB UR: 0.6 mg/dL
MICROALB/CREAT RATIO: 4 ug/mg{creat} (ref <30)

## 2017-07-26 LAB — VITAMIN D 25 HYDROXY (VIT D DEFICIENCY, FRACTURES): Vit D, 25-Hydroxy: 50 ng/mL (ref 30–100)

## 2017-07-26 LAB — HEMOGLOBIN A1C
Hgb A1c MFr Bld: 5.5 % (ref <5.7)
Mean Plasma Glucose: 111 mg/dL

## 2017-07-26 LAB — VITAMIN B12: Vitamin B-12: 468 pg/mL (ref 200–1100)

## 2017-07-26 LAB — PSA: PSA: 0.5 ng/mL (ref <=4.0)

## 2017-07-27 ENCOUNTER — Encounter: Payer: Self-pay | Admitting: Internal Medicine

## 2017-08-08 ENCOUNTER — Encounter: Payer: Self-pay | Admitting: Physician Assistant

## 2017-09-23 ENCOUNTER — Encounter: Payer: Self-pay | Admitting: *Deleted

## 2017-09-23 ENCOUNTER — Ambulatory Visit (AMBULATORY_SURGERY_CENTER): Payer: Self-pay | Admitting: *Deleted

## 2017-09-23 VITALS — Ht 67.0 in | Wt 185.2 lb

## 2017-09-23 DIAGNOSIS — Z1211 Encounter for screening for malignant neoplasm of colon: Secondary | ICD-10-CM

## 2017-09-23 MED ORDER — NA SULFATE-K SULFATE-MG SULF 17.5-3.13-1.6 GM/177ML PO SOLN
1.0000 [IU] | Freq: Once | ORAL | 0 refills | Status: AC
Start: 2017-09-23 — End: 2017-09-23

## 2017-09-23 NOTE — Progress Notes (Signed)
No egg or soy allergy known to patient  No issues with past sedation with any surgeries  or procedures, no intubation problems  No diet pills per patient No home 02 use per patient  No blood thinners per patient  Pt denies issues with constipation  No A fib or A flutter  EMMI video sent to pt's e mail . Pt Declined  

## 2017-09-25 ENCOUNTER — Encounter: Payer: Self-pay | Admitting: Physician Assistant

## 2017-09-26 MED ORDER — ATORVASTATIN CALCIUM 20 MG PO TABS: 20.0000 mg | ORAL_TABLET | Freq: Every day | ORAL | 0 refills | Status: DC

## 2017-10-07 ENCOUNTER — Ambulatory Visit (AMBULATORY_SURGERY_CENTER): Payer: Medicare Other | Admitting: Internal Medicine

## 2017-10-07 ENCOUNTER — Encounter: Payer: Self-pay | Admitting: Internal Medicine

## 2017-10-07 VITALS — BP 139/83 | HR 53 | Temp 97.1°F | Resp 11 | Ht 67.0 in | Wt 185.0 lb

## 2017-10-07 DIAGNOSIS — D122 Benign neoplasm of ascending colon: Secondary | ICD-10-CM | POA: Diagnosis not present

## 2017-10-07 DIAGNOSIS — D123 Benign neoplasm of transverse colon: Secondary | ICD-10-CM | POA: Diagnosis not present

## 2017-10-07 DIAGNOSIS — D125 Benign neoplasm of sigmoid colon: Secondary | ICD-10-CM | POA: Diagnosis not present

## 2017-10-07 DIAGNOSIS — Z1211 Encounter for screening for malignant neoplasm of colon: Secondary | ICD-10-CM | POA: Diagnosis not present

## 2017-10-07 DIAGNOSIS — Z1212 Encounter for screening for malignant neoplasm of rectum: Secondary | ICD-10-CM

## 2017-10-07 DIAGNOSIS — J45909 Unspecified asthma, uncomplicated: Secondary | ICD-10-CM | POA: Diagnosis not present

## 2017-10-07 DIAGNOSIS — I10 Essential (primary) hypertension: Secondary | ICD-10-CM | POA: Diagnosis not present

## 2017-10-07 DIAGNOSIS — K635 Polyp of colon: Secondary | ICD-10-CM | POA: Diagnosis not present

## 2017-10-07 MED ORDER — SODIUM CHLORIDE 0.9 % IV SOLN: 500.0000 mL | INTRAVENOUS | Status: DC

## 2017-10-07 NOTE — Op Note (Signed)
[facility] Patient Name: Barry Flynn Procedure Date: 10/07/2017 11:34 AM MRN: [id number] Endoscopist: Docia Chuck. Henrene Pastor , MD Age: 66 Referring MD:  Date of Birth: December 15, 1951 Gender: Male Account #: [id number] Procedure:                Colonoscopy, with cold snare polypectomy x 4 Indications:              Screening for colorectal malignant neoplasm. Prior                            negative examinations 2002 and 2008 Medicines:                Monitored Anesthesia Care Procedure:                Pre-Anesthesia Assessment:                           - Prior to the procedure, a History and Physical                            was performed, and patient medications and                            allergies were reviewed. The patient's tolerance of                            previous anesthesia was also reviewed. The risks                            and benefits of the procedure and the sedation                            options and risks were discussed with the patient.                            All questions were answered, and informed consent                            was obtained. Prior Anticoagulants: The patient has                            taken no previous anticoagulant or antiplatelet                            agents. ASA Grade Assessment: II - A patient with                            mild systemic disease. After reviewing the risks                            and benefits, the patient was deemed in                            satisfactory condition to undergo the procedure.  After obtaining informed consent, the colonoscope                            was passed under direct vision. Throughout the                            procedure, the patient's blood pressure, pulse, and                            oxygen saturations were monitored continuously. The                            Colonoscope was introduced through the anus and       advanced to the the cecum, identified by                            appendiceal orifice and ileocecal valve. The                            ileocecal valve, appendiceal orifice, and rectum                            were photographed. The quality of the bowel                            preparation was excellent. The colonoscopy was                            performed without difficulty. The patient tolerated                            the procedure well. The bowel preparation used was                            SUPREP. Scope In: 11:44:46 AM Scope Out: 12:03:34 PM Scope Withdrawal Time: 0 hours 16 minutes 48 seconds  Total Procedure Duration: 0 hours 18 minutes 48 seconds  Findings:                 Four polyps were found in the sigmoid colon,                            transverse colon and ascending colon. The polyps                            were 1 to 5 mm in size. These polyps were removed                            with a cold snare. Resection and retrieval were                            complete.                           Multiple small-mouthed diverticula  were found in                            the sigmoid colon.                           Internal hemorrhoids were found during                            retroflexion. The hemorrhoids were moderate.                           The exam was otherwise without abnormality on                            direct and retroflexion views. Complications:            No immediate complications. Estimated blood loss:                            None. Estimated Blood Loss:     Estimated blood loss: none. Impression:               - Four 1 to 5 mm polyps in the sigmoid colon, in                            the transverse colon and in the ascending colon,                            removed with a cold snare. Resected and retrieved.                           - Diverticulosis in the sigmoid colon.                           - Internal hemorrhoids.                            - The examination was otherwise normal on direct                            and retroflexion views. Recommendation:           - Repeat colonoscopy in 5 years for surveillance.                           - Patient has a contact number available for                            emergencies. The signs and symptoms of potential                            delayed complications were discussed with the                            patient. Return to normal activities tomorrow.  Written discharge instructions were provided to the                            patient.                           - Resume previous diet.                           - Continue present medications.                           - Await pathology results. Docia Chuck. Henrene Pastor, MD 10/07/2017 12:09:50 PM This report has been signed electronically.

## 2017-10-07 NOTE — Progress Notes (Signed)
To recovery, report to RN, VSS. 

## 2017-10-07 NOTE — Progress Notes (Signed)
Called to room to assist during endoscopic procedure.  Patient ID and intended procedure confirmed with present staff. Received instructions for my participation in the procedure from the performing physician.  

## 2017-10-07 NOTE — Patient Instructions (Signed)
YOU HAD AN ENDOSCOPIC PROCEDURE TODAY AT THE [facility]:   Refer to the procedure report that was given to you for any specific questions about what was found during the examination.  If the procedure report does not answer your questions, please call your gastroenterologist to clarify.  If you requested that your care partner not be given the details of your procedure findings, then the procedure report has been included in a sealed envelope for you to review at your convenience later.  YOU SHOULD EXPECT: Some feelings of bloating in the abdomen. Passage of more gas than usual.  Walking can help get rid of the air that was put into your GI tract during the procedure and reduce the bloating. If you had a lower endoscopy (such as a colonoscopy or flexible sigmoidoscopy) you may notice spotting of blood in your stool or on the toilet paper. If you underwent a bowel prep for your procedure, you may not have a normal bowel movement for a few days.  Please Note:  You might notice some irritation and congestion in your nose or some drainage.  This is from the oxygen used during your procedure.  There is no need for concern and it should clear up in a day or so.  SYMPTOMS TO REPORT IMMEDIATELY:   Following lower endoscopy (colonoscopy or flexible sigmoidoscopy):  Excessive amounts of blood in the stool  Significant tenderness or worsening of abdominal pains  Swelling of the abdomen that is new, acute  Fever of 100F or higher    For urgent or emergent issues, a gastroenterologist can be reached at any hour by calling [phone].   DIET:  We do recommend a small meal at first, but then you may proceed to your regular diet.  Drink plenty of fluids but you should avoid alcoholic beverages for 24 hours.  ACTIVITY:  You should plan to take it easy for the rest of today and you should NOT DRIVE or use heavy machinery until tomorrow (because of the sedation medicines used during the test).     FOLLOW UP: Our staff will call the number listed on your records the next business day following your procedure to check on you and address any questions or concerns that you may have regarding the information given to you following your procedure. If we do not reach you, we will leave a message.  However, if you are feeling well and you are not experiencing any problems, there is no need to return our call.  We will assume that you have returned to your regular daily activities without incident.  If any biopsies were taken you will be contacted by phone or by letter within the next 1-3 weeks.  Please call us at [phone] if you have not heard about the biopsies in 3 weeks.    SIGNATURES/CONFIDENTIALITY: You and/or your care partner have signed paperwork which will be entered into your electronic medical record.  These signatures attest to the fact that that the information above on your After Visit Summary has been reviewed and is understood.  Full responsibility of the confidentiality of this discharge information lies with you and/or your care-partner  Polyp and hemorrhoid information given.. 

## 2017-10-10 ENCOUNTER — Telehealth: Payer: Self-pay | Admitting: *Deleted

## 2017-10-10 NOTE — Telephone Encounter (Signed)
  Follow up Call-  Call back number 10/07/2017  Post procedure Call Back phone  # [phone]  Permission to leave phone message Yes  Some recent data might be hidden     Patient questions:  Do you have a fever, pain , or abdominal swelling? No. Pain Score  0 *  Have you tolerated food without any problems? Yes.    Have you been able to return to your normal activities? Yes.    Do you have any questions about your discharge instructions: Diet   No. Medications  No. Follow up visit  No.  Do you have questions or concerns about your Care? No.  Actions: * If pain score is 4 or above: No action needed, pain <4.

## 2017-10-11 ENCOUNTER — Encounter: Payer: Self-pay | Admitting: Internal Medicine

## 2017-10-12 ENCOUNTER — Ambulatory Visit (INDEPENDENT_AMBULATORY_CARE_PROVIDER_SITE_OTHER): Payer: Medicare Other

## 2017-10-12 ENCOUNTER — Ambulatory Visit: Payer: Medicare Other

## 2017-10-12 DIAGNOSIS — Z23 Encounter for immunization: Secondary | ICD-10-CM | POA: Diagnosis not present

## 2017-10-12 NOTE — Progress Notes (Signed)
Patient presents for HD Flu vaccine. Injection was given in the left arm. Patient tolerated injection well without any complications. No questions or concerns

## 2017-11-25 ENCOUNTER — Ambulatory Visit: Payer: Self-pay | Admitting: Physician Assistant
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
## 2017-12-01 ENCOUNTER — Ambulatory Visit (INDEPENDENT_AMBULATORY_CARE_PROVIDER_SITE_OTHER): Payer: Medicare Other | Admitting: Physician Assistant

## 2017-12-01 ENCOUNTER — Encounter: Payer: Self-pay | Admitting: Physician Assistant

## 2017-12-01 VITALS — BP 120/82 | HR 55 | Temp 97.5°F | Resp 14 | Ht 67.0 in | Wt 187.2 lb

## 2017-12-01 DIAGNOSIS — J45909 Unspecified asthma, uncomplicated: Secondary | ICD-10-CM

## 2017-12-01 DIAGNOSIS — Z85820 Personal history of malignant melanoma of skin: Secondary | ICD-10-CM

## 2017-12-01 DIAGNOSIS — H9193 Unspecified hearing loss, bilateral: Secondary | ICD-10-CM | POA: Diagnosis not present

## 2017-12-01 DIAGNOSIS — Z0001 Encounter for general adult medical examination with abnormal findings: Secondary | ICD-10-CM

## 2017-12-01 DIAGNOSIS — F418 Other specified anxiety disorders: Secondary | ICD-10-CM | POA: Diagnosis not present

## 2017-12-01 DIAGNOSIS — E559 Vitamin D deficiency, unspecified: Secondary | ICD-10-CM

## 2017-12-01 DIAGNOSIS — R7303 Prediabetes: Secondary | ICD-10-CM

## 2017-12-01 DIAGNOSIS — R6889 Other general symptoms and signs: Secondary | ICD-10-CM

## 2017-12-01 DIAGNOSIS — I10 Essential (primary) hypertension: Secondary | ICD-10-CM | POA: Diagnosis not present

## 2017-12-01 DIAGNOSIS — Z79899 Other long term (current) drug therapy: Secondary | ICD-10-CM | POA: Diagnosis not present

## 2017-12-01 DIAGNOSIS — Z6829 Body mass index (BMI) 29.0-29.9, adult: Secondary | ICD-10-CM

## 2017-12-01 DIAGNOSIS — Z136 Encounter for screening for cardiovascular disorders: Secondary | ICD-10-CM | POA: Diagnosis not present

## 2017-12-01 DIAGNOSIS — E785 Hyperlipidemia, unspecified: Secondary | ICD-10-CM

## 2017-12-01 DIAGNOSIS — R001 Bradycardia, unspecified: Secondary | ICD-10-CM

## 2017-12-01 DIAGNOSIS — T7840XD Allergy, unspecified, subsequent encounter: Secondary | ICD-10-CM

## 2017-12-01 DIAGNOSIS — Z0000 Encounter for general adult medical examination without abnormal findings: Secondary | ICD-10-CM

## 2017-12-01 LAB — CBC WITH DIFFERENTIAL/PLATELET
BASOS ABS: 59 {cells}/uL (ref 0–200)
Basophils Relative: 0.9 %
Eosinophils Absolute: 337 cells/uL (ref 15–500)
Eosinophils Relative: 5.1 %
HEMATOCRIT: 45.3 % (ref 38.5–50.0)
Hemoglobin: 15.7 g/dL (ref 13.2–17.1)
LYMPHS ABS: 2317 {cells}/uL (ref 850–3900)
MCH: 31.4 pg (ref 27.0–33.0)
MCHC: 34.7 g/dL (ref 32.0–36.0)
MCV: 90.6 fL (ref 80.0–100.0)
MPV: 10.4 fL (ref 7.5–12.5)
Monocytes Relative: 11.3 %
NEUTROS PCT: 47.6 %
Neutro Abs: 3142 cells/uL (ref 1500–7800)
Platelets: 293 10*3/uL (ref 140–400)
RBC: 5 10*6/uL (ref 4.20–5.80)
RDW: 12.9 % (ref 11.0–15.0)
Total Lymphocyte: 35.1 %
WBC: 6.6 10*3/uL (ref 3.8–10.8)
WBCMIX: 746 {cells}/uL (ref 200–950)

## 2017-12-01 LAB — BASIC METABOLIC PANEL WITH GFR
BUN: 14 mg/dL (ref 7–25)
CO2: 27 mmol/L (ref 20–32)
CREATININE: 1.04 mg/dL (ref 0.70–1.25)
Calcium: 9.7 mg/dL (ref 8.6–10.3)
Chloride: 104 mmol/L (ref 98–110)
GFR, EST NON AFRICAN AMERICAN: 75 mL/min/{1.73_m2} (ref >=60)
GFR, Est African American: 87 mL/min/{1.73_m2} (ref >=60)
GLUCOSE: 97 mg/dL (ref 65–99)
Potassium: 4.2 mmol/L (ref 3.5–5.3)
SODIUM: 139 mmol/L (ref 135–146)

## 2017-12-01 LAB — HEPATIC FUNCTION PANEL
AG RATIO: 1.7 (calc) (ref 1.0–2.5)
ALKALINE PHOSPHATASE (APISO): 57 U/L (ref 40–115)
ALT: 32 U/L (ref 9–46)
AST: 25 U/L (ref 10–35)
Albumin: 4.2 g/dL (ref 3.6–5.1)
BILIRUBIN DIRECT: 0.2 mg/dL (ref 0.0–0.2)
BILIRUBIN INDIRECT: 0.7 mg/dL (ref 0.2–1.2)
BILIRUBIN TOTAL: 0.9 mg/dL (ref 0.2–1.2)
Globulin: 2.5 g/dL (calc) (ref 1.9–3.7)
Total Protein: 6.7 g/dL (ref 6.1–8.1)

## 2017-12-01 LAB — TSH: TSH: 2.13 mIU/L (ref 0.40–4.50)

## 2017-12-01 LAB — LIPID PANEL
CHOL/HDL RATIO: 2.5 (calc) (ref <5.0)
Cholesterol: 148 mg/dL (ref <200)
HDL: 59 mg/dL (ref >40)
LDL Cholesterol (Calc): 72 mg/dL (calc)
NON-HDL CHOLESTEROL (CALC): 89 mg/dL (ref <130)
Triglycerides: 86 mg/dL (ref <150)

## 2017-12-01 LAB — MAGNESIUM: Magnesium: 2.1 mg/dL (ref 1.5–2.5)

## 2017-12-01 NOTE — Patient Instructions (Signed)
Being dehydrated can hurt your kidneys, cause fatigue, headaches, muscle aches, joint pain, and dry skin/nails so please increase your fluids.   Drink 80-100 oz a day of water, measure it out!  At least 64 oz  Eat 3 meals a day, have to do breakfast, eat protein- hard boiled eggs, protein bar like nature valley protein bar, greek yogurt like oikos triple zero, chobani 100, or light n fit greek  Can check out plantnanny app on your phone to help you keep track of your water

## 2017-12-22 ENCOUNTER — Other Ambulatory Visit: Payer: Self-pay | Admitting: Physician Assistant

## 2018-01-31 DIAGNOSIS — L814 Other melanin hyperpigmentation: Secondary | ICD-10-CM | POA: Diagnosis not present

## 2018-01-31 DIAGNOSIS — L719 Rosacea, unspecified: Secondary | ICD-10-CM | POA: Diagnosis not present

## 2018-01-31 DIAGNOSIS — D225 Melanocytic nevi of trunk: Secondary | ICD-10-CM | POA: Diagnosis not present

## 2018-01-31 DIAGNOSIS — Z23 Encounter for immunization: Secondary | ICD-10-CM | POA: Diagnosis not present

## 2018-01-31 DIAGNOSIS — Z86018 Personal history of other benign neoplasm: Secondary | ICD-10-CM | POA: Diagnosis not present

## 2018-01-31 DIAGNOSIS — L2084 Intrinsic (allergic) eczema: Secondary | ICD-10-CM | POA: Diagnosis not present

## 2018-01-31 DIAGNOSIS — L821 Other seborrheic keratosis: Secondary | ICD-10-CM | POA: Diagnosis not present

## 2018-01-31 DIAGNOSIS — Z85820 Personal history of malignant melanoma of skin: Secondary | ICD-10-CM | POA: Diagnosis not present

## 2018-01-31 DIAGNOSIS — D1800 Hemangioma unspecified site: Secondary | ICD-10-CM | POA: Diagnosis not present

## 2018-01-31 DIAGNOSIS — Z85828 Personal history of other malignant neoplasm of skin: Secondary | ICD-10-CM | POA: Diagnosis not present

## 2018-02-16 DIAGNOSIS — H903 Sensorineural hearing loss, bilateral: Secondary | ICD-10-CM | POA: Diagnosis not present

## 2018-07-24 DIAGNOSIS — H2513 Age-related nuclear cataract, bilateral: Secondary | ICD-10-CM | POA: Diagnosis not present

## 2018-07-26 ENCOUNTER — Ambulatory Visit (INDEPENDENT_AMBULATORY_CARE_PROVIDER_SITE_OTHER): Payer: Medicare Other | Admitting: Internal Medicine

## 2018-07-26 ENCOUNTER — Encounter: Payer: Self-pay | Admitting: Internal Medicine

## 2018-07-26 ENCOUNTER — Encounter: Payer: Self-pay | Admitting: Physician Assistant

## 2018-07-26 VITALS — BP 140/84 | HR 56 | Temp 97.9°F | Resp 16 | Ht 68.5 in | Wt 183.6 lb

## 2018-07-26 DIAGNOSIS — Z79899 Other long term (current) drug therapy: Secondary | ICD-10-CM | POA: Diagnosis not present

## 2018-07-26 DIAGNOSIS — N401 Enlarged prostate with lower urinary tract symptoms: Secondary | ICD-10-CM | POA: Diagnosis not present

## 2018-07-26 DIAGNOSIS — Z23 Encounter for immunization: Secondary | ICD-10-CM | POA: Diagnosis not present

## 2018-07-26 DIAGNOSIS — R7309 Other abnormal glucose: Secondary | ICD-10-CM

## 2018-07-26 DIAGNOSIS — E559 Vitamin D deficiency, unspecified: Secondary | ICD-10-CM

## 2018-07-26 DIAGNOSIS — N138 Other obstructive and reflux uropathy: Secondary | ICD-10-CM

## 2018-07-26 DIAGNOSIS — I700 Atherosclerosis of aorta: Secondary | ICD-10-CM

## 2018-07-26 DIAGNOSIS — E782 Mixed hyperlipidemia: Secondary | ICD-10-CM | POA: Diagnosis not present

## 2018-07-26 DIAGNOSIS — Z1212 Encounter for screening for malignant neoplasm of rectum: Secondary | ICD-10-CM

## 2018-07-26 DIAGNOSIS — R7303 Prediabetes: Secondary | ICD-10-CM | POA: Diagnosis not present

## 2018-07-26 DIAGNOSIS — Z8249 Family history of ischemic heart disease and other diseases of the circulatory system: Secondary | ICD-10-CM

## 2018-07-26 DIAGNOSIS — I10 Essential (primary) hypertension: Secondary | ICD-10-CM

## 2018-07-26 DIAGNOSIS — Z136 Encounter for screening for cardiovascular disorders: Secondary | ICD-10-CM | POA: Diagnosis not present

## 2018-07-26 DIAGNOSIS — Z1211 Encounter for screening for malignant neoplasm of colon: Secondary | ICD-10-CM

## 2018-07-26 DIAGNOSIS — Z125 Encounter for screening for malignant neoplasm of prostate: Secondary | ICD-10-CM

## 2018-07-26 NOTE — Progress Notes (Signed)
[facility]   Unk Pinto, M.D.     Uvaldo Bristle. Silverio Lay, P.A.-C Liane Comber, [place]                [address] [place], [place] [address] Telephone [phone] Telefax [phone]   Comprehensive Evaluation & Examination     This very nice 67 y.o. MWM presents for a comprehensive evaluation and management of multiple medical co-morbidities.  Patient has been followed for HTN, HLD, Prediabetes and Vitamin D Deficiency. Patient also has GERD controlled with diet & his meds.      HTN predates circa 1995 . Patient's BP has been controlled at home.  Today's BP is rechecked at goal - 140/84. Patient denies any cardiac symptoms as chest pain, palpitations, shortness of breath, dizziness or ankle swelling. BP Readings from Last 3 Encounters:  07/26/18 140/84  12/01/17 120/82  10/07/17 139/83       Patient's hyperlipidemia is controlled with diet and medications. Patient denies myalgias or other medication SE's. Last lipids were at goal: Lab Results  Component Value Date   CHOL 144 07/26/2018   HDL 58 07/26/2018   LDLCALC 72 07/26/2018   TRIG 63 07/26/2018   CHOLHDL 2.5 07/26/2018      Patient has hx/o prediabetes (A1c 6.0%/2012) and patient denies reactive hypoglycemic symptoms, visual blurring, diabetic polys or paresthesias. Last A1c was Normal & at goal: Lab Results  Component Value Date   HGBA1C 5.5 07/25/2017       Finally, patient has history of Vitamin D Deficiency ("34"/2012)  and last vitamin D was sl low (goal 70-100): Lab Results  Component Value Date   VD25OH 50 07/25/2017   Current Outpatient Medications on File Prior to Visit  Medication Sig  . ALPRAZOLAM PO Take 1 tablet by mouth as needed. Uses before doctor visit due to BP elevated with white coat syndrome.  Marland Kitchen atorvastatin (LIPITOR) 20 MG tablet TAKE 1 TABLET BY MOUTH EVERY DAY  . azelastine (OPTIVAR) 0.05 %  ophthalmic solution Place 1 drop into both eyes 2 (two) times daily as needed.  . Cholecalciferol (VITAMIN D PO) Take 4,000 Int'l Units by mouth.  . Fexofenadine HCl (ALLEGRA PO) Take by mouth daily.  . fluticasone (FLONASE) 50 MCG/ACT nasal spray USE 2 SPRAYS IN EACH       NOSTRIL DAILY  . ibuprofen (ADVIL,MOTRIN) 800 MG tablet Take 800 mg by mouth every 8 (eight) hours as needed.  . montelukast (SINGULAIR) 10 MG tablet Take 1 tablet (10 mg total) by mouth at bedtime.  . ranitidine (ZANTAC) 75 MG tablet Take 75 mg by mouth as needed for heartburn.   No current facility-administered medications on file prior to visit.    Allergies  Allergen Reactions  . Hismanal [Astemizole]   . Latex   . Penicillins Hives   Past Medical History:  Diagnosis Date  . Allergy   . 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 Cataract    both eyes no surgery needed  . Chronic kidney disease    kidney stones  . GERD (gastroesophageal reflux disease)   . Hyperlipidemia   . Hypertension    white coat syndrome  . Prediabetes   . Vitamin D deficiency    Health Maintenance  Topic Date Due  . INFLUENZA VACCINE  07/20/2018  . PNA  vac Low Risk Adult (2 of 2 - PPSV23) 07/25/2018  . COLONOSCOPY  10/07/2022  . TETANUS/TDAP  07/20/2026  . Hepatitis C Screening  Completed   Immunization History  Administered Date(s) Administered  . Influenza, High Dose Seasonal PF 10/12/2017  . Influenza-Unspecified 10/23/2015  . Pneumococcal Conjugate-13 07/25/2017  . Pneumococcal Polysaccharide-23 06/10/2011, 07/26/2018  . Td 04/14/2006  . Tdap 07/20/2016  . Zoster 01/16/2013   Last Colon - 10/07/2017 - Dr Henrene Pastor - recc 5 yr f/u in Oct 2023  Past Surgical History:  Procedure Laterality Date  . BASAL CELL CARCINOMA EXCISION    . COLONOSCOPY    . ELBOW ARTHROSCOPY WITH TENDON RECONSTRUCTION Left 2013  . GUM SURGERY Left 2017   bone graft, root canal left side  . KNEE ARTHROSCOPY Right 1982,  1999  . melanoma excision     cheek lt.  . SPLENECTOMY     Family History  Problem Relation Age of Onset  . Alzheimer's disease Mother   . Heart disease Father   . Dementia Father   . Colon polyps Brother   . Colon cancer Neg Hx   . Esophageal cancer Neg Hx   . Rectal cancer Neg Hx   . Stomach cancer Neg Hx    Social History   Socioeconomic History  . Marital status: Married    Spouse name: Jana Half  . Number of children: Son 10, d 56 and s 60  Occupational History  . 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 Smoking status: Never Smoker  . Smokeless tobacco: Never Used  Substance and Sexual Activity  . Alcohol use: Yes    Alcohol/week: 3.0 oz    Types: 5 Standard drinks or equivalent per week    Comment: Wine  . Drug use: No  . Sexual activity: Active    ROS Constitutional: Denies fever, chills, weight loss/gain, headaches, insomnia,  night sweats or change in appetite. Does c/o fatigue. Eyes: Denies redness, blurred vision, diplopia, discharge, itchy or watery eyes.  ENT: Denies discharge, congestion, post nasal drip, epistaxis, sore throat, earache, hearing loss, dental pain, Tinnitus, Vertigo, Sinus pain or snoring.  Cardio: Denies chest pain, palpitations, irregular heartbeat, syncope, dyspnea, diaphoresis, orthopnea, PND, claudication or edema Respiratory: denies cough, dyspnea, DOE, pleurisy, hoarseness, laryngitis or wheezing.  Gastrointestinal: Denies dysphagia, heartburn, reflux, water brash, pain, cramps, nausea, vomiting, bloating, diarrhea, constipation, hematemesis, melena, hematochezia, jaundice or hemorrhoids Genitourinary: Denies dysuria, frequency, discharge, hematuria or flank pain. Has urgency, nocturia x 2-3 & occasional hesitancy. Musculoskeletal: Denies arthralgia, myalgia, stiffness, Jt. Swelling, pain, limp or strain/sprain. Denies Falls. Skin: Denies puritis, rash, hives, warts, acne, eczema or change in skin  lesion Neuro: No weakness, tremor, incoordination, spasms, paresthesia or pain Psychiatric: Denies confusion, memory loss or sensory loss. Denies Depression. Endocrine: Denies change in weight, skin, hair change, nocturia, and paresthesia, diabetic polys, visual blurring or hyper / hypo glycemic episodes.  Heme/Lymph: No excessive bleeding, bruising or enlarged lymph nodes.  Physical Exam  BP 140/84   Pulse (!) 56   Temp 97.9 F (36.6 C)   Resp 16   Ht 5' 8.5" (1.74 m)   Wt 183 lb 9.6 oz (83.3 kg)   BMI 27.51 kg/m   General Appearance: Well nourished and well groomed and in no apparent distress.  Eyes: PERRLA, EOMs, conjunctiva no swelling or erythema, normal fundi and vessels. Sinuses: No frontal/maxillary tenderness ENT/Mouth: EACs patent / TMs  nl. Nares clear without erythema, swelling,  mucoid exudates. Oral hygiene is good. No erythema, swelling, or exudate. Tongue normal, non-obstructing. Tonsils not swollen or erythematous. Hearing normal.  Neck: Supple, thyroid not palpable. No bruits, nodes or JVD. Respiratory: Respiratory effort normal.  BS equal and clear bilateral without rales, rhonci, wheezing or stridor. Cardio: Heart sounds are normal with regular rate and rhythm and no murmurs, rubs or gallops. Peripheral pulses are normal and equal bilaterally without edema. No aortic or femoral bruits. Chest: symmetric with normal excursions and percussion.  Abdomen: Soft, with Nl bowel sounds. Nontender, no guarding, rebound, hernias, masses, or organomegaly.  Lymphatics: Non tender without lymphadenopathy.  Genitourinary: DRE - deferred by patient request to recent Colonoscopy Musculoskeletal: Full ROM all peripheral extremities, joint stability, 5/5 strength, and normal gait. Skin: Warm and dry without rashes, lesions, cyanosis, clubbing or  ecchymosis.  Neuro: Cranial nerves intact, reflexes equal bilaterally. Normal muscle tone, no cerebellar symptoms. Sensation intact.   Pysch: Alert and oriented X 3 with normal affect, insight and judgment appropriate.   Assessment and Plan  1. Essential hypertension  - EKG 12-Lead - [place], RETROPERITNL ABD,  LTD - Urinalysis, Routine w reflex microscopic - Microalbumin / creatinine urine ratio - CBC with Differential/Platelet - COMPLETE METABOLIC PANEL WITH GFR - Magnesium - TSH  2. Hyperlipidemia, mixed  - EKG 12-Lead - [place], RETROPERITNL ABD,  LTD - Lipid panel - TSH  3. Abnormal glucose  - EKG 12-Lead - [place], RETROPERITNL ABD,  LTD - Hemoglobin A1c - Insulin, random  4. Vitamin D deficiency  - VITAMIN D 25 Hydroxyl  5. Prediabetes  - EKG 12-Lead - [place], RETROPERITNL ABD,  LTD - Hemoglobin A1c - Insulin, random  6. Screening for colorectal cancer  - POC Hemoccult Bld/Stl   7. BPH with obstruction/lower urinary tract symptoms  - PSA  8. Prostate cancer screening  - PSA  9. Screening for ischemic heart disease  - EKG 12-Lead  10. FHx: heart disease  - EKG 12-Lead - [place], RETROPERITNL ABD,  LTD  11. Aortic atherosclerosis (HCC)  - [place], RETROPERITNL ABD,  LTD  12. Screening for AAA (aortic abdominal aneurysm)  - [place], RETROPERITNL ABD,  LTD  13. Medication management  - Urinalysis, Routine w reflex microscopic - Microalbumin / creatinine urine ratio - CBC with Differential/Platelet - COMPLETE METABOLIC PANEL WITH GFR - Magnesium - Lipid panel - TSH - Hemoglobin A1c - Insulin, random - VITAMIN D 25 Hydroxyl  14. Need for prophylactic vaccination against Streptococcus pneumoniae (pneumococcus)  - Pneumococcal polysaccharide vaccine 23-valent greater than or equal to 2yo subcutaneous/IM      Patient was counseled in prudent diet, weight control to achieve/maintain BMI less than 25, BP monitoring, regular exercise and medications as discussed.  Discussed med effects and SE's. Routine screening labs and tests as requested with regular follow-up as recommended. Over 40 minutes of  exam, counseling, chart review and high complex critical decision making was performed

## 2018-07-26 NOTE — Patient Instructions (Addendum)

## 2018-07-27 LAB — URINALYSIS, ROUTINE W REFLEX MICROSCOPIC
BILIRUBIN URINE: NEGATIVE
Bacteria, UA: NONE SEEN /HPF
Glucose, UA: NEGATIVE
HGB URINE DIPSTICK: NEGATIVE
Hyaline Cast: NONE SEEN /LPF
KETONES UR: NEGATIVE
NITRITE: NEGATIVE
PROTEIN: NEGATIVE
RBC / HPF: NONE SEEN /HPF (ref 0–2)
SPECIFIC GRAVITY, URINE: 1.019 (ref 1.001–1.03)
SQUAMOUS EPITHELIAL / LPF: NONE SEEN /HPF (ref <=5)
pH: 5.5 (ref 5.0–8.0)

## 2018-07-27 LAB — CBC WITH DIFFERENTIAL/PLATELET
BASOS ABS: 70 {cells}/uL (ref 0–200)
Basophils Relative: 1.2 %
EOS PCT: 6.3 %
Eosinophils Absolute: 365 cells/uL (ref 15–500)
HCT: 45.8 % (ref 38.5–50.0)
Hemoglobin: 15.9 g/dL (ref 13.2–17.1)
LYMPHS ABS: 2250 {cells}/uL (ref 850–3900)
MCH: 31.5 pg (ref 27.0–33.0)
MCHC: 34.7 g/dL (ref 32.0–36.0)
MCV: 90.9 fL (ref 80.0–100.0)
MONOS PCT: 9.6 %
MPV: 10.7 fL (ref 7.5–12.5)
NEUTROS PCT: 44.1 %
Neutro Abs: 2558 cells/uL (ref 1500–7800)
PLATELETS: 281 10*3/uL (ref 140–400)
RBC: 5.04 10*6/uL (ref 4.20–5.80)
RDW: 12.6 % (ref 11.0–15.0)
TOTAL LYMPHOCYTE: 38.8 %
WBC mixed population: 557 cells/uL (ref 200–950)
WBC: 5.8 10*3/uL (ref 3.8–10.8)

## 2018-07-27 LAB — COMPLETE METABOLIC PANEL WITH GFR
AG Ratio: 1.9 (calc) (ref 1.0–2.5)
ALKALINE PHOSPHATASE (APISO): 60 U/L (ref 40–115)
ALT: 31 U/L (ref 9–46)
AST: 23 U/L (ref 10–35)
Albumin: 4.6 g/dL (ref 3.6–5.1)
BUN: 17 mg/dL (ref 7–25)
CALCIUM: 10.1 mg/dL (ref 8.6–10.3)
CO2: 26 mmol/L (ref 20–32)
CREATININE: 1.15 mg/dL (ref 0.70–1.25)
Chloride: 106 mmol/L (ref 98–110)
GFR, EST NON AFRICAN AMERICAN: 66 mL/min/{1.73_m2} (ref >=60)
GFR, Est African American: 76 mL/min/{1.73_m2} (ref >=60)
GLOBULIN: 2.4 g/dL (ref 1.9–3.7)
GLUCOSE: 101 mg/dL — AB (ref 65–99)
Potassium: 4.9 mmol/L (ref 3.5–5.3)
SODIUM: 141 mmol/L (ref 135–146)
Total Bilirubin: 0.7 mg/dL (ref 0.2–1.2)
Total Protein: 7 g/dL (ref 6.1–8.1)

## 2018-07-27 LAB — LIPID PANEL
CHOL/HDL RATIO: 2.5 (calc) (ref <5.0)
CHOLESTEROL: 144 mg/dL (ref <200)
HDL: 58 mg/dL (ref >40)
LDL CHOLESTEROL (CALC): 72 mg/dL
Non-HDL Cholesterol (Calc): 86 mg/dL (calc) (ref <130)
Triglycerides: 63 mg/dL (ref <150)

## 2018-07-27 LAB — MICROALBUMIN / CREATININE URINE RATIO
Creatinine, Urine: 109 mg/dL (ref 20–320)
Microalb Creat Ratio: 7 mcg/mg creat (ref <30)
Microalb, Ur: 0.8 mg/dL

## 2018-07-27 LAB — HEMOGLOBIN A1C
HEMOGLOBIN A1C: 5.5 %{Hb} (ref <5.7)
MEAN PLASMA GLUCOSE: 111 (calc)
eAG (mmol/L): 6.2 (calc)

## 2018-07-27 LAB — TSH: TSH: 2.04 mIU/L (ref 0.40–4.50)

## 2018-07-27 LAB — INSULIN, RANDOM: Insulin: 5.4 u[IU]/mL (ref 2.0–19.6)

## 2018-07-27 LAB — PSA: PSA: 0.7 ng/mL (ref <=4.0)

## 2018-07-27 LAB — VITAMIN D 25 HYDROXY (VIT D DEFICIENCY, FRACTURES): Vit D, 25-Hydroxy: 59 ng/mL (ref 30–100)

## 2018-07-27 LAB — MAGNESIUM: MAGNESIUM: 2.2 mg/dL (ref 1.5–2.5)

## 2018-09-05 ENCOUNTER — Other Ambulatory Visit: Payer: Self-pay

## 2018-09-05 DIAGNOSIS — Z1212 Encounter for screening for malignant neoplasm of rectum: Principal | ICD-10-CM

## 2018-09-05 DIAGNOSIS — Z1211 Encounter for screening for malignant neoplasm of colon: Secondary | ICD-10-CM

## 2018-09-05 LAB — POC HEMOCCULT BLD/STL (HOME/3-CARD/SCREEN)
Card #3 Fecal Occult Blood, POC: NEGATIVE
FECAL OCCULT BLD: NEGATIVE
FECAL OCCULT BLD: POSITIVE

## 2018-09-18 ENCOUNTER — Other Ambulatory Visit: Payer: Self-pay

## 2018-09-18 MED ORDER — ATORVASTATIN CALCIUM 20 MG PO TABS: 20.0000 mg | ORAL_TABLET | Freq: Every day | ORAL | 0 refills | Status: DC

## 2018-10-13 DIAGNOSIS — Z23 Encounter for immunization: Secondary | ICD-10-CM | POA: Diagnosis not present

## 2018-11-27 ENCOUNTER — Ambulatory Visit (INDEPENDENT_AMBULATORY_CARE_PROVIDER_SITE_OTHER): Payer: Medicare Other | Admitting: Adult Health

## 2018-11-27 ENCOUNTER — Encounter: Payer: Self-pay | Admitting: Adult Health

## 2018-11-27 VITALS — BP 128/74 | HR 71 | Temp 97.7°F | Ht 68.5 in | Wt 189.0 lb

## 2018-11-27 DIAGNOSIS — J0100 Acute maxillary sinusitis, unspecified: Secondary | ICD-10-CM | POA: Diagnosis not present

## 2018-11-27 MED ORDER — PROMETHAZINE-DM 6.25-15 MG/5ML PO SYRP: 5.0000 mL | ORAL_SOLUTION | Freq: Four times a day (QID) | ORAL | 1 refills | Status: DC | PRN

## 2018-11-27 MED ORDER — AZITHROMYCIN 250 MG PO TABS: ORAL_TABLET | ORAL | 1 refills | Status: AC

## 2018-11-27 MED ORDER — PREDNISONE 20 MG PO TABS: ORAL_TABLET | ORAL | 0 refills | Status: DC

## 2018-11-27 NOTE — Patient Instructions (Signed)

## 2018-11-27 NOTE — Progress Notes (Signed)
Assessment and Plan:  Barry Flynn was seen today for laryngitis, sore throat and sinusitis.  Diagnoses and all orders for this visit:  Acute non-recurrent maxillary sinusitis Suggested symptomatic OTC remedies, voice rest, salt water gargles Nasal saline spray for congestion Nasal steroids, allergy pill, oral steroids offered Follow up as needed. -     predniSONE (DELTASONE) 20 MG tablet; 2 tablets daily for 3 days, 1 tablet daily for 4 days. -     azithromycin (ZITHROMAX) 250 MG tablet; Take 2 tablets (500 mg) on  Day 1,  followed by 1 tablet (250 mg) once daily on Days 2 through 5. -     promethazine-dextromethorphan (PROMETHAZINE-DM) 6.25-15 MG/5ML syrup; Take 5 mLs by mouth 4 (four) times daily as needed for cough.  Further disposition pending results of labs. Discussed med's effects and SE's.   Over 15 minutes of exam, counseling, chart review, and critical decision making was performed.   Future Appointments  Date Time Provider [place]  12/01/2018  9:00 AM Vicie Mutters, PA-C [facility] None  03/06/2019  9:30 AM Unk Pinto, MD [facility] None  08/16/2019  9:00 AM Unk Pinto, MD [facility] None    ------------------------------------------------------------------------------------------------------------------   HPI BP 128/74   Pulse 71   Temp 97.7 F (36.5 C)   Ht 5' 8.5" (1.74 m)   Wt 189 lb (85.7 kg)   SpO2 97%   BMI 28.32 kg/m   67 y.o.male hx of seasonal allergies, asthma (not on inhalers recently well controlled), splenectomy presents for 6 days of URI symptoms; began as scratchy throat, then progressed to have some throat, sinus pressure (worse on R), bilateral ear pressure, hoarseness, non-productive cough, nasal secretions are green. He hasn't felt he had fever/chills, does feel some heavy sensation of R lower chest, denies dyspnea, wheezing, palpitations, GI symptoms, headache. He does endorse upper R sided teeth achiness.   He does endorse  seasonal allergies, takes allegra, singulair, flonase, eye drops. He did have the influenza vaccine this season. No known sick contacts, recent travel. No smoking hx.   Past Medical History:  Diagnosis Date  . Allergy   . 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 Cataract    both eyes no surgery needed  . Chronic kidney disease    kidney stones  . GERD (gastroesophageal reflux disease)   . Hyperlipidemia   . Hypertension    white coat syndrome  . Prediabetes   . Vitamin D deficiency      Allergies  Allergen Reactions  . Hismanal [Astemizole]   . Latex   . Penicillins Hives    Current Outpatient Medications on File Prior to Visit  Medication Sig  . ALPRAZOLAM PO Take 1 tablet by mouth as needed. Uses before doctor visit due to BP elevated with white coat syndrome.  Marland Kitchen atorvastatin (LIPITOR) 20 MG tablet Take 1 tablet (20 mg total) by mouth daily.  Marland Kitchen azelastine (OPTIVAR) 0.05 % ophthalmic solution Place 1 drop into both eyes 2 (two) times daily as needed.  . Cholecalciferol (VITAMIN D PO) Take 4,000 Int'l Units by mouth.  . Fexofenadine HCl (ALLEGRA PO) Take by mouth daily.  . fluticasone (FLONASE) 50 MCG/ACT nasal spray USE 2 SPRAYS IN EACH       NOSTRIL DAILY  . ibuprofen (ADVIL,MOTRIN) 800 MG tablet Take 800 mg by mouth every 8 (eight) hours as needed.  . montelukast (SINGULAIR) 10 MG tablet Take 1 tablet (10 mg total) by mouth at bedtime.  Marland Kitchen  ranitidine (ZANTAC) 75 MG tablet Take 75 mg by mouth as needed for heartburn.   No current facility-administered medications on file prior to visit.     ROS: all negative except above.   Physical Exam:  BP 128/74   Pulse 71   Temp 97.7 F (36.5 C)   Ht 5' 8.5" (1.74 m)   Wt 189 lb (85.7 kg)   SpO2 97%   BMI 28.32 kg/m   General Appearance: Well nourished, in no apparent distress. Eyes: PERRLA, conjunctiva no swelling or erythema Sinuses: No Frontal tenderness, he does have R maxillary  tenderness ENT/Mouth: Ext aud canals clear, TMs without erythema, bulging. No erythema, swelling, or exudate on post pharynx.  Tonsils not swollen or erythematous. Hearing normal.  Neck: Supple Respiratory: Respiratory effort normal, BS equal bilaterally without rales, rhonchi, wheezing or stridor.  Cardio: RRR with no MRGs. Brisk peripheral pulses without edema.  Abdomen: Soft, + BS.  Non tender. Lymphatics: Non tender without lymphadenopathy.  Musculoskeletal: Symmetrical strength, normal gait.  Skin: Warm, dry without rashes, lesions, ecchymosis.  Neuro: Cranial nerves intact. Normal muscle tone, no cerebellar symptoms.  Psych: Awake and oriented X 3, normal affect, Insight and Judgment appropriate.     Izora Ribas, NP 2:23 PM [facility]

## 2018-12-01 ENCOUNTER — Ambulatory Visit (INDEPENDENT_AMBULATORY_CARE_PROVIDER_SITE_OTHER): Payer: Medicare Other | Admitting: Physician Assistant

## 2018-12-01 ENCOUNTER — Encounter: Payer: Self-pay | Admitting: Physician Assistant

## 2018-12-01 VITALS — BP 120/70 | HR 54 | Temp 97.7°F | Ht 68.0 in | Wt 189.4 lb

## 2018-12-01 DIAGNOSIS — H9193 Unspecified hearing loss, bilateral: Secondary | ICD-10-CM

## 2018-12-01 DIAGNOSIS — I10 Essential (primary) hypertension: Secondary | ICD-10-CM | POA: Diagnosis not present

## 2018-12-01 DIAGNOSIS — Z0000 Encounter for general adult medical examination without abnormal findings: Secondary | ICD-10-CM

## 2018-12-01 DIAGNOSIS — E559 Vitamin D deficiency, unspecified: Secondary | ICD-10-CM | POA: Diagnosis not present

## 2018-12-01 DIAGNOSIS — R001 Bradycardia, unspecified: Secondary | ICD-10-CM

## 2018-12-01 DIAGNOSIS — R6889 Other general symptoms and signs: Secondary | ICD-10-CM

## 2018-12-01 DIAGNOSIS — F418 Other specified anxiety disorders: Secondary | ICD-10-CM | POA: Diagnosis not present

## 2018-12-01 DIAGNOSIS — Z0001 Encounter for general adult medical examination with abnormal findings: Secondary | ICD-10-CM

## 2018-12-01 DIAGNOSIS — J45909 Unspecified asthma, uncomplicated: Secondary | ICD-10-CM

## 2018-12-01 DIAGNOSIS — Z79899 Other long term (current) drug therapy: Secondary | ICD-10-CM

## 2018-12-01 DIAGNOSIS — Z85820 Personal history of malignant melanoma of skin: Secondary | ICD-10-CM | POA: Diagnosis not present

## 2018-12-01 DIAGNOSIS — R7303 Prediabetes: Secondary | ICD-10-CM | POA: Diagnosis not present

## 2018-12-01 DIAGNOSIS — E785 Hyperlipidemia, unspecified: Secondary | ICD-10-CM | POA: Diagnosis not present

## 2018-12-01 MED ORDER — LISINOPRIL 10 MG PO TABS: 10.0000 mg | ORAL_TABLET | Freq: Every day | ORAL | 3 refills | Status: DC
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
## 2018-12-01 NOTE — Patient Instructions (Signed)
HYPERTENSION INFORMATION  Monitor your blood pressure at home, please keep a record and bring that in with you to your next office visit.   Go to the ER if any CP, SOB, nausea, dizziness, severe HA, changes vision/speech  Due to a recent study, SPRINT, we have changed our goal for the systolic or top blood pressure number. Ideally we want your top number at 120.  In the [facility] Trial, 5000 people were randomized to a goal BP of 120 and 5000 people were randomized to a goal BP of less than 140. The patients with the goal BP at 120 had LESS DEMENTIA, LESS HEART ATTACKS, AND LESS STROKES, AS WELL AS OVERALL DECREASED MORTALITY OR DEATH RATE.   If you are willing, our goal BP is the top number of 120.  Your most recent BP: BP: 120/70   Take your medications faithfully as instructed. Maintain a healthy weight. Get at least 150 minutes of aerobic exercise per week. Minimize salt intake. Minimize alcohol intake  DASH Eating Plan DASH stands for "Dietary Approaches to Stop Hypertension." The DASH eating plan is a healthy eating plan that has been shown to reduce high blood pressure (hypertension). Additional health benefits may include reducing the risk of type 2 diabetes mellitus, heart disease, and stroke. The DASH eating plan may also help with weight loss. WHAT DO I NEED TO KNOW ABOUT THE DASH EATING PLAN? For the DASH eating plan, you will follow these general guidelines:  Choose foods with a percent daily value for sodium of less than 5% (as listed on the food label).  Use salt-free seasonings or herbs instead of table salt or sea salt.  Check with your health care provider or pharmacist before using salt substitutes.  Eat lower-sodium products, often labeled as "lower sodium" or "no salt added."  Eat fresh foods.  Eat more vegetables, fruits, and low-fat dairy products.  Choose whole grains. Look for the word "whole" as the first word in the ingredient list.  Choose fish and  skinless chicken or [place] more often than red meat. Limit fish, poultry, and meat to 6 oz (170 g) each day.  Limit sweets, desserts, sugars, and sugary drinks.  Choose heart-healthy fats.  Limit cheese to 1 oz (28 g) per day.  Eat more home-cooked food and less restaurant, buffet, and fast food.  Limit fried foods.  Cook foods using methods other than frying.  Limit canned vegetables. If you do use them, rinse them well to decrease the sodium.  When eating at a restaurant, ask that your food be prepared with less salt, or no salt if possible. WHAT FOODS CAN I EAT? Seek help from a dietitian for individual calorie needs. Grains Whole grain or whole wheat bread. Brown rice. Whole grain or whole wheat pasta. Quinoa, bulgur, and whole grain cereals. Low-sodium cereals. Corn or whole wheat flour tortillas. Whole grain cornbread. Whole grain crackers. Low-sodium crackers. Vegetables Fresh or frozen vegetables (raw, steamed, roasted, or grilled). Low-sodium or reduced-sodium tomato and vegetable juices. Low-sodium or reduced-sodium tomato sauce and paste. Low-sodium or reduced-sodium canned vegetables.  Fruits All fresh, canned (in natural juice), or frozen fruits. Meat and Other Protein Products Ground beef (85% or leaner), grass-fed beef, or beef trimmed of fat. Skinless chicken or [place]. Ground chicken or [place]. Pork trimmed of fat. All fish and seafood. Eggs. Dried beans, peas, or lentils. Unsalted nuts and seeds. Unsalted canned beans. Dairy Low-fat dairy products, such as skim or 1% milk, 2% or reduced-fat cheeses,  low-fat ricotta or cottage cheese, or plain low-fat yogurt. Low-sodium or reduced-sodium cheeses. Fats and Oils Tub margarines without trans fats. Light or reduced-fat mayonnaise and salad dressings (reduced sodium). Avocado. Safflower, olive, or canola oils. Natural peanut or almond butter. Other Unsalted popcorn and pretzels. The items listed above may not be a  complete list of recommended foods or beverages. Contact your dietitian for more options. WHAT FOODS ARE NOT RECOMMENDED? Grains White bread. White pasta. White rice. Refined cornbread. Bagels and croissants. Crackers that contain trans fat. Vegetables Creamed or fried vegetables. Vegetables in a cheese sauce. Regular canned vegetables. Regular canned tomato sauce and paste. Regular tomato and vegetable juices. Fruits Dried fruits. Canned fruit in light or heavy syrup. Fruit juice. Meat and Other Protein Products Fatty cuts of meat. Ribs, chicken wings, bacon, sausage, bologna, salami, chitterlings, fatback, hot dogs, bratwurst, and packaged luncheon meats. Salted nuts and seeds. Canned beans with salt. Dairy Whole or 2% milk, cream, half-and-half, and cream cheese. Whole-fat or sweetened yogurt. Full-fat cheeses or blue cheese. Nondairy creamers and whipped toppings. Processed cheese, cheese spreads, or cheese curds. Condiments Onion and garlic salt, seasoned salt, table salt, and sea salt. Canned and packaged gravies. Worcestershire sauce. Tartar sauce. Barbecue sauce. Teriyaki sauce. Soy sauce, including reduced sodium. Steak sauce. Fish sauce. Oyster sauce. Cocktail sauce. Horseradish. Ketchup and mustard. Meat flavorings and tenderizers. Bouillon cubes. Hot sauce. Tabasco sauce. Marinades. Taco seasonings. Relishes. Fats and Oils Butter, stick margarine, lard, shortening, ghee, and bacon fat. Coconut, palm kernel, or palm oils. Regular salad dressings. Other Pickles and olives. Salted popcorn and pretzels. The items listed above may not be a complete list of foods and beverages to avoid. Contact your dietitian for more information. WHERE CAN I FIND MORE INFORMATION? National Heart, Lung, and Blood Institute: travelstabloid.com Document Released: 11/25/2011 Document Revised: 04/22/2014 Document Reviewed: 10/10/2013 [facility] Patient Information 2015  [place], [place]. This information is not intended to replace advice given to you by your health care provider. Make sure you discuss any questions you have with your health care provider.

## 2018-12-02 LAB — COMPLETE METABOLIC PANEL WITH GFR
AG Ratio: 1.6 (calc) (ref 1.0–2.5)
ALT: 39 U/L (ref 9–46)
AST: 23 U/L (ref 10–35)
Albumin: 4.1 g/dL (ref 3.6–5.1)
Alkaline phosphatase (APISO): 62 U/L (ref 40–115)
BUN: 18 mg/dL (ref 7–25)
CALCIUM: 10 mg/dL (ref 8.6–10.3)
CO2: 29 mmol/L (ref 20–32)
CREATININE: 1.12 mg/dL (ref 0.70–1.25)
Chloride: 104 mmol/L (ref 98–110)
GFR, EST NON AFRICAN AMERICAN: 68 mL/min/{1.73_m2} (ref >=60)
GFR, Est African American: 79 mL/min/{1.73_m2} (ref >=60)
GLUCOSE: 81 mg/dL (ref 65–99)
Globulin: 2.6 g/dL (calc) (ref 1.9–3.7)
POTASSIUM: 4.2 mmol/L (ref 3.5–5.3)
Sodium: 141 mmol/L (ref 135–146)
Total Bilirubin: 0.6 mg/dL (ref 0.2–1.2)
Total Protein: 6.7 g/dL (ref 6.1–8.1)

## 2018-12-02 LAB — CBC WITH DIFFERENTIAL/PLATELET
BASOS PCT: 0.8 %
Basophils Absolute: 90 cells/uL (ref 0–200)
EOS PCT: 2.2 %
Eosinophils Absolute: 246 cells/uL (ref 15–500)
HCT: 44.4 % (ref 38.5–50.0)
Hemoglobin: 15.5 g/dL (ref 13.2–17.1)
Lymphs Abs: 5163 cells/uL — ABNORMAL HIGH (ref 850–3900)
MCH: 31.6 pg (ref 27.0–33.0)
MCHC: 34.9 g/dL (ref 32.0–36.0)
MCV: 90.6 fL (ref 80.0–100.0)
MONOS PCT: 10.8 %
MPV: 10.4 fL (ref 7.5–12.5)
NEUTROS PCT: 40.1 %
Neutro Abs: 4491 cells/uL (ref 1500–7800)
PLATELETS: 314 10*3/uL (ref 140–400)
RBC: 4.9 10*6/uL (ref 4.20–5.80)
RDW: 12.6 % (ref 11.0–15.0)
TOTAL LYMPHOCYTE: 46.1 %
WBC mixed population: 1210 cells/uL — ABNORMAL HIGH (ref 200–950)
WBC: 11.2 10*3/uL — ABNORMAL HIGH (ref 3.8–10.8)

## 2018-12-02 LAB — LIPID PANEL
Cholesterol: 129 mg/dL (ref <200)
HDL: 59 mg/dL (ref >40)
LDL CHOLESTEROL (CALC): 50 mg/dL
Non-HDL Cholesterol (Calc): 70 mg/dL (calc) (ref <130)
Total CHOL/HDL Ratio: 2.2 (calc) (ref <5.0)
Triglycerides: 116 mg/dL (ref <150)

## 2018-12-02 LAB — HEMOGLOBIN A1C
Hgb A1c MFr Bld: 5.7 % of total Hgb — ABNORMAL HIGH (ref <5.7)
Mean Plasma Glucose: 117 (calc)
eAG (mmol/L): 6.5 (calc)

## 2018-12-02 LAB — TSH: TSH: 3.34 mIU/L (ref 0.40–4.50)

## 2018-12-02 LAB — MAGNESIUM: Magnesium: 2 mg/dL (ref 1.5–2.5)

## 2019-01-13 ENCOUNTER — Other Ambulatory Visit: Payer: Self-pay | Admitting: Physician Assistant

## 2019-01-23 ENCOUNTER — Ambulatory Visit (INDEPENDENT_AMBULATORY_CARE_PROVIDER_SITE_OTHER): Payer: PPO | Admitting: Internal Medicine

## 2019-01-23 ENCOUNTER — Encounter: Payer: Self-pay | Admitting: Internal Medicine

## 2019-01-23 VITALS — BP 139/86 | HR 89 | Temp 97.9°F | Ht 68.0 in | Wt 186.4 lb

## 2019-01-23 DIAGNOSIS — N410 Acute prostatitis: Secondary | ICD-10-CM | POA: Diagnosis not present

## 2019-01-23 DIAGNOSIS — R972 Elevated prostate specific antigen [PSA]: Secondary | ICD-10-CM | POA: Diagnosis not present

## 2019-01-23 MED ORDER — SULFAMETHOXAZOLE-TRIMETHOPRIM 800-160 MG PO TABS: ORAL_TABLET | ORAL | 1 refills | Status: DC

## 2019-01-23 NOTE — Progress Notes (Signed)
   Subjective:    Patient ID: Barry Flynn, male    DOB: [birth year], 68 y.o.   MRN: [id number]  HPI    Patient is a very nice 68 yo MWM followed with  HTN, HLD, Prediabetes,  GERD  and Vitamin D Deficiency.  He presents now with urgency, nocturia x 3-4 & occasional hesitancy.  Medication Sig  . ALPRAZOLAM PO Take 1 tablet by mouth as needed. Uses before doctor visit due to BP elevated with white coat syndrome.  Marland Kitchen atorvastatin  20 MG tablet Takes 1/2 tablet every other day)  . OPTIVAR  ophth soln Place 1 drop into both eyes 2 (two) times daily as needed.  Marland Kitchen VITAMIN D Take 4,000 Int'l Units by mouth.  Delma Freeze Take by mouth as needed.   Marland Kitchen FLONASE  nasal spray USE 2 SPRAYS IN EACH       NOSTRIL DAILY  . ibuprofen  800 MG tablet Take 800 mg by mouth every 8 (eight) hours as needed.  Marland Kitchen lisinopril 10 MG tablet Take 1 tablet (10 mg total) by mouth daily.  . montelukast 10 MG tablet Take 1 tablet (10 mg total) by mouth at bedtime. (Patient taking differently: Take 10 mg by mouth as needed. )   Allergies  Allergen Reactions  . Hismanal [Astemizole]   . Latex   . Penicillins Hives   Past Medical History:  Diagnosis Date  . Allergy   . 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 Cataract    both eyes no surgery needed  . Chronic kidney disease    kidney stones  . GERD (gastroesophageal reflux disease)   . Hyperlipidemia   . Hypertension    white coat syndrome  . Prediabetes   . Vitamin D deficiency    Review of Systems   10 point systems review negative except as above.    Objective:   Physical Exam  BP 139/86   Pulse 89   Temp 97.9 F (36.6 C)   Ht 5\' 8"  (1.727 m)   Wt 186 lb 6.4 oz (84.6 kg)   SpO2 94%   BMI 28.34 kg/m   HEENT - WNL. Neck - supple.  Chest - Clear equal BS. Cor - Nl HS. RRR w/o sig MGR. PP 1(+). No edema. MS- FROM w/o deformities.  Gait Nl. GU- No Hernia. DRE - Prostate 1+ and sl tender. No nodules . Hemoccult Negative. Neuro -   Nl w/o focal abnormalities.    Assessment & Plan:   1. Acute prostatitis  - PSA  - Urinalysis, Routine w reflex microscopic - Urine Culture  - sulfamethoxazole-trimethoprim (BACTRIM DS,SEPTRA DS) 800-160 MG tablet; Take 1 tablet 2 x /day with food for infection  Dispense: 42 tablet; Refill: 1  2. Elevated PSA  - PSA  - Discussed meds, SE' prudent diet & encouraged regular exercise

## 2019-01-23 NOTE — Patient Instructions (Addendum)
Prostatitis  Prostatitis is swelling or inflammation of the prostate gland. The prostate is a walnut-sized gland that is involved in the production of semen. It is located below a man's bladder, in front of the rectum. There are four types of prostatitis:  Chronic nonbacterial prostatitis. This is the most common type of prostatitis. It may be associated with a viral infection or autoimmune disorder.  Acute bacterial prostatitis. This is the least common type of prostatitis. It starts quickly and is usually associated with a bladder infection, high fever, and shaking chills. It can occur at any age.  Chronic bacterial prostatitis. This type usually results from acute bacterial prostatitis that happens repeatedly (is recurrent) or has not been treated properly. It can occur in men of any age but is most common among middle-aged men whose prostate has begun to get larger. The symptoms are not as severe as symptoms caused by acute bacterial prostatitis.  Prostatodynia or chronic pelvic pain syndrome (CPPS). This type is also called pelvic floor disorder. It is associated with increased muscular tone in the pelvis surrounding the prostate. What are the causes? Bacterial prostatitis is caused by infection from bacteria. Chronic nonbacterial prostatitis may be caused by:  Urinary tract infections (UTIs).  Nerve damage.  A response by the body's disease-fighting system (autoimmune response).  Chemicals in the urine. The causes of the other types of prostatitis are usually not known. What are the signs or symptoms? Symptoms of this condition vary depending upon the type of prostatitis. If you have acute bacterial prostatitis, you may experience:  Urinary symptoms, such as: ? Painful urination. ? Burning during urination. ? Frequent and sudden urges to urinate. ? Inability to start urinating. ? A weak or interrupted stream of urine.  Vomiting.  Nausea.  Fever.  Chills.  Inability to  empty the bladder completely.  Pain in the: ? Muscles or joints. ? Lower back. ? Lower abdomen. If you have any of the other types of prostatitis, you may experience:  Urinary symptoms, such as: ? Sudden urges to urinate. ? Frequent urination. ? Difficulty starting urination. ? Weak urine stream. ? Dribbling after urination.  Discharge from the urethra. The urethra is a tube that opens at the end of the penis.  Pain in the: ? Testicles. ? Penis or tip of the penis. ? Rectum. ? Area in front of the rectum and below the scrotum (perineum).  Problems with sexual function.  Painful ejaculation.  Bloody semen. How is this diagnosed? This condition may be diagnosed based on:  A physical and medical exam.  Your symptoms.  A urine test to check for bacteria.  An exam in which a health care provider uses a finger to feel the prostate (digital rectal exam).  A test of a sample of semen.  Blood tests.  Ultrasound.  Removal of prostate tissue to be examined under a microscope (biopsy).  Tests to check how your body handles urine (urodynamic tests).  A test to look inside your bladder or urethra (cystoscopy). How is this treated? Treatment for this condition depends on the type of prostatitis. Treatment may involve:  Medicines to relieve pain or inflammation.  Medicines to help relax your muscles.  Physical therapy.  Heat therapy.  Techniques to help you control certain body functions (biofeedback).  Relaxation exercises.  Antibiotic medicine, if your condition is caused by bacteria.  Warm water baths (sitz baths). Sitz baths help with relaxing your pelvic floor muscles, which helps to relieve pressure on the prostate.   pain or inflammation.  · Medicines to help relax your muscles.  · Physical therapy.  · Heat therapy.  · Techniques to help you control certain body functions (biofeedback).  · Relaxation exercises.  · Antibiotic medicine, if your condition is caused by bacteria.  · Warm water baths (sitz baths). Sitz baths help with relaxing your pelvic floor muscles, which helps to relieve pressure on the prostate.  Follow these instructions at home:    · Take over-the-counter and prescription medicines only as told by your health care provider.  · If you were prescribed an antibiotic, take it as told by your health care provider. Do not stop taking the  antibiotic even if you start to feel better.  · If physical therapy, biofeedback, or relaxation exercises were prescribed, do exercises as instructed.  · Take sitz baths as directed by your health care provider. For a sitz bath, sit in warm water that is deep enough to cover your hips and buttocks.  · Keep all follow-up visits as told by your health care provider. This is important.  Contact a health care provider if:  · Your symptoms get worse.  · You have a fever.  Get help right away if:  · You have chills.  · You feel nauseous.  · You vomit.  · You feel light-headed or feel like you are going to faint.  · You are unable to urinate.  · You have blood or blood clots in your urine.  This information is not intended to replace advice given to you by your health care provider. Make sure you discuss any questions you have with your health care provider.  Document Released: 12/03/2000 Document Revised: 02/18/2018 Document Reviewed: 08/26/2016  Elsevier Interactive Patient Education © 2019 [employer]

## 2019-01-24 LAB — URINE CULTURE
MICRO NUMBER:: 149531
Result:: NO GROWTH
SPECIMEN QUALITY:: ADEQUATE

## 2019-01-24 LAB — URINALYSIS, ROUTINE W REFLEX MICROSCOPIC
Bilirubin Urine: NEGATIVE
GLUCOSE, UA: NEGATIVE
Hgb urine dipstick: NEGATIVE
Ketones, ur: NEGATIVE
Leukocytes, UA: NEGATIVE
Nitrite: NEGATIVE
Protein, ur: NEGATIVE
Specific Gravity, Urine: 1.015 (ref 1.001–1.03)
pH: 7.5 (ref 5.0–8.0)

## 2019-01-24 LAB — PSA: PSA: 0.6 ng/mL (ref <=4.0)

## 2019-01-28 ENCOUNTER — Encounter: Payer: Self-pay | Admitting: Internal Medicine

## 2019-02-06 DIAGNOSIS — L814 Other melanin hyperpigmentation: Secondary | ICD-10-CM | POA: Diagnosis not present

## 2019-02-06 DIAGNOSIS — Z86018 Personal history of other benign neoplasm: Secondary | ICD-10-CM | POA: Diagnosis not present

## 2019-02-06 DIAGNOSIS — Z85820 Personal history of malignant melanoma of skin: Secondary | ICD-10-CM | POA: Diagnosis not present

## 2019-02-06 DIAGNOSIS — Z85828 Personal history of other malignant neoplasm of skin: Secondary | ICD-10-CM | POA: Diagnosis not present

## 2019-02-06 DIAGNOSIS — D225 Melanocytic nevi of trunk: Secondary | ICD-10-CM | POA: Diagnosis not present

## 2019-02-06 DIAGNOSIS — L309 Dermatitis, unspecified: Secondary | ICD-10-CM | POA: Diagnosis not present

## 2019-02-06 DIAGNOSIS — L719 Rosacea, unspecified: Secondary | ICD-10-CM | POA: Diagnosis not present

## 2019-02-06 DIAGNOSIS — L570 Actinic keratosis: Secondary | ICD-10-CM | POA: Diagnosis not present

## 2019-02-06 DIAGNOSIS — Z23 Encounter for immunization: Secondary | ICD-10-CM | POA: Diagnosis not present

## 2019-02-06 DIAGNOSIS — L821 Other seborrheic keratosis: Secondary | ICD-10-CM | POA: Diagnosis not present

## 2019-02-20 DIAGNOSIS — H903 Sensorineural hearing loss, bilateral: Secondary | ICD-10-CM | POA: Diagnosis not present

## 2019-03-02 DIAGNOSIS — T7840XD Allergy, unspecified, subsequent encounter: Secondary | ICD-10-CM

## 2019-03-02 MED ORDER — AZELASTINE HCL 0.05 % OP SOLN: 1.0000 [drp] | Freq: Two times a day (BID) | OPHTHALMIC | 99 refills | Status: DC | PRN

## 2019-03-06 ENCOUNTER — Encounter: Payer: Self-pay | Admitting: Internal Medicine

## 2019-03-06 ENCOUNTER — Ambulatory Visit (INDEPENDENT_AMBULATORY_CARE_PROVIDER_SITE_OTHER): Payer: PPO | Admitting: Internal Medicine

## 2019-03-06 ENCOUNTER — Other Ambulatory Visit: Payer: Self-pay

## 2019-03-06 VITALS — BP 140/76 | HR 56 | Temp 97.8°F | Resp 16 | Ht 68.0 in | Wt 187.0 lb

## 2019-03-06 DIAGNOSIS — Z79899 Other long term (current) drug therapy: Secondary | ICD-10-CM | POA: Diagnosis not present

## 2019-03-06 DIAGNOSIS — E559 Vitamin D deficiency, unspecified: Secondary | ICD-10-CM | POA: Diagnosis not present

## 2019-03-06 DIAGNOSIS — I10 Essential (primary) hypertension: Secondary | ICD-10-CM

## 2019-03-06 DIAGNOSIS — R7303 Prediabetes: Secondary | ICD-10-CM

## 2019-03-06 DIAGNOSIS — F419 Anxiety disorder, unspecified: Secondary | ICD-10-CM

## 2019-03-06 DIAGNOSIS — E782 Mixed hyperlipidemia: Secondary | ICD-10-CM | POA: Diagnosis not present

## 2019-03-06 DIAGNOSIS — K219 Gastro-esophageal reflux disease without esophagitis: Secondary | ICD-10-CM

## 2019-03-06 DIAGNOSIS — R7309 Other abnormal glucose: Secondary | ICD-10-CM | POA: Diagnosis not present

## 2019-03-06 NOTE — Progress Notes (Signed)
This very nice 68 y.o. MWM presents for 6 month follow up with HTN, HLD, Pre-Diabetes, GERD and Vitamin D Deficiency. Patient's GERD is controlled on his meds     Patient is treated for HTN (1995)  & BP has been controlled at home. Today's  . Patient has had no complaints of any cardiac type chest pain, palpitations, dyspnea / orthopnea / PND, dizziness, claudication, or dependent edema.     Hyperlipidemia is controlled with diet & Atorvastatin. Patient denies myalgias or other med SE's. Last Lipids were at goal: Lab Results  Component Value Date   CHOL 129 12/01/2018   HDL 59 12/01/2018   LDLCALC 50 12/01/2018   TRIG 116 12/01/2018   CHOLHDL 2.2 12/01/2018      Also, the patient has history of  PreDiabetes  (A1c 6.0% / 2012)  and has had no symptoms of reactive hypoglycemia, diabetic polys, paresthesias or visual blurring.  Last A1c was near goal: Lab Results  Component Value Date   HGBA1C 5.7 (H) 12/01/2018      Further, the patient also has history of Vitamin D Deficiency  ("34" / 2012)  and supplements vitamin D without any suspected side-effects. Last vitamin D was at goal:  Lab Results  Component Value Date   VD25OH 59 07/26/2018   Current Outpatient Medications on File Prior to Visit  Medication Sig  . ALPRAZOLAM PO Take 1 tablet by mouth as needed. Uses before doctor visit due to BP elevated with white coat syndrome.  Marland Kitchen atorvastatin (LIPITOR) 20 MG tablet TAKE 1 TABLET BY MOUTH EVERY DAY (Patient taking differently: Takes 1/2 tablet every other day)  . azelastine (OPTIVAR) 0.05 % ophthalmic solution Place 1 drop into both eyes 2 (two) times daily as needed.  . Cholecalciferol (VITAMIN D PO) Take 4,000 Int'l Units by mouth.  . Fexofenadine HCl (ALLEGRA PO) Take by mouth as needed.   . fluticasone (FLONASE) 50 MCG/ACT nasal spray USE 2 SPRAYS IN EACH       NOSTRIL DAILY  . ibuprofen (ADVIL,MOTRIN) 800 MG tablet Take 800 mg by mouth every 8 (eight) hours as needed.  Marland Kitchen  lisinopril (PRINIVIL,ZESTRIL) 10 MG tablet Take 1 tablet (10 mg total) by mouth daily.  . montelukast (SINGULAIR) 10 MG tablet Take 1 tablet (10 mg total) by mouth at bedtime. (Patient taking differently: Take 10 mg by mouth as needed. )  . ranitidine (ZANTAC) 75 MG tablet Take 75 mg by mouth as needed for heartburn.  . sulfamethoxazole-trimethoprim (BACTRIM DS,SEPTRA DS) 800-160 MG tablet Take 1 tablet 2 x /day with food for infection   No current facility-administered medications on file prior to visit.    Allergies  Allergen Reactions  . Hismanal [Astemizole]   . Latex   . Penicillins Hives   PMHx:   Past Medical History:  Diagnosis Date  . Allergy   . 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 Cataract    both eyes no surgery needed  . Chronic kidney disease    kidney stones  . GERD (gastroesophageal reflux disease)   . Hyperlipidemia   . Hypertension    white coat syndrome  . Prediabetes   . Vitamin D deficiency    Immunization History  Administered Date(s) Administered  . Influenza, High Dose Seasonal PF 10/12/2017  . Influenza-Unspecified 10/23/2015  . Pneumococcal Conjugate-13 07/25/2017  . Pneumococcal Polysaccharide-23 06/10/2011, 07/26/2018  . Td 04/14/2006  . Tdap 07/20/2016  .  Zoster 01/16/2013   Past Surgical History:  Procedure Laterality Date  . BASAL CELL CARCINOMA EXCISION    . COLONOSCOPY    . ELBOW ARTHROSCOPY WITH TENDON RECONSTRUCTION Left 2013  . GUM SURGERY Left 2017   bone graft, root canal left side  . KNEE ARTHROSCOPY Right 1982, 1999  . melanoma excision     cheek lt.  . SPLENECTOMY     FHx:    Reviewed / unchanged  SHx:    Reviewed / unchanged   Systems Review:  Constitutional: Denies fever, chills, wt changes, headaches, insomnia, fatigue, night sweats, change in appetite. Eyes: Denies redness, blurred vision, diplopia, discharge, itchy, watery eyes.  ENT: Denies discharge, congestion, post nasal drip,  epistaxis, sore throat, earache, hearing loss, dental pain, tinnitus, vertigo, sinus pain, snoring.  CV: Denies chest pain, palpitations, irregular heartbeat, syncope, dyspnea, diaphoresis, orthopnea, PND, claudication or edema. Respiratory: denies cough, dyspnea, DOE, pleurisy, hoarseness, laryngitis, wheezing.  Gastrointestinal: Denies dysphagia, odynophagia, heartburn, reflux, water brash, abdominal pain or cramps, nausea, vomiting, bloating, diarrhea, constipation, hematemesis, melena, hematochezia  or hemorrhoids. Genitourinary: Denies dysuria, frequency, urgency, nocturia, hesitancy, discharge, hematuria or flank pain. Musculoskeletal: Denies arthralgias, myalgias, stiffness, jt. swelling, pain, limping or strain/sprain.  Skin: Denies pruritus, rash, hives, warts, acne, eczema or change in skin lesion(s). Neuro: No weakness, tremor, incoordination, spasms, paresthesia or pain. Psychiatric: Denies confusion, memory loss or sensory loss. Endo: Denies change in weight, skin or hair change.  Heme/Lymph: No excessive bleeding, bruising or enlarged lymph nodes.  Physical Exam  There were no vitals taken for this visit.  Appears  well nourished, well groomed  and in no distress.  Eyes: PERRLA, EOMs, conjunctiva no swelling or erythema. Sinuses: No frontal/maxillary tenderness ENT/Mouth: EAC's clear, TM's nl w/o erythema, bulging. Nares clear w/o erythema, swelling, exudates. Oropharynx clear without erythema or exudates. Oral hygiene is good. Tongue normal, non obstructing. Hearing intact.  Neck: Supple. Thyroid not palpable. Car 2+/2+ without bruits, nodes or JVD. Chest: Respirations nl with BS clear & equal w/o rales, rhonchi, wheezing or stridor.  Cor: Heart sounds normal w/ regular rate and rhythm without sig. murmurs, gallops, clicks or rubs. Peripheral pulses normal and equal  without edema.  Abdomen: Soft & bowel sounds normal. Non-tender w/o guarding, rebound, hernias, masses or  organomegaly.  Lymphatics: Unremarkable.  Musculoskeletal: Full ROM all peripheral extremities, joint stability, 5/5 strength and normal gait.  Skin: Warm, dry without exposed rashes, lesions or ecchymosis apparent.  Neuro: Cranial nerves intact, reflexes equal bilaterally. Sensory-motor testing grossly intact. Tendon reflexes grossly intact.  Pysch: Alert & oriented x 3.  Insight and judgement nl & appropriate. No ideations.  Assessment and Plan:  1. Essential hypertension  - Continue medication, monitor blood pressure at home.  - Continue DASH diet.  Reminder to go to the ER if any CP,  SOB, nausea, dizziness, severe HA, changes vision/speech.  - CBC with Differential/Platelet - COMPLETE METABOLIC PANEL WITH GFR - Magnesium - TSH  2. Hyperlipidemia, mixed  - Continue diet/meds, exercise,& lifestyle modifications.  - Continue monitor periodic cholesterol/liver & renal functions   - Lipid panel - TSH  3. Abnormal glucose  - Continue diet, exercise  - Lifestyle modifications.  - Monitor appropriate labs.  - Hemoglobin A1c - Insulin, random  4. Vitamin D deficiency  - Continue supplementation.  - VITAMIN D 25 Hydroxyl  5. Prediabetes  - Hemoglobin A1c - Insulin, random  6. Medication management  - CBC with Differential/Platelet - COMPLETE METABOLIC PANEL WITH  GFR - Magnesium - Lipid panel - TSH - Hemoglobin A1c - Insulin, random - VITAMIN D 25 Hydroxyl       Discussed  regular exercise, BP monitoring, weight control to achieve/maintain BMI less than 25 and discussed med and SE's. Recommended labs to assess and monitor clinical status with further disposition pending results of labs. Over 30 minutes of exam, counseling, chart review was performed.

## 2019-03-06 NOTE — Patient Instructions (Signed)

## 2019-03-07 LAB — VITAMIN D 25 HYDROXY (VIT D DEFICIENCY, FRACTURES): Vit D, 25-Hydroxy: 55 ng/mL (ref 30–100)

## 2019-03-07 LAB — CBC WITH DIFFERENTIAL/PLATELET
Absolute Monocytes: 628 cells/uL (ref 200–950)
Basophils Absolute: 62 cells/uL (ref 0–200)
Basophils Relative: 0.9 %
Eosinophils Absolute: 269 cells/uL (ref 15–500)
Eosinophils Relative: 3.9 %
HCT: 44.8 % (ref 38.5–50.0)
Hemoglobin: 15.9 g/dL (ref 13.2–17.1)
Lymphs Abs: 1898 cells/uL (ref 850–3900)
MCH: 32.3 pg (ref 27.0–33.0)
MCHC: 35.5 g/dL (ref 32.0–36.0)
MCV: 91.1 fL (ref 80.0–100.0)
MPV: 10.1 fL (ref 7.5–12.5)
Monocytes Relative: 9.1 %
Neutro Abs: 4043 cells/uL (ref 1500–7800)
Neutrophils Relative %: 58.6 %
PLATELETS: 313 10*3/uL (ref 140–400)
RBC: 4.92 10*6/uL (ref 4.20–5.80)
RDW: 12.9 % (ref 11.0–15.0)
Total Lymphocyte: 27.5 %
WBC: 6.9 10*3/uL (ref 3.8–10.8)

## 2019-03-07 LAB — COMPLETE METABOLIC PANEL WITH GFR
AG Ratio: 2 (calc) (ref 1.0–2.5)
ALT: 28 U/L (ref 9–46)
AST: 22 U/L (ref 10–35)
Albumin: 4.5 g/dL (ref 3.6–5.1)
Alkaline phosphatase (APISO): 62 U/L (ref 35–144)
BILIRUBIN TOTAL: 0.7 mg/dL (ref 0.2–1.2)
BUN: 20 mg/dL (ref 7–25)
CO2: 28 mmol/L (ref 20–32)
Calcium: 10.4 mg/dL — ABNORMAL HIGH (ref 8.6–10.3)
Chloride: 103 mmol/L (ref 98–110)
Creat: 1.06 mg/dL (ref 0.70–1.25)
GFR, EST AFRICAN AMERICAN: 84 mL/min/{1.73_m2} (ref >=60)
GFR, Est Non African American: 72 mL/min/{1.73_m2} (ref >=60)
Globulin: 2.3 g/dL (calc) (ref 1.9–3.7)
Glucose, Bld: 110 mg/dL — ABNORMAL HIGH (ref 65–99)
Potassium: 4 mmol/L (ref 3.5–5.3)
Sodium: 139 mmol/L (ref 135–146)
TOTAL PROTEIN: 6.8 g/dL (ref 6.1–8.1)

## 2019-03-07 LAB — LIPID PANEL
Cholesterol: 149 mg/dL (ref <200)
HDL: 54 mg/dL (ref >=40)
LDL Cholesterol (Calc): 72 mg/dL (calc)
Non-HDL Cholesterol (Calc): 95 mg/dL (calc) (ref <130)
TRIGLYCERIDES: 145 mg/dL (ref <150)
Total CHOL/HDL Ratio: 2.8 (calc) (ref <5.0)

## 2019-03-07 LAB — MAGNESIUM: Magnesium: 2.1 mg/dL (ref 1.5–2.5)

## 2019-03-07 LAB — HEMOGLOBIN A1C
Hgb A1c MFr Bld: 5.7 % of total Hgb — ABNORMAL HIGH (ref <5.7)
Mean Plasma Glucose: 117 (calc)
eAG (mmol/L): 6.5 (calc)

## 2019-03-07 LAB — INSULIN, RANDOM: Insulin: 68.7 u[IU]/mL — ABNORMAL HIGH

## 2019-03-07 LAB — TSH: TSH: 1.67 mIU/L (ref 0.40–4.50)

## 2019-03-07 MED ORDER — FAMOTIDINE 40 MG PO TABS: ORAL_TABLET | ORAL | 3 refills | Status: DC

## 2019-03-07 MED ORDER — ALPRAZOLAM 0.5 MG PO TABS: ORAL_TABLET | ORAL | 0 refills | Status: DC

## 2019-03-10 ENCOUNTER — Encounter: Payer: Self-pay | Admitting: Internal Medicine

## 2019-05-23 ENCOUNTER — Other Ambulatory Visit: Payer: Self-pay

## 2019-05-23 NOTE — Patient Outreach (Signed)
[name]) Care Management  05/23/2019  Barry Flynn [birth year] [id number]    Late Entry Outreach to the patient on 04/03/2019 for follow up on the HRA/HTA screening.  HIPAA verified by the patient.  The patient reported that he has no problems or issues with medications or transportation.  He does have an advanced directive in place  and declines a health coach at this time.  Discussed [facility] services and the patient agrees to six month follow up call.  Plan:  RN Health Coach will place the patient in a [place] with a six month follow up.  RN Health Coach will send a welcome letter. RN Health Coach will contact patient in the month of October and patient agrees to next outreach.   [name] RN, BSN, [place] Direct Dial:  [phone]  Fax: [phone]
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
## 2019-06-14 ENCOUNTER — Ambulatory Visit (INDEPENDENT_AMBULATORY_CARE_PROVIDER_SITE_OTHER): Payer: PPO | Admitting: Adult Health

## 2019-06-14 ENCOUNTER — Encounter: Payer: Self-pay | Admitting: Adult Health

## 2019-06-14 ENCOUNTER — Other Ambulatory Visit: Payer: Self-pay

## 2019-06-14 VITALS — BP 118/76 | HR 51 | Temp 97.2°F | Ht 68.0 in | Wt 185.2 lb

## 2019-06-14 DIAGNOSIS — F418 Other specified anxiety disorders: Secondary | ICD-10-CM | POA: Diagnosis not present

## 2019-06-14 DIAGNOSIS — J45909 Unspecified asthma, uncomplicated: Secondary | ICD-10-CM | POA: Diagnosis not present

## 2019-06-14 DIAGNOSIS — R7303 Prediabetes: Secondary | ICD-10-CM | POA: Diagnosis not present

## 2019-06-14 DIAGNOSIS — Z0001 Encounter for general adult medical examination with abnormal findings: Secondary | ICD-10-CM

## 2019-06-14 DIAGNOSIS — E785 Hyperlipidemia, unspecified: Secondary | ICD-10-CM

## 2019-06-14 DIAGNOSIS — Z6828 Body mass index (BMI) 28.0-28.9, adult: Secondary | ICD-10-CM

## 2019-06-14 DIAGNOSIS — E559 Vitamin D deficiency, unspecified: Secondary | ICD-10-CM

## 2019-06-14 DIAGNOSIS — Z85820 Personal history of malignant melanoma of skin: Secondary | ICD-10-CM

## 2019-06-14 DIAGNOSIS — T7840XD Allergy, unspecified, subsequent encounter: Secondary | ICD-10-CM

## 2019-06-14 DIAGNOSIS — I10 Essential (primary) hypertension: Secondary | ICD-10-CM | POA: Diagnosis not present

## 2019-06-14 DIAGNOSIS — Z0000 Encounter for general adult medical examination without abnormal findings: Secondary | ICD-10-CM

## 2019-06-14 DIAGNOSIS — H9193 Unspecified hearing loss, bilateral: Secondary | ICD-10-CM | POA: Diagnosis not present

## 2019-06-14 DIAGNOSIS — R001 Bradycardia, unspecified: Secondary | ICD-10-CM

## 2019-06-14 DIAGNOSIS — R6889 Other general symptoms and signs: Secondary | ICD-10-CM

## 2019-06-14 MED ORDER — PROMETHAZINE-DM 6.25-15 MG/5ML PO SYRP: 5.0000 mL | ORAL_SOLUTION | Freq: Four times a day (QID) | ORAL | 1 refills | Status: DC | PRN

## 2019-06-14 NOTE — Patient Instructions (Addendum)
Mr. Barry Flynn , Thank you for taking time to come for your Medicare Wellness Visit. I appreciate your ongoing commitment to your health goals. Please review the following plan we discussed and let me know if I can assist you in the future.   These are the goals we discussed: Goals    . DIET - INCREASE WATER INTAKE     65-80+ fluid ounces    . Weight (lb) < 175 lb (79.4 kg) (pt-stated)       This is a list of the screening recommended for you and due dates:  Health Maintenance  Topic Date Due  . Flu Shot  07/21/2019  . Colon Cancer Screening  10/07/2022  . Tetanus Vaccine  07/20/2026  .  Hepatitis C: One time screening is recommended by Center for Disease Control  (CDC) for  adults born from [birth year] through 1964.   Completed  . Pneumonia vaccines  Completed      Please bring by Living will and Advanced directives (only medically relevant portions)  General weight loss tips:   Drink 1/2 your body weight in fluid ounces of water daily; drink a tall glass of water 30 min before meals  Don't eat until you're stuffed- listen to your stomach and eat until you are 80% full   Try eating off of a salad plate; wait 10 min after finishing before going back for seconds  Start by eating the vegetables on your plate; aim for 50% of your meals to be fruits or vegetables  Then eat your protein - lean meats (grass fed if possible), fish, beans, nuts in moderation  Eat your carbs/starch last ONLY if you still are hungry. If you can, stop before finishing it all  Avoid sugar and flour - the closer it looks to it's original form in nature, typically the better it is for you  Splurge in moderation - "assign" days when you get to splurge and have the "bad stuff" - I like to follow a 80% - 20% plan- "good" choices 80 % of the time, "bad" choices in moderation 20% of the time  Simple equation is: Calories out > calories in = weight loss - even if you eat the bad stuff, if you limit portions, you will  still lose weight  Focus on lean proteins, high fiber foods, and modest intake of healthy fats     Preventing Type 2 Diabetes Mellitus Type 2 diabetes (type 2 diabetes mellitus) is a long-term (chronic) disease that affects blood sugar (glucose) levels. Normally, a hormone called insulin allows glucose to enter cells in the body. The cells use glucose for energy. In type 2 diabetes, one or both of these problems may be present:  The body does not make enough insulin.  The body does not respond properly to insulin that it makes (insulin resistance). Insulin resistance or lack of insulin causes excess glucose to build up in the blood instead of going into cells. As a result, high blood glucose (hyperglycemia) develops, which can cause many complications. Being overweight or obese and having an inactive (sedentary) lifestyle can increase your risk for diabetes. Type 2 diabetes can be delayed or prevented by making certain nutrition and lifestyle changes. What nutrition changes can be made?   Eat healthy meals and snacks regularly. Keep a healthy snack with you for when you get hungry between meals, such as fruit or a handful of nuts.  Eat lean meats and proteins that are low in saturated fats, such as chicken,  fish, egg whites, and beans. Avoid processed meats.  Eat plenty of fruits and vegetables and plenty of grains that have not been processed (whole grains). It is recommended that you eat: ? 1?2 cups of fruit every day. ? 2?3 cups of vegetables every day. ? 6?8 oz of whole grains every day, such as oats, whole wheat, bulgur, brown rice, quinoa, and millet.  Eat low-fat dairy products, such as milk, yogurt, and cheese.  Eat foods that contain healthy fats, such as nuts, avocado, olive oil, and canola oil.  Drink water throughout the day. Avoid drinks that contain added sugar, such as soda or sweet tea.  Follow instructions from your health care provider about specific eating or  drinking restrictions.  Control how much food you eat at a time (portion size). ? Check food labels to find out the serving sizes of foods. ? Use a kitchen scale to weigh amounts of foods.  Saute or steam food instead of frying it. Cook with water or broth instead of oils or butter.  Limit your intake of: ? Salt (sodium). Have no more than 1 tsp (2,400 mg) of sodium a day. If you have heart disease or high blood pressure, have less than ? tsp (1,500 mg) of sodium a day. ? Saturated fat. This is fat that is solid at room temperature, such as butter or fat on meat. What lifestyle changes can be made? Activity   Do moderate-intensity physical activity for at least 30 minutes on at least 5 days of the week, or as much as told by your health care provider.  Ask your health care provider what activities are safe for you. A mix of physical activities may be best, such as walking, swimming, cycling, and strength training.  Try to add physical activity into your day. For example: ? Park in spots that are farther away than usual, so that you walk more. For example, park in a far corner of the parking lot when you go to the office or the grocery store. ? Take a walk during your lunch break. ? Use stairs instead of elevators or escalators. Weight Loss  Lose weight as directed. Your health care provider can determine how much weight loss is best for you and can help you lose weight safely.  If you are overweight or obese, you may be instructed to lose at least 5?7 % of your body weight. Alcohol and Tobacco   Limit alcohol intake to no more than 1 drink a day for nonpregnant women and 2 drinks a day for men. One drink equals 12 oz of beer, 5 oz of wine, or 1 oz of hard liquor.  Do not use any tobacco products, such as cigarettes, chewing tobacco, and e-cigarettes. If you need help quitting, ask your health care provider. Work With [place] Provider  Have your blood glucose tested  regularly, as told by your health care provider.  Discuss your risk factors and how you can reduce your risk for diabetes.  Get screening tests as told by your health care provider. You may have screening tests regularly, especially if you have certain risk factors for type 2 diabetes.  Make an appointment with a diet and nutrition specialist (registered dietitian). A registered dietitian can help you make a healthy eating plan and can help you understand portion sizes and food labels. Why are these changes important?  It is possible to prevent or delay type 2 diabetes and related health problems by making lifestyle and  nutrition changes.  It can be difficult to recognize signs of type 2 diabetes. The best way to avoid possible damage to your body is to take actions to prevent the disease before you develop symptoms. What can happen if changes are not made?  Your blood glucose levels may keep increasing. Having high blood glucose for a long time is dangerous. Too much glucose in your blood can damage your blood vessels, heart, kidneys, nerves, and eyes.  You may develop prediabetes or type 2 diabetes. Type 2 diabetes can lead to many chronic health problems and complications, such as: ? Heart disease. ? Stroke. ? Blindness. ? Kidney disease. ? Depression. ? Poor circulation in the feet and legs, which could lead to surgical removal (amputation) in severe cases. Where to find support  Ask your health care provider to recommend a registered dietitian, diabetes educator, or weight loss program.  Look for local or online weight loss groups.  Join a gym, fitness club, or outdoor activity group, such as a walking club. Where to find more information To learn more about diabetes and diabetes prevention, visit:  American Diabetes Association (ADA): www.diabetes.[employer] of Diabetes and Digestive and Kidney Diseases: FindSpin.nl To learn  more about healthy eating, visit:  The U.S. Department of Agriculture Scientist, research (physical sciences)), Choose My Plate: http://wiley-williams.com/  Office of Disease Prevention and Health Promotion (ODPHP), Dietary Guidelines: SurferLive.at Summary  You can reduce your risk for type 2 diabetes by increasing your physical activity, eating healthy foods, and losing weight as directed.  Talk with your health care provider about your risk for type 2 diabetes. Ask about any blood tests or screening tests that you need to have. This information is not intended to replace advice given to you by your health care provider. Make sure you discuss any questions you have with your health care provider. Document Released: 03/29/2016 Document Revised: 11/17/2017 Document Reviewed: 01/27/2016 Elsevier Interactive Patient Education  2019 [employer].

## 2019-06-15 LAB — CBC WITH DIFFERENTIAL/PLATELET
Absolute Monocytes: 630 cells/uL (ref 200–950)
Basophils Absolute: 48 cells/uL (ref 0–200)
Basophils Relative: 0.8 %
Eosinophils Absolute: 282 cells/uL (ref 15–500)
Eosinophils Relative: 4.7 %
HCT: 45.1 % (ref 38.5–50.0)
Hemoglobin: 15.6 g/dL (ref 13.2–17.1)
Lymphs Abs: 2022 cells/uL (ref 850–3900)
MCH: 31.8 pg (ref 27.0–33.0)
MCHC: 34.6 g/dL (ref 32.0–36.0)
MCV: 92 fL (ref 80.0–100.0)
MPV: 10.7 fL (ref 7.5–12.5)
Monocytes Relative: 10.5 %
Neutro Abs: 3018 cells/uL (ref 1500–7800)
Neutrophils Relative %: 50.3 %
Platelets: 263 10*3/uL (ref 140–400)
RBC: 4.9 10*6/uL (ref 4.20–5.80)
RDW: 12.3 % (ref 11.0–15.0)
Total Lymphocyte: 33.7 %
WBC: 6 10*3/uL (ref 3.8–10.8)

## 2019-06-15 LAB — LIPID PANEL
Cholesterol: 136 mg/dL (ref <200)
HDL: 56 mg/dL (ref >=40)
LDL Cholesterol (Calc): 65 mg/dL (calc)
Non-HDL Cholesterol (Calc): 80 mg/dL (calc) (ref <130)
Total CHOL/HDL Ratio: 2.4 (calc) (ref <5.0)
Triglycerides: 71 mg/dL (ref <150)

## 2019-06-15 LAB — COMPLETE METABOLIC PANEL WITH GFR
AG Ratio: 1.8 (calc) (ref 1.0–2.5)
ALT: 31 U/L (ref 9–46)
AST: 21 U/L (ref 10–35)
Albumin: 4.3 g/dL (ref 3.6–5.1)
Alkaline phosphatase (APISO): 60 U/L (ref 35–144)
BUN: 21 mg/dL (ref 7–25)
CO2: 26 mmol/L (ref 20–32)
Calcium: 9.7 mg/dL (ref 8.6–10.3)
Chloride: 106 mmol/L (ref 98–110)
Creat: 1.09 mg/dL (ref 0.70–1.25)
GFR, Est African American: 81 mL/min/{1.73_m2} (ref >=60)
GFR, Est Non African American: 70 mL/min/{1.73_m2} (ref >=60)
Globulin: 2.4 g/dL (calc) (ref 1.9–3.7)
Glucose, Bld: 104 mg/dL — ABNORMAL HIGH (ref 65–99)
Potassium: 4.4 mmol/L (ref 3.5–5.3)
Sodium: 140 mmol/L (ref 135–146)
Total Bilirubin: 0.7 mg/dL (ref 0.2–1.2)
Total Protein: 6.7 g/dL (ref 6.1–8.1)

## 2019-06-15 LAB — TSH: TSH: 1.5 mIU/L (ref 0.40–4.50)

## 2019-06-15 LAB — MAGNESIUM: Magnesium: 2.2 mg/dL (ref 1.5–2.5)

## 2019-07-24 ENCOUNTER — Other Ambulatory Visit: Payer: Self-pay

## 2019-07-24 NOTE — Patient Outreach (Signed)
[facility]) Care Management  07/24/2019  Barry Flynn 1951-01-31 [id number]    Late entry  [facility] closing the program.  Patient is transitioning to external program [facility] for continued case management.  [name] RN, BSN, [place] Direct Dial:  [phone]  Fax: [phone]

## 2019-08-16 ENCOUNTER — Encounter: Payer: Self-pay | Admitting: Internal Medicine

## 2019-09-17 ENCOUNTER — Encounter: Payer: Self-pay | Admitting: Internal Medicine

## 2019-09-17 NOTE — Patient Instructions (Signed)

## 2019-09-17 NOTE — Progress Notes (Signed)
Annual  Screening/Preventative Visit  & Comprehensive Evaluation & Examination     This very nice 68 y.o. MWM presents for a Screening /Preventative Visit & comprehensive evaluation and management of multiple medical co-morbidities.  Patient has been followed for HTN, HLD, Prediabetes and Vitamin D Deficiency. Patient's GERD is controlled on his meds.     HTN predates since 1995. Patient's BP has been controlled at home.  Today's BP is at goal - 136/86. Patient denies any cardiac symptoms as chest pain, palpitations, shortness of breath, dizziness or ankle swelling.     Patient's hyperlipidemia is controlled with diet and medications. Patient denies myalgias or other medication SE's. Last lipids were at goal: Lab Results  Component Value Date   CHOL 136 06/14/2019   HDL 56 06/14/2019   LDLCALC 65 06/14/2019   TRIG 71 06/14/2019   CHOLHDL 2.4 06/14/2019      Patient has hx/o prediabetes (A1c 6.0% / 2012)  and patient denies reactive hypoglycemic symptoms, visual blurring, diabetic polys or paresthesias. Last A1c was near goal:  Lab Results  Component Value Date   HGBA1C 5.7 (H) 03/06/2019       Finally, patient has history of Vitamin D Deficiency ("34" / 2012)  and last vitamin D was near goal (70-100):   Lab Results  Component Value Date   VD25OH 55 03/06/2019   Current Outpatient Medications on File Prior to Visit  Medication Sig  . ALPRAZolam (XANAX) 0.5 MG tablet Take 1/2 to 1 tablet 3 x /day as needed for Anxiety  . atorvastatin (LIPITOR) 20 MG tablet TAKE 1 TABLET BY MOUTH EVERY DAY (Patient taking differently: Takes 1/2 tablet every other day)  . azelastine (OPTIVAR) 0.05 % ophthalmic solution Place 1 drop into both eyes 2 (two) times daily as needed.  . Cholecalciferol (VITAMIN D PO) Take 5,000 Int'l Units by mouth daily.  . famotidine (PEPCID) 40 MG tablet Take 1 tablet at bedtime for Acid Reflux  . Fexofenadine HCl (ALLEGRA PO) Take by mouth as needed.   . fluticasone  (FLONASE) 50 MCG/ACT nasal spray USE 2 SPRAYS IN EACH       NOSTRIL DAILY  . ibuprofen (ADVIL,MOTRIN) 800 MG tablet Take 800 mg by mouth every 8 (eight) hours as needed.  Marland Kitchen lisinopril (PRINIVIL,ZESTRIL) 10 MG tablet Take 1 tablet (10 mg total) by mouth daily.  Marland Kitchen zinc gluconate 50 MG tablet Take 50 mg by mouth daily.   No current facility-administered medications on file prior to visit.    Allergies  Allergen Reactions  . Hismanal [Astemizole]   . Latex   . Penicillins Hives   Past Medical History:  Diagnosis Date  . Allergy   . Cancer ([place])    skin cancer melanoma and basal cell lt. cheek  . Cataract    both eyes no surgery needed  . Chronic kidney disease    kidney stones  . GERD (gastroesophageal reflux disease)   . Hyperlipidemia   . Hypertension    white coat syndrome  . Prediabetes   . Vitamin D deficiency    Health Maintenance  Topic Date Due  . INFLUENZA VACCINE  07/21/2019  . COLONOSCOPY  10/07/2022  . TETANUS/TDAP  07/20/2026  . Hepatitis C Screening  Completed  . PNA vac Low Risk Adult  Completed   Immunization History  Administered Date(s) Administered  . Influenza, High Dose Seasonal PF 10/12/2017  . Influenza-Unspecified 10/23/2015  . Pneumococcal Conjugate-13 07/25/2017  . Pneumococcal Polysaccharide-23 06/10/2011, 07/26/2018  . Td  04/14/2006  . Tdap 07/20/2016  . Zoster 01/16/2013   Last Colon - 06/09/2007 - Dr Henrene Pastor - No Polyps - Recc 10 yr f/u - Due June 2018 & Overdue   Past Surgical History:  Procedure Laterality Date  . BASAL CELL CARCINOMA EXCISION    . COLONOSCOPY    . ELBOW ARTHROSCOPY WITH TENDON RECONSTRUCTION Left 2013  . GUM SURGERY Left 2017   bone graft, root canal left side  . KNEE ARTHROSCOPY Right 1982, 1999  . melanoma excision     cheek lt.  . SPLENECTOMY     Family History  Problem Relation Age of Onset  . Alzheimer's disease Mother   . Heart disease Father   . Dementia Father   . Colon polyps Brother   . Colon  cancer Neg Hx   . Esophageal cancer Neg Hx   . Rectal cancer Neg Hx   . Stomach cancer Neg Hx    Social History   Socioeconomic History  . Marital status: Married    Spouse name: Not on file  . Number of children: Not on file  Occupational History  . 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 Smoking status: Never Smoker  . Smokeless tobacco: Never Used  Substance and Sexual Activity  . Alcohol use: Yes    Alcohol/week: 5.0 standard drinks    Types: 5 Standard drinks or equivalent per week    Comment: Wine  . Drug use: No  . Sexual activity: Not on file    ROS Constitutional: Denies fever, chills, weight loss/gain, headaches, insomnia,  night sweats or change in appetite. Does c/o fatigue. Eyes: Denies redness, blurred vision, diplopia, discharge, itchy or watery eyes.  ENT: Denies discharge, congestion, post nasal drip, epistaxis, sore throat, earache, hearing loss, dental pain, Tinnitus, Vertigo, Sinus pain or snoring.  Cardio: Denies chest pain, palpitations, irregular heartbeat, syncope, dyspnea, diaphoresis, orthopnea, PND, claudication or edema Respiratory: denies cough, dyspnea, DOE, pleurisy, hoarseness, laryngitis or wheezing.  Gastrointestinal: Denies dysphagia, heartburn, reflux, water brash, pain, cramps, nausea, vomiting, bloating, diarrhea, constipation, hematemesis, melena, hematochezia, jaundice or hemorrhoids Genitourinary: Denies dysuria, frequency, urgency, nocturia, hesitancy, discharge, hematuria or flank pain Musculoskeletal: Denies arthralgia, myalgia, stiffness, Jt. Swelling, pain, limp or strain/sprain. Denies Falls. Skin: Denies puritis, rash, hives, warts, acne, eczema or change in skin lesion Neuro: No weakness, tremor, incoordination, spasms, paresthesia or pain Psychiatric: Denies confusion, memory loss or sensory loss. Denies Depression. Endocrine: Denies change in weight, skin, hair change, nocturia, and paresthesia,  diabetic polys, visual blurring or hyper / hypo glycemic episodes.  Heme/Lymph: No excessive bleeding, bruising or enlarged lymph nodes.  Physical Exam  BP 136/86   Pulse 64   Temp (!) 97.4 F (36.3 C)   Resp 16   Ht '5\' 9"'  (1.753 m)   Wt 186 lb (84.4 kg)   BMI 27.47 kg/m   General Appearance: Well nourished and well groomed and in no apparent distress.  Eyes: PERRLA, EOMs, conjunctiva no swelling or erythema, normal fundi and vessels. Sinuses: No frontal/maxillary tenderness ENT/Mouth: EACs patent / TMs  nl. Nares clear without erythema, swelling, mucoid exudates. Oral hygiene is good. No erythema, swelling, or exudate. Tongue normal, non-obstructing. Tonsils not swollen or erythematous. Hearing normal.  Neck: Supple, thyroid not palpable. No bruits, nodes or JVD. Respiratory: Respiratory effort normal.  BS equal and clear bilateral without rales, rhonci, wheezing or stridor. Cardio: Heart sounds are normal with regular rate and rhythm and  no murmurs, rubs or gallops. Peripheral pulses are normal and equal bilaterally without edema. No aortic or femoral bruits. Chest: symmetric with normal excursions and percussion.  Abdomen: Soft, with Nl bowel sounds. Nontender, no guarding, rebound, hernias, masses, or organomegaly.  Lymphatics: Non tender without lymphadenopathy.  Musculoskeletal: Full ROM all peripheral extremities, joint stability, 5/5 strength, and normal gait. Skin: Warm and dry without rashes, lesions, cyanosis, clubbing or  ecchymosis.  Neuro: Cranial nerves intact, reflexes equal bilaterally. Normal muscle tone, no cerebellar symptoms. Sensation intact.  Pysch: Alert and oriented X 3 with normal affect, insight and judgment appropriate.   Assessment and Plan  1. Annual Preventative/Screening Exam   2. Essential hypertension  - EKG 12-Lead - [place], RETROPERITNL ABD,  LTD - Urinalysis, Routine w reflex microscopic - Microalbumin / creatinine urine ratio - CBC with  Differential/Platelet - COMPLETE METABOLIC PANEL WITH GFR - Magnesium - TSH  3. Hyperlipidemia, mixed  - EKG 12-Lead - [place], RETROPERITNL ABD,  LTD - Lipid panel - TSH  4. Abnormal glucose  - EKG 12-Lead - [place], RETROPERITNL ABD,  LTD - Hemoglobin A1c - Insulin, random  5. Vitamin D deficiency  - VITAMIN D 25 Hydroxyl  6. Prediabetes  - EKG 12-Lead - [place], RETROPERITNL ABD,  LTD - Hemoglobin A1c - Insulin, random  7. GERD without esophagitis   8. Screening for colorectal cancer  - Ambulatory referral to Gastroenterology  9. Elevated PSA  - PSA  10. BPH with obstruction/lower urinary tract symptoms  - Microalbumin / creatinine urine ratio - PSA  11. Prostate cancer screening  - PSA  12. Screening for ischemic heart disease  - EKG 12-Lead  13. FHx: heart disease  - EKG 12-Lead - [place], RETROPERITNL ABD,  LTD  14. Aortic atherosclerosis (HCC)  - [place], RETROPERITNL ABD,  LTD  15. Screening for AAA (aortic abdominal aneurysm)  - [place], RETROPERITNL ABD,  LTD  16. Medication management  - Urinalysis, Routine w reflex microscopic - Microalbumin / creatinine urine ratio - COMPLETE METABOLIC PANEL WITH GFR - Magnesium - Lipid panel - TSH - Hemoglobin A1c - Insulin, random - VITAMIN D 25 Hydroxyl - Ambulatory referral to Gastroenterology           Patient was counseled in prudent diet, weight control to achieve/maintain BMI less than 25, BP monitoring, regular exercise and medications as discussed.  Discussed med effects and SE's. Routine screening labs and tests as requested with regular follow-up as recommended. Over 40 minutes of exam, counseling, chart review and high complex critical decision making was performed   Kirtland Bouchard, MD

## 2019-09-18 ENCOUNTER — Other Ambulatory Visit: Payer: Self-pay

## 2019-09-18 ENCOUNTER — Other Ambulatory Visit: Payer: Self-pay | Admitting: *Deleted

## 2019-09-18 ENCOUNTER — Ambulatory Visit (INDEPENDENT_AMBULATORY_CARE_PROVIDER_SITE_OTHER): Payer: PPO | Admitting: Internal Medicine

## 2019-09-18 VITALS — BP 136/86 | HR 64 | Temp 97.4°F | Resp 16 | Ht 69.0 in | Wt 186.0 lb

## 2019-09-18 DIAGNOSIS — I700 Atherosclerosis of aorta: Secondary | ICD-10-CM

## 2019-09-18 DIAGNOSIS — Z79899 Other long term (current) drug therapy: Secondary | ICD-10-CM

## 2019-09-18 DIAGNOSIS — R7303 Prediabetes: Secondary | ICD-10-CM | POA: Diagnosis not present

## 2019-09-18 DIAGNOSIS — R7309 Other abnormal glucose: Secondary | ICD-10-CM

## 2019-09-18 DIAGNOSIS — K219 Gastro-esophageal reflux disease without esophagitis: Secondary | ICD-10-CM

## 2019-09-18 DIAGNOSIS — E782 Mixed hyperlipidemia: Secondary | ICD-10-CM | POA: Diagnosis not present

## 2019-09-18 DIAGNOSIS — E559 Vitamin D deficiency, unspecified: Secondary | ICD-10-CM

## 2019-09-18 DIAGNOSIS — Z125 Encounter for screening for malignant neoplasm of prostate: Secondary | ICD-10-CM

## 2019-09-18 DIAGNOSIS — R972 Elevated prostate specific antigen [PSA]: Secondary | ICD-10-CM

## 2019-09-18 DIAGNOSIS — Z8249 Family history of ischemic heart disease and other diseases of the circulatory system: Secondary | ICD-10-CM

## 2019-09-18 DIAGNOSIS — Z1211 Encounter for screening for malignant neoplasm of colon: Secondary | ICD-10-CM

## 2019-09-18 DIAGNOSIS — Z136 Encounter for screening for cardiovascular disorders: Secondary | ICD-10-CM

## 2019-09-18 DIAGNOSIS — Z0001 Encounter for general adult medical examination with abnormal findings: Secondary | ICD-10-CM

## 2019-09-18 DIAGNOSIS — Z0000 Encounter for general adult medical examination without abnormal findings: Secondary | ICD-10-CM | POA: Diagnosis not present

## 2019-09-18 DIAGNOSIS — N138 Other obstructive and reflux uropathy: Secondary | ICD-10-CM

## 2019-09-18 DIAGNOSIS — I10 Essential (primary) hypertension: Secondary | ICD-10-CM | POA: Diagnosis not present

## 2019-09-18 MED ORDER — ATORVASTATIN CALCIUM 10 MG PO TABS: ORAL_TABLET | ORAL | 3 refills | Status: DC

## 2019-09-19 LAB — URINALYSIS, ROUTINE W REFLEX MICROSCOPIC
Bacteria, UA: NONE SEEN /HPF
Bilirubin Urine: NEGATIVE
Glucose, UA: NEGATIVE
Hgb urine dipstick: NEGATIVE
Hyaline Cast: NONE SEEN /LPF
Ketones, ur: NEGATIVE
Nitrite: NEGATIVE
Protein, ur: NEGATIVE
RBC / HPF: NONE SEEN /HPF (ref 0–2)
Specific Gravity, Urine: 1.019 (ref 1.001–1.03)
Squamous Epithelial / HPF: NONE SEEN /HPF (ref <=5)
pH: 6.5 (ref 5.0–8.0)

## 2019-09-19 LAB — MICROALBUMIN / CREATININE URINE RATIO
Creatinine, Urine: 156 mg/dL (ref 20–320)
Microalb Creat Ratio: 7 mcg/mg creat (ref <30)
Microalb, Ur: 1.1 mg/dL

## 2019-09-19 LAB — CBC WITH DIFFERENTIAL/PLATELET
Absolute Monocytes: 608 cells/uL (ref 200–950)
Basophils Absolute: 59 cells/uL (ref 0–200)
Basophils Relative: 1 %
Eosinophils Absolute: 248 cells/uL (ref 15–500)
Eosinophils Relative: 4.2 %
HCT: 44.7 % (ref 38.5–50.0)
Hemoglobin: 15.7 g/dL (ref 13.2–17.1)
Lymphs Abs: 1782 cells/uL (ref 850–3900)
MCH: 31.9 pg (ref 27.0–33.0)
MCHC: 35.1 g/dL (ref 32.0–36.0)
MCV: 90.9 fL (ref 80.0–100.0)
MPV: 10.5 fL (ref 7.5–12.5)
Monocytes Relative: 10.3 %
Neutro Abs: 3204 cells/uL (ref 1500–7800)
Neutrophils Relative %: 54.3 %
Platelets: 270 10*3/uL (ref 140–400)
RBC: 4.92 10*6/uL (ref 4.20–5.80)
RDW: 12.7 % (ref 11.0–15.0)
Total Lymphocyte: 30.2 %
WBC: 5.9 10*3/uL (ref 3.8–10.8)

## 2019-09-19 LAB — COMPLETE METABOLIC PANEL WITH GFR
AG Ratio: 1.8 (calc) (ref 1.0–2.5)
ALT: 27 U/L (ref 9–46)
AST: 24 U/L (ref 10–35)
Albumin: 4.4 g/dL (ref 3.6–5.1)
Alkaline phosphatase (APISO): 57 U/L (ref 35–144)
BUN: 18 mg/dL (ref 7–25)
CO2: 24 mmol/L (ref 20–32)
Calcium: 10.1 mg/dL (ref 8.6–10.3)
Chloride: 105 mmol/L (ref 98–110)
Creat: 1.05 mg/dL (ref 0.70–1.25)
GFR, Est African American: 85 mL/min/{1.73_m2} (ref >=60)
GFR, Est Non African American: 73 mL/min/{1.73_m2} (ref >=60)
Globulin: 2.4 g/dL (calc) (ref 1.9–3.7)
Glucose, Bld: 102 mg/dL — ABNORMAL HIGH (ref 65–99)
Potassium: 4.6 mmol/L (ref 3.5–5.3)
Sodium: 139 mmol/L (ref 135–146)
Total Bilirubin: 0.8 mg/dL (ref 0.2–1.2)
Total Protein: 6.8 g/dL (ref 6.1–8.1)

## 2019-09-19 LAB — MAGNESIUM: Magnesium: 2 mg/dL (ref 1.5–2.5)

## 2019-09-19 LAB — PSA: PSA: 0.5 ng/mL (ref <=4.0)

## 2019-09-19 LAB — VITAMIN D 25 HYDROXY (VIT D DEFICIENCY, FRACTURES): Vit D, 25-Hydroxy: 72 ng/mL (ref 30–100)

## 2019-09-19 LAB — HEMOGLOBIN A1C
Hgb A1c MFr Bld: 5.6 % of total Hgb (ref <5.7)
Mean Plasma Glucose: 114 (calc)
eAG (mmol/L): 6.3 (calc)

## 2019-09-19 LAB — LIPID PANEL
Cholesterol: 151 mg/dL (ref <200)
HDL: 57 mg/dL (ref >=40)
LDL Cholesterol (Calc): 78 mg/dL (calc)
Non-HDL Cholesterol (Calc): 94 mg/dL (calc) (ref <130)
Total CHOL/HDL Ratio: 2.6 (calc) (ref <5.0)
Triglycerides: 84 mg/dL (ref <150)

## 2019-09-19 LAB — INSULIN, RANDOM: Insulin: 7.4 u[IU]/mL

## 2019-09-19 LAB — TSH: TSH: 1.42 mIU/L (ref 0.40–4.50)

## 2019-10-02 DIAGNOSIS — H43393 Other vitreous opacities, bilateral: Secondary | ICD-10-CM | POA: Diagnosis not present

## 2019-10-02 DIAGNOSIS — H2513 Age-related nuclear cataract, bilateral: Secondary | ICD-10-CM | POA: Diagnosis not present

## 2019-10-03 ENCOUNTER — Ambulatory Visit: Payer: Medicare Other

## 2019-10-08 ENCOUNTER — Other Ambulatory Visit (INDEPENDENT_AMBULATORY_CARE_PROVIDER_SITE_OTHER): Payer: PPO

## 2019-10-08 DIAGNOSIS — Z1212 Encounter for screening for malignant neoplasm of rectum: Secondary | ICD-10-CM

## 2019-10-08 DIAGNOSIS — Z1211 Encounter for screening for malignant neoplasm of colon: Secondary | ICD-10-CM

## 2019-10-08 LAB — POC HEMOCCULT BLD/STL (HOME/3-CARD/SCREEN)
Card #2 Fecal Occult Blod, POC: NEGATIVE
Card #3 Fecal Occult Blood, POC: NEGATIVE
Fecal Occult Blood, POC: NEGATIVE

## 2019-10-17 ENCOUNTER — Other Ambulatory Visit: Payer: Self-pay

## 2019-10-17 ENCOUNTER — Ambulatory Visit (INDEPENDENT_AMBULATORY_CARE_PROVIDER_SITE_OTHER): Payer: PPO

## 2019-10-17 VITALS — Temp 97.3°F

## 2019-10-17 DIAGNOSIS — Z23 Encounter for immunization: Secondary | ICD-10-CM

## 2019-10-19 ENCOUNTER — Ambulatory Visit: Payer: PPO

## 2019-10-22 ENCOUNTER — Ambulatory Visit: Payer: PPO

## 2019-10-25 ENCOUNTER — Encounter: Payer: Self-pay | Admitting: Internal Medicine

## 2019-10-31 NOTE — Progress Notes (Signed)
Assessment and Plan:  Kesean was seen today for medicare wellness and other.  Diagnoses and all orders for this visit:  Flank pain -     Urinalysis w microscopic + reflex cultur Continue to monitor History of kidney stones Last image CT 05/2015  Strain of flexor muscle of right hip, initial encounter Discussed stretches for this Continue to monitor  Other orders -     Urine Culture -     REFLEXIVE URINE CULTURE    Further disposition pending results of labs. Discussed med's effects and SE's.   Over 30 minutes of exam, counseling, chart review, and critical decision making was performed.   Future Appointments  Date Time Provider [place]  12/24/2019  9:30 AM Liane Comber, NP [facility] None  03/24/2020 10:30 AM Unk Pinto, MD [facility] None  06/16/2020  9:00 AM Liane Comber, NP [facility] None  10/23/2020  9:00 AM Unk Pinto, MD [facility] None    ------------------------------------------------------------------------------------------------------------------   HPI 68 y.o.male presents for right flank pain.  Reports he does have a history of kidney stones.  He did not undergo any treatments other than oral medications and passed some of the stones.  Reports for he takes 2-3 beers and it will go away though he has not. This was once recommended to him by a urologist.  He has not done this in recent times.   Reports that the pain is pulse type pain similar to previous experience. He reports a tylenol and it will go away for an extended period of time.  He also reports the pain start on the right flank but wraps around to the front by his hip.  He denies any trauma, hematuria, dysuria, other abdominal pains or cramping, constipation diarrhea, numbness or tingling, chest pains or shortness of breath.    Reports he exerises three times a week including cardio and strength training.  History of kidney stones and las CT 05/2015 -A 5 mm stone is noted near the  upper pole of the right kidney, and multiple left renal stones are seen, measuring up to 8 mm in size. Past some stones at that time.  No longer follows with urology.  Past Medical History:  Diagnosis Date  . Allergy   . 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 Cataract    both eyes no surgery needed  . Chronic kidney disease    kidney stones  . GERD (gastroesophageal reflux disease)   . Hyperlipidemia   . Hypertension    white coat syndrome  . Prediabetes   . Vitamin D deficiency      Allergies  Allergen Reactions  . Hismanal [Astemizole]   . Latex   . Penicillins Hives    Current Outpatient Medications on File Prior to Visit  Medication Sig  . ALPRAZolam (XANAX) 0.5 MG tablet Take 1/2 to 1 tablet 3 x /day as needed for Anxiety  . atorvastatin (LIPITOR) 10 MG tablet Take 1 tablet Daily for Cholesterol  . azelastine (OPTIVAR) 0.05 % ophthalmic solution Place 1 drop into both eyes 2 (two) times daily as needed.  . Cholecalciferol (VITAMIN D PO) Take 5,000 Int'l Units by mouth daily.  . famotidine (PEPCID) 40 MG tablet Take 1 tablet at bedtime for Acid Reflux  . Fexofenadine HCl (ALLEGRA PO) Take by mouth as needed.   . fluticasone (FLONASE) 50 MCG/ACT nasal spray USE 2 SPRAYS IN EACH       NOSTRIL DAILY  . ibuprofen (  ADVIL,MOTRIN) 800 MG tablet Take 800 mg by mouth every 8 (eight) hours as needed.  Marland Kitchen lisinopril (PRINIVIL,ZESTRIL) 10 MG tablet Take 1 tablet (10 mg total) by mouth daily.  Marland Kitchen zinc gluconate 50 MG tablet Take 50 mg by mouth daily.   No current facility-administered medications on file prior to visit.     ROS: all negative except above.   Physical Exam:  BP 118/66   Pulse (!) 55   Temp (!) 97.3 F (36.3 C)   Ht 5\' 9"  (1.753 m)   Wt 186 lb 6.4 oz (84.6 kg)   SpO2 99%   BMI 27.53 kg/m   General Appearance: Well nourished, in no apparent distress. Eyes: PERRLA, EOMs, conjunctiva no swelling or erythema Sinuses: No  Frontal/maxillary tenderness ENT/Mouth: Ext aud canals clear, TMs without erythema, bulging. No erythema, swelling, or exudate on post pharynx.  Tonsils not swollen or erythematous. Hearing normal.  Neck: Supple, thyroid normal.  Respiratory: Respiratory effort normal, BS equal bilaterally without rales, rhonchi, wheezing or stridor.  Cardio: RRR with no MRGs. Brisk peripheral pulses without edema.  Abdomen: Soft, + BS.  Non tender, no guarding, rebound, hernias, masses. No CVA tenderness. Lymphatics: Non tender without lymphadenopathy.  Musculoskeletal: Full ROM, 5/5 strength, normal gait. Skin: Warm, dry without rashes, lesions, ecchymosis. Mild psoas tenderness to deep palpation. Neuro: Cranial nerves intact. Normal muscle tone, no cerebellar symptoms. Sensation intact.  Psych: Awake and oriented X 3, normal affect, Insight and Judgment appropriate.     Garnet Sierras, NP 10:35 AM [facility]

## 2019-11-01 ENCOUNTER — Encounter: Payer: Self-pay | Admitting: Adult Health Nurse Practitioner

## 2019-11-01 ENCOUNTER — Ambulatory Visit (INDEPENDENT_AMBULATORY_CARE_PROVIDER_SITE_OTHER): Payer: PPO | Admitting: Adult Health Nurse Practitioner

## 2019-11-01 ENCOUNTER — Other Ambulatory Visit: Payer: Self-pay

## 2019-11-01 VITALS — BP 118/66 | HR 55 | Temp 97.3°F | Ht 69.0 in | Wt 186.4 lb

## 2019-11-01 DIAGNOSIS — R8290 Unspecified abnormal findings in urine: Secondary | ICD-10-CM

## 2019-11-01 DIAGNOSIS — R109 Unspecified abdominal pain: Secondary | ICD-10-CM

## 2019-11-01 DIAGNOSIS — S76011A Strain of muscle, fascia and tendon of right hip, initial encounter: Secondary | ICD-10-CM

## 2019-11-01 NOTE — Patient Instructions (Signed)
Continue to monitor your symptoms.  Should you have persistent abdominal pain not relived by pain medication or nausea vomiting, please seek immediate attention.  Be sure to drink 64-80oz of water a day.  We should consider getting another image or referral to Urology should your discomfort cause interruption in your daily activities.  Today we are going to check a urine and we eill get back to you in 1-3 days.    Kidney Stones  Kidney stones (urolithiasis) are rock-like masses that form inside of the kidneys. Kidneys are organs that make pee (urine). A kidney stone can cause very bad pain and can block the flow of pee. The stone usually leaves your body (passes) through your pee. You may need to have a doctor take out the stone. Follow these instructions at home: Eating and drinking  Drink enough fluid to keep your pee clear or pale yellow. This will help you pass the stone.  If told by your doctor, change the foods you eat (your diet). This may include: ? Limiting how much salt (sodium) you eat. ? Eating more fruits and vegetables. ? Limiting how much meat, poultry, fish, and eggs you eat.  Follow instructions from your doctor about eating or drinking restrictions. General instructions  Collect pee samples as told by your doctor. You may need to collect a pee sample: ? 24 hours after a stone comes out. ? 8-12 weeks after a stone comes out, and every 6-12 months after that.  Strain your pee every time you pee (urinate), for as long as told. Use the strainer that your doctor recommends.  Do not throw out the stone. Keep it so that it can be tested by your doctor.  Take over-the-counter and prescription medicines only as told by your doctor.  Keep all follow-up visits as told by your doctor. This is important. You may need follow-up tests. Preventing kidney stones To prevent another kidney stone:  Drink enough fluid to keep your pee clear or pale yellow. This is the best way  to prevent kidney stones.  Eat healthy foods.  Avoid certain foods as told by your doctor. You may be told to eat less protein.  Stay at a healthy weight. Contact a doctor if:  You have pain that gets worse or does not get better with medicine. Get help right away if:  You have a fever or chills.  You get very bad pain.  You get new pain in your belly (abdomen).  You pass out (faint).  You cannot pee. This information is not intended to replace advice given to you by your health care provider. Make sure you discuss any questions you have with your health care provider. Document Released: 05/24/2008 Document Revised: 07/18/2018 Document Reviewed: 08/24/2016 Elsevier Patient Education  2020 [employer].  This is just general information:    Cholelithiasis  Cholelithiasis is also called "gallstones." It is a kind of gallbladder disease. The gallbladder is an organ that stores a liquid (bile) that helps you digest fat. Gallstones may not cause symptoms (may be silent gallstones) until they cause a blockage, and then they can cause pain (gallbladder attack). Follow these instructions at home:  Take over-the-counter and prescription medicines only as told by your doctor.  Stay at a healthy weight.  Eat healthy foods. This includes: ? Eating fewer fatty foods, like fried foods. ? Eating fewer refined carbs (refined carbohydrates). Refined carbs are breads and grains that are highly processed, like white bread and white rice. Instead,  choose whole grains like whole-wheat bread and brown rice. ? Eating more fiber. Almonds, fresh fruit, and beans are healthy sources of fiber.  Keep all follow-up visits as told by your doctor. This is important. Contact a doctor if:  You have sudden pain in the upper right side of your belly (abdomen). Pain might spread to your right shoulder or your chest. This may be a sign of a gallbladder attack.  You feel sick to your stomach (are  nauseous).  You throw up (vomit).  You have been diagnosed with gallstones that have no symptoms and you get: ? Belly pain. ? Discomfort, burning, or fullness in the upper part of your belly (indigestion). Get help right away if:  You have sudden pain in the upper right side of your belly, and it lasts for more than 2 hours.  You have belly pain that lasts for more than 5 hours.  You have a fever or chills.  You keep feeling sick to your stomach or you keep throwing up.  Your skin or the whites of your eyes turn yellow (jaundice).  You have dark-colored pee (urine).  You have light-colored poop (stool). Summary  Cholelithiasis is also called "gallstones."  The gallbladder is an organ that stores a liquid (bile) that helps you digest fat.  Silent gallstones are gallstones that do not cause symptoms.  A gallbladder attack may cause sudden pain in the upper right side of your belly. Pain might spread to your right shoulder or your chest. If this happens, contact your doctor.  If you have sudden pain in the upper right side of your belly that lasts for more than 2 hours, get help right away. This information is not intended to replace advice given to you by your health care provider. Make sure you discuss any questions you have with your health care provider. Document Released: 05/24/2008 Document Revised: 11/18/2017 Document Reviewed: 08/22/2016 Elsevier Patient Education  2020 [employer].

## 2019-11-03 LAB — URINALYSIS W MICROSCOPIC + REFLEX CULTURE
Bacteria, UA: NONE SEEN /HPF
Bilirubin Urine: NEGATIVE
Glucose, UA: NEGATIVE
Hgb urine dipstick: NEGATIVE
Hyaline Cast: NONE SEEN /LPF
Ketones, ur: NEGATIVE
Nitrites, Initial: NEGATIVE
Protein, ur: NEGATIVE
RBC / HPF: NONE SEEN /HPF (ref 0–2)
Specific Gravity, Urine: 1.017 (ref 1.001–1.03)
Squamous Epithelial / HPF: NONE SEEN /HPF (ref <=5)
pH: 6.5 (ref 5.0–8.0)

## 2019-11-03 LAB — URINE CULTURE
MICRO NUMBER:: 1097862
Result:: NO GROWTH
SPECIMEN QUALITY:: ADEQUATE

## 2019-11-03 LAB — CULTURE INDICATED

## 2019-11-10 ENCOUNTER — Other Ambulatory Visit: Payer: Self-pay | Admitting: Physician Assistant

## 2019-12-07 ENCOUNTER — Ambulatory Visit: Payer: Self-pay | Admitting: Physician Assistant

## 2019-12-24 ENCOUNTER — Ambulatory Visit: Payer: PPO | Admitting: Adult Health

## 2020-01-22 ENCOUNTER — Ambulatory Visit: Payer: Medicare Other

## 2020-01-27 ENCOUNTER — Ambulatory Visit: Payer: Medicare Other

## 2020-01-31 NOTE — Progress Notes (Signed)
FOLLOW UP  Assessment and Plan:   Essential hypertension - continue medications, DASH diet, exercise and monitor at home. Call if greater than 130/80.  - will add back on lisinopril 10mg - goal is 120 -     CBC with Differential/Platelet -     CMP/GFR -     TSH  Hyperlipidemia, unspecified hyperlipidemia type -continue medications, check lipids, decrease fatty foods, increase activity.  -     Lipid panel  Hx of prediabetes Recent A1Cs at goal Discussed diet/exercise, weight management  Defer A1C; check CMP  Situational anxiety Monitor, PRN xanax   Vitamin D deficiency Continue supplement, check levels annually and PRN  BMI 27 - long discussion about weight loss, diet, and exercise -recommended diet heavy in fruits and veggies and low in animal meats, cheeses, and dairy products  Medication management -     Magnesium  GERD Initiated medication for new reflux related symptoms: omeprazole 20 mg daily, HS Taper off famotidine in 2 weeks if sx well controlled Discussed diet, avoiding triggers and other lifestyle changes   Continue diet and meds as discussed. Further disposition pending results of labs. Discussed med's effects and SE's.   Over 30 minutes of exam, counseling, chart review, and critical decision making was performed.   Future Appointments  Date Time Provider [place]  05/21/2020  9:30 AM Unk Pinto, MD [facility] None  08/21/2020  9:00 AM Liane Comber, NP [facility] None  12/02/2020  9:00 AM Unk Pinto, MD [facility] None    ----------------------------------------------------------------------------------------------------------------------  HPI 69 y.o. male  presents for 3 month follow up on hypertension, cholesterol, glucose management (hx of prediabetes), weight and vitamin D deficiency.   He has GERD and is famotine 40 mg daily; describes poorly controlled sx, having to take tums during day and night most days for breakthrough  reflux. Ranitidine and lansoprazole reprotedly worked well for him in the past.   He has situation anxiety and is prescribed PRN xanax, only takes prior to Dr's visits, significantly improves htn readings, hx of white coat syndrome.   BMI is Body mass index is 27.7 kg/m., he has been working on diet and exercise, does weights at home gym, walking 2 miles daily.  Whole grains for breakfast with eggs, veggie heavy diet, prefers over meat, will do chicken, limited beef, occasional shellfish  Eats 3 meals Wt Readings from Last 3 Encounters:  02/04/20 187 lb 9.6 oz (85.1 kg)  11/01/19 186 lb 6.4 oz (84.6 kg)  09/18/19 186 lb (84.4 kg)   His blood pressure has been controlled at home, today their BP is BP: 124/76  He does workout. He denies chest pain, shortness of breath, dizziness.   He is on cholesterol medication Atorvastatin 10 mg every other day and denies myalgias. His cholesterol is at goal of LDL <100. The cholesterol last visit was:   Lab Results  Component Value Date   CHOL 151 09/18/2019   HDL 57 09/18/2019   LDLCALC 78 09/18/2019   TRIG 84 09/18/2019   CHOLHDL 2.6 09/18/2019    He has been working on diet and exercise for prediabetes, and denies increased appetite, nausea, paresthesia of the feet, polydipsia, polyuria, visual disturbances and vomiting. Last A1C in the office was:  Lab Results  Component Value Date   HGBA1C 5.6 09/18/2019    Last GFR:  Lab Results  Component Value Date   GFRNONAA 73 09/18/2019   Patient is on Vitamin D supplement.   Lab Results  Component Value Date  VD25OH 72 09/18/2019       Current Medications:  Current Outpatient Medications on File Prior to Visit  Medication Sig  . ALPRAZolam (XANAX) 0.5 MG tablet Take 1/2 to 1 tablet 3 x /day as needed for Anxiety  . atorvastatin (LIPITOR) 10 MG tablet Take 1 tablet Daily for Cholesterol  . azelastine (OPTIVAR) 0.05 % ophthalmic solution Place 1 drop into both eyes 2 (two) times daily as  needed.  . Cholecalciferol (VITAMIN D PO) Take 5,000 Int'l Units by mouth daily.  . famotidine (PEPCID) 40 MG tablet Take 1 tablet at bedtime for Acid Reflux  . Fexofenadine HCl (ALLEGRA PO) Take by mouth as needed.   . fluticasone (FLONASE) 50 MCG/ACT nasal spray USE 2 SPRAYS IN EACH       NOSTRIL DAILY  . ibuprofen (ADVIL,MOTRIN) 800 MG tablet Take 800 mg by mouth every 8 (eight) hours as needed.  Marland Kitchen lisinopril (ZESTRIL) 10 MG tablet Take 1 tablet Daily for BP  . zinc gluconate 50 MG tablet Take 50 mg by mouth daily.   No current facility-administered medications on file prior to visit.     Allergies:  Allergies  Allergen Reactions  . Hismanal [Astemizole]   . Latex   . Penicillins Hives     Medical History:  Past Medical History:  Diagnosis Date  . Allergy   . 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 Cataract    both eyes no surgery needed  . Chronic kidney disease    kidney stones  . GERD (gastroesophageal reflux disease)   . Hyperlipidemia   . Hypertension    white coat syndrome  . Prediabetes   . Vitamin D deficiency    Family history- Reviewed and unchanged Social history- Reviewed and unchanged   Review of Systems:  ROS    Physical Exam: BP 124/76   Pulse 68   Temp (!) 97.5 F (36.4 C)   Ht 5\' 9"  (1.753 m)   Wt 187 lb 9.6 oz (85.1 kg)   SpO2 97%   BMI 27.70 kg/m  Wt Readings from Last 3 Encounters:  02/04/20 187 lb 9.6 oz (85.1 kg)  11/01/19 186 lb 6.4 oz (84.6 kg)  09/18/19 186 lb (84.4 kg)   General Appearance: Well nourished, in no apparent distress. Eyes: PERRLA, EOMs, conjunctiva no swelling or erythema Sinuses: No Frontal/maxillary tenderness ENT/Mouth: Ext aud canals clear, TMs without erythema, bulging. No erythema, swelling, or exudate on post pharynx.  Tonsils not swollen or erythematous. HOH, bil hearing aids.   Neck: Supple, thyroid normal.  Respiratory: Respiratory effort normal, BS equal bilaterally  without rales, rhonchi, wheezing or stridor.  Cardio: RRR with no MRGs. Brisk peripheral pulses without edema.  Abdomen: Soft, + BS.  Non tender, no guarding, rebound, hernias, masses. Lymphatics: Non tender without lymphadenopathy.  Musculoskeletal: Full ROM, 5/5 strength, Normal gait Skin: Warm, dry without rashes, lesions, ecchymosis.  Neuro: Cranial nerves intact. No cerebellar symptoms.  Psych: Awake and oriented X 3, normal affect, Insight and Judgment appropriate.    Izora Ribas, NP 9:43 AM [facility]

## 2020-02-04 ENCOUNTER — Ambulatory Visit (INDEPENDENT_AMBULATORY_CARE_PROVIDER_SITE_OTHER): Payer: PPO | Admitting: Adult Health

## 2020-02-04 ENCOUNTER — Encounter: Payer: Self-pay | Admitting: Adult Health

## 2020-02-04 ENCOUNTER — Other Ambulatory Visit: Payer: Self-pay

## 2020-02-04 VITALS — BP 124/76 | HR 68 | Temp 97.5°F | Ht 69.0 in | Wt 187.6 lb

## 2020-02-04 DIAGNOSIS — E785 Hyperlipidemia, unspecified: Secondary | ICD-10-CM

## 2020-02-04 DIAGNOSIS — K219 Gastro-esophageal reflux disease without esophagitis: Secondary | ICD-10-CM

## 2020-02-04 DIAGNOSIS — E559 Vitamin D deficiency, unspecified: Secondary | ICD-10-CM

## 2020-02-04 DIAGNOSIS — R7303 Prediabetes: Secondary | ICD-10-CM

## 2020-02-04 DIAGNOSIS — I10 Essential (primary) hypertension: Secondary | ICD-10-CM | POA: Diagnosis not present

## 2020-02-04 DIAGNOSIS — Z6827 Body mass index (BMI) 27.0-27.9, adult: Secondary | ICD-10-CM | POA: Diagnosis not present

## 2020-02-04 DIAGNOSIS — F418 Other specified anxiety disorders: Secondary | ICD-10-CM

## 2020-02-04 MED ORDER — OMEPRAZOLE 20 MG PO CPDR: 20.0000 mg | DELAYED_RELEASE_CAPSULE | Freq: Every day | ORAL | 1 refills | Status: DC

## 2020-02-04 NOTE — Patient Instructions (Addendum)
Goals    . DIET - INCREASE WATER INTAKE     65-80+ fluid ounces    . Weight (lb) < 175 lb (79.4 kg) (pt-stated)        Omeprazole once daily for acid reflux - 1 hour before bedtime  Continue famotidine for 2 weeks, then can cut back and keep as needed only if symptoms are well controlled - message if any questions or concerns    Omeprazole tablets What is this medicine? OMEPRAZOLE (oh ME pray zol) prevents the production of acid in the stomach. It is used to treat the symptoms of heartburn. You can buy this medicine without a prescription. This product is not for long-term use, unless otherwise directed by your doctor or health care professional. This medicine may be used for other purposes; ask your health care provider or pharmacist if you have questions. COMMON BRAND NAME(S): Prilosec OTC What should I tell my health care provider before I take this medicine? They need to know if you have any of these conditions:  black or bloody stools  chest pain  difficulty swallowing  have had heartburn for over 3 months  have heartburn with dizziness, lightheadedness or sweating  liver disease  lupus  stomach pain  unexplained weight loss  vomiting with blood  wheezing  an unusual or allergic reaction to omeprazole, other medicines, foods, dyes, or preservatives  pregnant or trying to get pregnant  breast-feeding How should I use this medicine? Take this medicine by mouth with a glass of water. Follow the directions on the product label. Do not cut, crush or chew this medicine. Swallow the tablets whole. Take this medicine on an empty stomach, at least 30 minutes before breakfast. Take your medicine at regular intervals. Do not take it more often than directed. Talk to your pediatrician regarding the use of this medicine in children. Special care may be needed. Overdosage: If you think you have taken too much of this medicine contact a poison control center or emergency  room at once. NOTE: This medicine is only for you. Do not share this medicine with others. What if I miss a dose? If you miss a dose, take it as soon as you can. If it is almost time for your next dose, take only that dose. Do not take double or extra doses. What may interact with this medicine? Do not take this medicine with any of the following medications:  atazanavir  clopidogrel  nelfinavir  rilpivirine This medicine may also interact with the following medications:  antifungals like itraconazole, ketoconazole, and voriconazole  certain antivirals for HIV or hepatitis  certain medicines that treat or prevent blood clots like warfarin  cilostazol  citalopram  cyclosporine  dasatinib  digoxin  disulfiram  diuretics  erlotinib  iron supplements  medicines for anxiety, panic, and sleep like diazepam  medicines for seizures like carbamazepine, phenobarbital, phenytoin  methotrexate  mycophenolate mofetil  nilotinib  rifampin  St. John's wort  tacrolimus  vitamin B12 This list may not describe all possible interactions. Give your health care provider a list of all the medicines, herbs, non-prescription drugs, or dietary supplements you use. Also tell them if you smoke, drink alcohol, or use illegal drugs. Some items may interact with your medicine. What should I watch for while using this medicine? It can take several days before your heartburn gets better. Tell your healthcare professional if your symptoms do not start to get better or if they get worse. If you need to  take this medicine for more than 14 days, talk to your healthcare professional. Heartburn may sometimes be caused by a more serious condition. This medicine may cause a decrease in vitamin B12. You should make sure that you get enough vitamin B12 while you are taking this medicine. Discuss the foods you eat and the vitamins you take with your health care professional. What side effects may I  notice from receiving this medicine? Side effects that you should report to your doctor or health care professional as soon as possible:  allergic reactions like skin rash, itching or hives, swelling of the face, lips, or tongue  bone pain  breathing problems  fever or sore throat  joint pain  rash on cheeks or arms that gets worse in the sun  redness, blistering, peeling, or loosening of the skin, including inside the mouth  severe diarrhea  signs and symptoms of kidney injury like trouble passing urine or change in the amount of urine  signs and symptoms of low magnesium like muscle cramps; muscle pain; muscle weakness; tremors; seizures; or fast, irregular heartbeat  stomach polyps  unusual bleeding or bruising Side effects that usually do not require medical attention (report to your doctor or health care professional if they continue or are bothersome):  diarrhea  dry mouth  gas  headache  nausea  stomach pain This list may not describe all possible side effects. Call your doctor for medical advice about side effects. You may report side effects to FDA at 1-800-FDA-1088. Where should I keep my medicine? Keep out of the reach of children. Store at room temperature between 20 and 25 degrees C (68 and 77 degrees F). Protect from light and moisture. Throw away any unused medicine after the expiration date. NOTE: This sheet is a summary. It may not cover all possible information. If you have questions about this medicine, talk to your doctor, pharmacist, or health care provider.  2020 Elsevier/Gold Standard (2018-09-27 13:06:30)

## 2020-02-05 LAB — CBC WITH DIFFERENTIAL/PLATELET
Absolute Monocytes: 730 cells/uL (ref 200–950)
Basophils Absolute: 58 cells/uL (ref 0–200)
Basophils Relative: 0.8 %
Eosinophils Absolute: 511 cells/uL — ABNORMAL HIGH (ref 15–500)
Eosinophils Relative: 7 %
HCT: 46 % (ref 38.5–50.0)
Hemoglobin: 16.3 g/dL (ref 13.2–17.1)
Lymphs Abs: 2008 cells/uL (ref 850–3900)
MCH: 32.1 pg (ref 27.0–33.0)
MCHC: 35.4 g/dL (ref 32.0–36.0)
MCV: 90.7 fL (ref 80.0–100.0)
MPV: 10.5 fL (ref 7.5–12.5)
Monocytes Relative: 10 %
Neutro Abs: 3993 cells/uL (ref 1500–7800)
Neutrophils Relative %: 54.7 %
Platelets: 294 10*3/uL (ref 140–400)
RBC: 5.07 10*6/uL (ref 4.20–5.80)
RDW: 13 % (ref 11.0–15.0)
Total Lymphocyte: 27.5 %
WBC: 7.3 10*3/uL (ref 3.8–10.8)

## 2020-02-05 LAB — COMPLETE METABOLIC PANEL WITH GFR
AG Ratio: 1.5 (calc) (ref 1.0–2.5)
ALT: 38 U/L (ref 9–46)
AST: 26 U/L (ref 10–35)
Albumin: 4.3 g/dL (ref 3.6–5.1)
Alkaline phosphatase (APISO): 65 U/L (ref 35–144)
BUN: 19 mg/dL (ref 7–25)
CO2: 25 mmol/L (ref 20–32)
Calcium: 10.1 mg/dL (ref 8.6–10.3)
Chloride: 106 mmol/L (ref 98–110)
Creat: 1.07 mg/dL (ref 0.70–1.25)
GFR, Est African American: 82 mL/min/{1.73_m2} (ref >=60)
GFR, Est Non African American: 71 mL/min/{1.73_m2} (ref >=60)
Globulin: 2.8 g/dL (calc) (ref 1.9–3.7)
Glucose, Bld: 99 mg/dL (ref 65–99)
Potassium: 4.6 mmol/L (ref 3.5–5.3)
Sodium: 139 mmol/L (ref 135–146)
Total Bilirubin: 0.6 mg/dL (ref 0.2–1.2)
Total Protein: 7.1 g/dL (ref 6.1–8.1)

## 2020-02-05 LAB — LIPID PANEL
Cholesterol: 155 mg/dL (ref <200)
HDL: 59 mg/dL (ref >=40)
LDL Cholesterol (Calc): 79 mg/dL (calc)
Non-HDL Cholesterol (Calc): 96 mg/dL (calc) (ref <130)
Total CHOL/HDL Ratio: 2.6 (calc) (ref <5.0)
Triglycerides: 89 mg/dL (ref <150)

## 2020-02-05 LAB — MAGNESIUM: Magnesium: 2.2 mg/dL (ref 1.5–2.5)

## 2020-02-05 LAB — TSH: TSH: 1.69 mIU/L (ref 0.40–4.50)

## 2020-02-13 DIAGNOSIS — L821 Other seborrheic keratosis: Secondary | ICD-10-CM | POA: Diagnosis not present

## 2020-02-13 DIAGNOSIS — Z85828 Personal history of other malignant neoplasm of skin: Secondary | ICD-10-CM | POA: Diagnosis not present

## 2020-02-13 DIAGNOSIS — L814 Other melanin hyperpigmentation: Secondary | ICD-10-CM | POA: Diagnosis not present

## 2020-02-13 DIAGNOSIS — D225 Melanocytic nevi of trunk: Secondary | ICD-10-CM | POA: Diagnosis not present

## 2020-02-13 DIAGNOSIS — L578 Other skin changes due to chronic exposure to nonionizing radiation: Secondary | ICD-10-CM | POA: Diagnosis not present

## 2020-02-13 DIAGNOSIS — Z23 Encounter for immunization: Secondary | ICD-10-CM | POA: Diagnosis not present

## 2020-02-13 DIAGNOSIS — Z85820 Personal history of malignant melanoma of skin: Secondary | ICD-10-CM | POA: Diagnosis not present

## 2020-02-13 DIAGNOSIS — Z86018 Personal history of other benign neoplasm: Secondary | ICD-10-CM | POA: Diagnosis not present

## 2020-02-13 DIAGNOSIS — L309 Dermatitis, unspecified: Secondary | ICD-10-CM | POA: Diagnosis not present

## 2020-02-22 ENCOUNTER — Other Ambulatory Visit: Payer: Self-pay | Admitting: Internal Medicine

## 2020-02-22 DIAGNOSIS — K219 Gastro-esophageal reflux disease without esophagitis: Secondary | ICD-10-CM

## 2020-03-24 ENCOUNTER — Ambulatory Visit: Payer: PPO | Admitting: Internal Medicine

## 2020-05-20 ENCOUNTER — Encounter: Payer: Self-pay | Admitting: Internal Medicine

## 2020-05-20 NOTE — Progress Notes (Addendum)
RESCHEDULED

## 2020-05-21 ENCOUNTER — Ambulatory Visit: Payer: PPO | Admitting: Internal Medicine

## 2020-06-10 ENCOUNTER — Encounter: Payer: Self-pay | Admitting: Internal Medicine

## 2020-06-10 NOTE — Patient Instructions (Signed)

## 2020-06-10 NOTE — Progress Notes (Signed)
History of Present Illness:       This very nice 69 y.o.  MWM presents for 6 month follow up with HTN, HLD, Pre-Diabetes and Vitamin D Deficiency. Patient has GERD controlled on his Prilosec.      Patient is treated for HTN (1995) & BP has been controlled at home. Today's BP is elevated at 152/88. Patient report occasional isolated palpitations, but has had no complaints of any cardiac type chest pain,  dyspnea / orthopnea / PND, dizziness, claudication, or dependent edema.      Hyperlipidemia is controlled with diet & meds. Patient denies myalgias or other med SE's. Last Lipids were at goal:  Lab Results  Component Value Date   CHOL 155 02/04/2020   HDL 59 02/04/2020   LDLCALC 79 02/04/2020   TRIG 89 02/04/2020   CHOLHDL 2.6 02/04/2020    Also, the patient has history of PreDiabetes (A1c 6.0% / 2012) and has had no symptoms of reactive hypoglycemia, diabetic polys, paresthesias or visual blurring.  Last A1c was Normal & at goal:  Lab Results  Component Value Date   HGBA1C 5.6 09/18/2019           Further, the patient also has history of Vitamin D Deficiency ("34" / 2012)  and supplements vitamin D without any suspected side-effects. Last vitamin D was at goal:  Lab Results  Component Value Date   VD25OH 72 09/18/2019    Current Outpatient Medications on File Prior to Visit  Medication Sig  . ALPRAZolam (XANAX) 0.5 MG tablet Take 1/2 to 1 tablet 3 x /day as needed for Anxiety  . atorvastatin (LIPITOR) 10 MG tablet Take 1 tablet Daily for Cholesterol  . azelastine (OPTIVAR) 0.05 % ophthalmic solution Place 1 drop into both eyes 2 (two) times daily as needed.  . Cholecalciferol (VITAMIN D PO) Take 5,000 Int'l Units by mouth daily.  Marland Kitchen Fexofenadine HCl (ALLEGRA PO) Take by mouth as needed.   . fluticasone (FLONASE) 50 MCG/ACT nasal spray USE 2 SPRAYS IN EACH       NOSTRIL DAILY  . ibuprofen (ADVIL,MOTRIN) 800 MG tablet Take 800 mg by mouth every 8 (eight) hours as  needed.  Marland Kitchen lisinopril (ZESTRIL) 10 MG tablet Take 1 tablet Daily for BP  . omeprazole (PRILOSEC) 20 MG capsule Take 1 capsule (20 mg total) by mouth daily.  Marland Kitchen zinc gluconate 50 MG tablet Take 50 mg by mouth daily.  . famotidine (PEPCID) 40 MG tablet TAKE 1 TABLET AT BEDTIME FOR ACID REFLUX (Patient not taking: Reported on 06/11/2020)   No current facility-administered medications on file prior to visit.    Allergies  Allergen Reactions  . Hismanal [Astemizole]   . Latex   . Penicillins Hives    PMHx:   Past Medical History:  Diagnosis Date  . Allergy   . 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 Cataract    both eyes no surgery needed  . Chronic kidney disease    kidney stones  . GERD (gastroesophageal reflux disease)   . Hyperlipidemia   . Hypertension    white coat syndrome  . Prediabetes   . Vitamin D deficiency     Immunization History  Administered Date(s) Administered  . Influenza, High Dose Seasonal PF 10/12/2017, 10/13/2018, 10/17/2019  . Influenza-Unspecified 10/23/2015  . Moderna SARS-COVID-2 Vaccination 01/24/2020, 02/21/2020  . Pneumococcal Conjugate-13 07/25/2017  . Pneumococcal Polysaccharide-23 06/10/2011, 07/26/2018  . Td 04/14/2006  .  Tdap 07/20/2016  . Zoster 01/16/2013    Past Surgical History:  Procedure Laterality Date  . BASAL CELL CARCINOMA EXCISION    . COLONOSCOPY    . ELBOW ARTHROSCOPY WITH TENDON RECONSTRUCTION Left 2013  . GUM SURGERY Left 2017   bone graft, root canal left side  . KNEE ARTHROSCOPY Right 1982, 1999  . melanoma excision     cheek lt.  . SPLENECTOMY      FHx:    Reviewed / unchanged  SHx:    Reviewed / unchanged   Systems Review:  Constitutional: Denies fever, chills, wt changes, headaches, insomnia, fatigue, night sweats, change in appetite. Eyes: Denies redness, blurred vision, diplopia, discharge, itchy, watery eyes.  ENT: Denies discharge, congestion, post nasal drip, epistaxis,  sore throat, earache, hearing loss, dental pain, tinnitus, vertigo, sinus pain, snoring.  CV: Denies chest pain, palpitations, irregular heartbeat, syncope, dyspnea, diaphoresis, orthopnea, PND, claudication or edema. Respiratory: denies cough, dyspnea, DOE, pleurisy, hoarseness, laryngitis, wheezing.  Gastrointestinal: Denies dysphagia, odynophagia, heartburn, reflux, water brash, abdominal pain or cramps, nausea, vomiting, bloating, diarrhea, constipation, hematemesis, melena, hematochezia  or hemorrhoids. Genitourinary: Denies dysuria, frequency, urgency, nocturia, hesitancy, discharge, hematuria or flank pain. Musculoskeletal: Denies arthralgias, myalgias, stiffness, jt. swelling, pain, limping or strain/sprain.  Skin: Denies pruritus, rash, hives, warts, acne, eczema or change in skin lesion(s). Neuro: No weakness, tremor, incoordination, spasms, paresthesia or pain. Psychiatric: Denies confusion, memory loss or sensory loss. Endo: Denies change in weight, skin or hair change.  Heme/Lymph: No excessive bleeding, bruising or enlarged lymph nodes.  Physical Exam  BP (!) 152/88   Pulse 60   Temp (!) 97.1 F (36.2 C)   Resp 16   Ht 5\' 9"  (1.753 m)   Wt 186 lb 6.4 oz (84.6 kg)   BMI 27.53 kg/m   Appears  well nourished, well groomed  and in no distress.  Eyes: PERRLA, EOMs, conjunctiva no swelling or erythema. Sinuses: No frontal/maxillary tenderness ENT/Mouth: EAC's clear, TM's nl w/o erythema, bulging. Nares clear w/o erythema, swelling, exudates. Oropharynx clear without erythema or exudates. Oral hygiene is good. Tongue normal, non obstructing. Hearing intact.  Neck: Supple. Thyroid not palpable. Car 2+/2+ without bruits, nodes or JVD. Chest: Respirations nl with BS clear & equal w/o rales, rhonchi, wheezing or stridor.  Cor: Heart sounds normal w/ regular rate and rhythm without sig. murmurs, gallops, clicks or rubs. Peripheral pulses normal and equal  without edema.  Abdomen:  Soft & bowel sounds normal. Non-tender w/o guarding, rebound, hernias, masses or organomegaly.  Lymphatics: Unremarkable.  Musculoskeletal: Full ROM all peripheral extremities, joint stability, 5/5 strength and normal gait.  Skin: Warm, dry without exposed rashes, lesions or ecchymosis apparent.  Neuro: Cranial nerves intact, reflexes equal bilaterally. Sensory-motor testing grossly intact. Tendon reflexes grossly intact.  Pysch: Alert & oriented x 3.  Insight and judgement nl & appropriate. No ideations.  Assessment and Plan:  1. Essential hypertension  -  D/C Lisinopril & replace with Benicar 40 mg &  monitor blood pressures at home.  - Continue DASH diet.  Reminder to go to the ER if any CP,  SOB, nausea, dizziness, severe HA, changes vision/speech.  - CBC with Differential/Platelet - COMPLETE METABOLIC PANEL WITH GFR - Magnesium - TSH  2. Hyperlipidemia, mixed  - Continue diet/meds, exercise,& lifestyle modifications.  - Continue monitor periodic cholesterol/liver & renal functions   - Lipid panel - TSH  3. Abnormal glucose  - Continue diet, exercise  - Lifestyle modifications.  - Monitor  appropriate labs.  - Hemoglobin A1c - Insulin, random  4. Vitamin D deficiency  - Continue supplementation.  - VITAMIN D 25 Hydroxy   5. Prediabetes  - Continue diet, exercise  - Lifestyle modifications.  - Monitor appropriate labs.  - Hemoglobin A1c - Insulin, random  6. Medication management  - CBC with Differential/Platelet - COMPLETE METABOLIC PANEL WITH GFR - Magnesium - Lipid panel - TSH - Hemoglobin A1c - Insulin, random - VITAMIN D 25 Hydroxy        Discussed  regular exercise, BP monitoring, weight control to achieve/maintain BMI less than 25 and discussed med and SE's. Recommended labs to assess and monitor clinical status with further disposition pending results of labs.  I discussed the assessment and treatment plan with the patient. The patient was  provided an opportunity to ask questions and all were answered. The patient agreed with the plan and demonstrated an understanding of the instructions.  I provided over 30 minutes of exam, counseling, chart review and  complex critical decision making.   Kirtland Bouchard, MD

## 2020-06-11 ENCOUNTER — Ambulatory Visit (INDEPENDENT_AMBULATORY_CARE_PROVIDER_SITE_OTHER): Payer: PPO | Admitting: Internal Medicine

## 2020-06-11 ENCOUNTER — Other Ambulatory Visit: Payer: Self-pay

## 2020-06-11 VITALS — BP 152/88 | HR 60 | Temp 97.1°F | Resp 16 | Ht 69.0 in | Wt 186.4 lb

## 2020-06-11 DIAGNOSIS — E559 Vitamin D deficiency, unspecified: Secondary | ICD-10-CM

## 2020-06-11 DIAGNOSIS — R7303 Prediabetes: Secondary | ICD-10-CM

## 2020-06-11 DIAGNOSIS — R002 Palpitations: Secondary | ICD-10-CM

## 2020-06-11 DIAGNOSIS — Z79899 Other long term (current) drug therapy: Secondary | ICD-10-CM | POA: Diagnosis not present

## 2020-06-11 DIAGNOSIS — I10 Essential (primary) hypertension: Secondary | ICD-10-CM

## 2020-06-11 DIAGNOSIS — E782 Mixed hyperlipidemia: Secondary | ICD-10-CM | POA: Diagnosis not present

## 2020-06-11 DIAGNOSIS — R7309 Other abnormal glucose: Secondary | ICD-10-CM | POA: Diagnosis not present

## 2020-06-11 MED ORDER — OLMESARTAN MEDOXOMIL 40 MG PO TABS: ORAL_TABLET | ORAL | 0 refills | Status: DC

## 2020-06-12 LAB — CBC WITH DIFFERENTIAL/PLATELET
Absolute Monocytes: 738 cells/uL (ref 200–950)
Basophils Absolute: 62 cells/uL (ref 0–200)
Basophils Relative: 0.9 %
Eosinophils Absolute: 359 cells/uL (ref 15–500)
Eosinophils Relative: 5.2 %
HCT: 43.7 % (ref 38.5–50.0)
Hemoglobin: 15.6 g/dL (ref 13.2–17.1)
Lymphs Abs: 2574 cells/uL (ref 850–3900)
MCH: 31.9 pg (ref 27.0–33.0)
MCHC: 35.7 g/dL (ref 32.0–36.0)
MCV: 89.4 fL (ref 80.0–100.0)
MPV: 10.6 fL (ref 7.5–12.5)
Monocytes Relative: 10.7 %
Neutro Abs: 3167 cells/uL (ref 1500–7800)
Neutrophils Relative %: 45.9 %
Platelets: 274 10*3/uL (ref 140–400)
RBC: 4.89 10*6/uL (ref 4.20–5.80)
RDW: 12.8 % (ref 11.0–15.0)
Total Lymphocyte: 37.3 %
WBC: 6.9 10*3/uL (ref 3.8–10.8)

## 2020-06-12 LAB — COMPLETE METABOLIC PANEL WITH GFR
AG Ratio: 1.7 (calc) (ref 1.0–2.5)
ALT: 23 U/L (ref 9–46)
AST: 19 U/L (ref 10–35)
Albumin: 4.2 g/dL (ref 3.6–5.1)
Alkaline phosphatase (APISO): 68 U/L (ref 35–144)
BUN: 19 mg/dL (ref 7–25)
CO2: 25 mmol/L (ref 20–32)
Calcium: 9.7 mg/dL (ref 8.6–10.3)
Chloride: 105 mmol/L (ref 98–110)
Creat: 1.01 mg/dL (ref 0.70–1.25)
GFR, Est African American: 88 mL/min/{1.73_m2} (ref >=60)
GFR, Est Non African American: 76 mL/min/{1.73_m2} (ref >=60)
Globulin: 2.5 g/dL (calc) (ref 1.9–3.7)
Glucose, Bld: 100 mg/dL — ABNORMAL HIGH (ref 65–99)
Potassium: 4.2 mmol/L (ref 3.5–5.3)
Sodium: 138 mmol/L (ref 135–146)
Total Bilirubin: 0.6 mg/dL (ref 0.2–1.2)
Total Protein: 6.7 g/dL (ref 6.1–8.1)

## 2020-06-12 LAB — HEMOGLOBIN A1C
Hgb A1c MFr Bld: 5.6 % of total Hgb (ref <5.7)
Mean Plasma Glucose: 114 (calc)
eAG (mmol/L): 6.3 (calc)

## 2020-06-12 LAB — INSULIN, RANDOM: Insulin: 9.9 u[IU]/mL

## 2020-06-12 LAB — LIPID PANEL
Cholesterol: 138 mg/dL (ref <200)
HDL: 55 mg/dL (ref >=40)
LDL Cholesterol (Calc): 64 mg/dL (calc)
Non-HDL Cholesterol (Calc): 83 mg/dL (calc) (ref <130)
Total CHOL/HDL Ratio: 2.5 (calc) (ref <5.0)
Triglycerides: 108 mg/dL (ref <150)

## 2020-06-12 LAB — VITAMIN D 25 HYDROXY (VIT D DEFICIENCY, FRACTURES): Vit D, 25-Hydroxy: 77 ng/mL (ref 30–100)

## 2020-06-12 LAB — MAGNESIUM: Magnesium: 2.1 mg/dL (ref 1.5–2.5)

## 2020-06-12 LAB — TSH: TSH: 1.69 mIU/L (ref 0.40–4.50)

## 2020-06-12 NOTE — Progress Notes (Signed)
==========================================================  -    Total Chol = 138 and LDL Chol = 64 - Both Excellent   - Very low risk for Heart Attack  / Stroke ==========================================================  -  A1c - Normal - Great - No Diabetes ==========================================================  -  Vitamin D = 77 - Excellent  ==========================================================  -  All Else - CBC - Electrolytes -  Liver - Magnesium & Thyroid  - all  Normal / OK ==========================================================

## 2020-06-16 ENCOUNTER — Ambulatory Visit: Payer: PPO | Admitting: Adult Health

## 2020-07-23 ENCOUNTER — Other Ambulatory Visit: Payer: Self-pay | Admitting: Adult Health
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
## 2020-08-21 ENCOUNTER — Encounter: Payer: Self-pay | Admitting: Adult Health

## 2020-08-21 ENCOUNTER — Ambulatory Visit (INDEPENDENT_AMBULATORY_CARE_PROVIDER_SITE_OTHER): Payer: PPO | Admitting: Adult Health

## 2020-08-21 ENCOUNTER — Other Ambulatory Visit: Payer: Self-pay

## 2020-08-21 VITALS — BP 114/68 | HR 66 | Temp 97.2°F | Ht 69.0 in | Wt 187.0 lb

## 2020-08-21 DIAGNOSIS — F418 Other specified anxiety disorders: Secondary | ICD-10-CM

## 2020-08-21 DIAGNOSIS — J45909 Unspecified asthma, uncomplicated: Secondary | ICD-10-CM | POA: Diagnosis not present

## 2020-08-21 DIAGNOSIS — Z85820 Personal history of malignant melanoma of skin: Secondary | ICD-10-CM | POA: Diagnosis not present

## 2020-08-21 DIAGNOSIS — K219 Gastro-esophageal reflux disease without esophagitis: Secondary | ICD-10-CM

## 2020-08-21 DIAGNOSIS — R6889 Other general symptoms and signs: Secondary | ICD-10-CM | POA: Diagnosis not present

## 2020-08-21 DIAGNOSIS — Z0001 Encounter for general adult medical examination with abnormal findings: Secondary | ICD-10-CM

## 2020-08-21 DIAGNOSIS — E785 Hyperlipidemia, unspecified: Secondary | ICD-10-CM | POA: Diagnosis not present

## 2020-08-21 DIAGNOSIS — R7303 Prediabetes: Secondary | ICD-10-CM

## 2020-08-21 DIAGNOSIS — E559 Vitamin D deficiency, unspecified: Secondary | ICD-10-CM | POA: Diagnosis not present

## 2020-08-21 DIAGNOSIS — Z0000 Encounter for general adult medical examination without abnormal findings: Secondary | ICD-10-CM

## 2020-08-21 DIAGNOSIS — I10 Essential (primary) hypertension: Secondary | ICD-10-CM | POA: Diagnosis not present

## 2020-08-21 DIAGNOSIS — E782 Mixed hyperlipidemia: Secondary | ICD-10-CM

## 2020-08-21 DIAGNOSIS — Z6827 Body mass index (BMI) 27.0-27.9, adult: Secondary | ICD-10-CM

## 2020-08-21 DIAGNOSIS — H9193 Unspecified hearing loss, bilateral: Secondary | ICD-10-CM

## 2020-08-21 DIAGNOSIS — T7840XD Allergy, unspecified, subsequent encounter: Secondary | ICD-10-CM

## 2020-08-21 DIAGNOSIS — R001 Bradycardia, unspecified: Secondary | ICD-10-CM

## 2020-08-21 MED ORDER — ATORVASTATIN CALCIUM 10 MG PO TABS: ORAL_TABLET | ORAL | 3 refills | Status: DC

## 2020-08-21 NOTE — Patient Instructions (Addendum)
  Barry Flynn , Thank you for taking time to come for your Medicare Wellness Visit. I appreciate your ongoing commitment to your health goals. Please review the following plan we discussed and let me know if I can assist you in the future.   These are the goals we discussed: Goals    .  DIET - INCREASE WATER INTAKE      65-80+ fluid ounces    .  Weight (lb) < 175 lb (79.4 kg) (pt-stated)       This is a list of the screening recommended for you and due dates:  Health Maintenance  Topic Date Due  . Flu Shot  09/19/2020*  . Colon Cancer Screening  10/07/2022  . Tetanus Vaccine  07/20/2026  . COVID-19 Vaccine  Completed  .  Hepatitis C: One time screening is recommended by Center for Disease Control  (CDC) for  adults born from [birth year] through 1964.   Completed  . Pneumonia vaccines  Completed  *Topic was postponed. The date shown is not the original due date.     Please bring paperwork showing daughter is your medical power of attorney

## 2020-08-22 LAB — COMPLETE METABOLIC PANEL WITH GFR
AG Ratio: 1.6 (calc) (ref 1.0–2.5)
ALT: 28 U/L (ref 9–46)
AST: 23 U/L (ref 10–35)
Albumin: 4.3 g/dL (ref 3.6–5.1)
Alkaline phosphatase (APISO): 74 U/L (ref 35–144)
BUN: 19 mg/dL (ref 7–25)
CO2: 26 mmol/L (ref 20–32)
Calcium: 9.9 mg/dL (ref 8.6–10.3)
Chloride: 105 mmol/L (ref 98–110)
Creat: 1.07 mg/dL (ref 0.70–1.25)
GFR, Est African American: 82 mL/min/{1.73_m2} (ref >=60)
GFR, Est Non African American: 71 mL/min/{1.73_m2} (ref >=60)
Globulin: 2.7 g/dL (calc) (ref 1.9–3.7)
Glucose, Bld: 88 mg/dL (ref 65–99)
Potassium: 4.3 mmol/L (ref 3.5–5.3)
Sodium: 139 mmol/L (ref 135–146)
Total Bilirubin: 0.7 mg/dL (ref 0.2–1.2)
Total Protein: 7 g/dL (ref 6.1–8.1)

## 2020-08-22 LAB — CBC WITH DIFFERENTIAL/PLATELET
Absolute Monocytes: 702 cells/uL (ref 200–950)
Basophils Absolute: 62 cells/uL (ref 0–200)
Basophils Relative: 0.8 %
Eosinophils Absolute: 312 cells/uL (ref 15–500)
Eosinophils Relative: 4 %
HCT: 45.3 % (ref 38.5–50.0)
Hemoglobin: 15.8 g/dL (ref 13.2–17.1)
Lymphs Abs: 2067 cells/uL (ref 850–3900)
MCH: 32.2 pg (ref 27.0–33.0)
MCHC: 34.9 g/dL (ref 32.0–36.0)
MCV: 92.3 fL (ref 80.0–100.0)
MPV: 10.3 fL (ref 7.5–12.5)
Monocytes Relative: 9 %
Neutro Abs: 4657 cells/uL (ref 1500–7800)
Neutrophils Relative %: 59.7 %
Platelets: 316 10*3/uL (ref 140–400)
RBC: 4.91 10*6/uL (ref 4.20–5.80)
RDW: 12.7 % (ref 11.0–15.0)
Total Lymphocyte: 26.5 %
WBC: 7.8 10*3/uL (ref 3.8–10.8)

## 2020-08-22 LAB — LIPID PANEL
Cholesterol: 146 mg/dL (ref <200)
HDL: 53 mg/dL (ref >=40)
LDL Cholesterol (Calc): 74 mg/dL (calc)
Non-HDL Cholesterol (Calc): 93 mg/dL (calc) (ref <130)
Total CHOL/HDL Ratio: 2.8 (calc) (ref <5.0)
Triglycerides: 104 mg/dL (ref <150)

## 2020-08-22 LAB — TSH: TSH: 1.76 mIU/L (ref 0.40–4.50)

## 2020-08-22 LAB — MAGNESIUM: Magnesium: 2.2 mg/dL (ref 1.5–2.5)

## 2020-09-05 ENCOUNTER — Other Ambulatory Visit: Payer: Self-pay | Admitting: Internal Medicine

## 2020-09-05 DIAGNOSIS — I10 Essential (primary) hypertension: Secondary | ICD-10-CM

## 2020-09-29 ENCOUNTER — Other Ambulatory Visit: Payer: Self-pay | Admitting: Internal Medicine

## 2020-09-29 DIAGNOSIS — E782 Mixed hyperlipidemia: Secondary | ICD-10-CM

## 2020-10-14 ENCOUNTER — Other Ambulatory Visit: Payer: Self-pay

## 2020-10-14 ENCOUNTER — Ambulatory Visit (INDEPENDENT_AMBULATORY_CARE_PROVIDER_SITE_OTHER): Payer: PPO | Admitting: *Deleted

## 2020-10-14 VITALS — Temp 96.7°F

## 2020-10-14 DIAGNOSIS — Z23 Encounter for immunization: Secondary | ICD-10-CM | POA: Diagnosis not present

## 2020-10-16 ENCOUNTER — Ambulatory Visit: Payer: PPO | Attending: Internal Medicine

## 2020-10-16 DIAGNOSIS — Z23 Encounter for immunization: Secondary | ICD-10-CM

## 2020-10-16 NOTE — Progress Notes (Signed)
   Covid-19 Vaccination Clinic  Name:  SEVERIN BOU    MRN: [id number] DOB: [birth year]  10/16/2020  Mr. Jenny was observed post Covid-19 immunization for 15 minutes without incident. He was provided with Vaccine Information Sheet and instruction to access the V-Safe system.   Mr. Alfrey was instructed to call 911 with any severe reactions post vaccine: Marland Kitchen Difficulty breathing  . Swelling of face and throat  . A fast heartbeat  . A bad rash all over body  . Dizziness and weakness

## 2020-10-21 ENCOUNTER — Encounter: Payer: PPO | Admitting: Internal Medicine

## 2020-10-23 ENCOUNTER — Encounter: Payer: PPO | Admitting: Internal Medicine

## 2020-11-30 ENCOUNTER — Other Ambulatory Visit: Payer: Self-pay | Admitting: Internal Medicine

## 2020-11-30 DIAGNOSIS — I10 Essential (primary) hypertension: Secondary | ICD-10-CM

## 2020-12-01 ENCOUNTER — Encounter: Payer: Self-pay | Admitting: Internal Medicine

## 2020-12-01 NOTE — Patient Instructions (Signed)

## 2020-12-01 NOTE — Progress Notes (Signed)
Annual  Screening/Preventative Visit  & Comprehensive Evaluation & Examination      This very nice 69 y.o.  MWM presents for a Screening /Preventative Visit & comprehensive evaluation and management of multiple medical co-morbidities.  Patient has been followed for HTN, HLD, Prediabetes and Vitamin D Deficiency. Patient has GERD controlled with diet & his Prilosec.      HTN predates since  1995. Patient's BP has been controlled at home.  Today's BP is at goal -  126/82. Patient denies any cardiac symptoms as chest pain, palpitations, shortness of breath, dizziness or ankle swelling.      Patient's hyperlipidemia is controlled with diet and Atorvastatin. Patient denies myalgias or other medication SE's. Last lipids were at goal:  Lab Results  Component Value Date   CHOL 146 08/21/2020   HDL 53 08/21/2020   LDLCALC 74 08/21/2020   TRIG 104 08/21/2020   CHOLHDL 2.8 08/21/2020        Patient has hx/o prediabetes (A1c 6.0% /2012) and patient denies reactive hypoglycemic symptoms, visual blurring, diabetic polys or paresthesias. Last A1c was normal & at goal:   Lab Results  Component Value Date   HGBA1C 5.6 06/11/2020        Finally, patient has history of Vitamin D Deficiency ("34" /2012) and last vitamin D was at goal:   Lab Results  Component Value Date   VD25OH 77 06/11/2020    Current Outpatient Medications on File Prior to Visit  Medication Sig  . ALPRAZolam  0.5 MG tablet Take 1/2 to 1 tablet 3 x /day as needed   . atorvastatin  10 MG tablet Take     1 tablet     Daily     for Cholesterol  . OPTIVAR ophth  soln 1 drop into both eyes 2  times daily as needed.  Marland Kitchen VITAMIN D  5,000 Units  Takedaily.  Marland Kitchen Fexofenadine  Take by mouth as needed.   Marland Kitchen FLONASE nasal spray 2 SPRAYS EACH  NOSTRIL DAILY  . ibuprofen  800 MG tablet Take  every 8  hours as needed.  Marland Kitchen olmesartan 40 MG tablet Take     1 tablet      Daily      for BP  . omeprazole  20 MG capsule TAKE 1 CAP  EVERY DAY   . zinc  50 MG tablet Take daily.    Allergies  Allergen Reactions  . Hismanal [Astemizole]   . Latex   . Penicillins Hives    Past Medical History:  Diagnosis Date  . Allergy   . 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 Cataract    both eyes no surgery needed  . Chronic kidney disease    kidney stones  . GERD (gastroesophageal reflux disease)   . Hyperlipidemia   . Hypertension    white coat syndrome  . Prediabetes   . Vitamin D deficiency    Health Maintenance  Topic Date Due  . COLONOSCOPY  10/07/2022  . TETANUS/TDAP  07/20/2026  . INFLUENZA VACCINE  Completed  . COVID-19 Vaccine  Completed  . Hepatitis C Screening  Completed  . PNA vac Low Risk Adult  Completed   Immunization History  Administered Date(s) Administered  . Influenza, High Dose Seasonal PF 10/12/2017, 10/13/2018, 10/17/2019, 10/14/2020  . Influenza-Unspecified 10/23/2015  . Moderna Sars-Covid-2 Vaccination 01/24/2020, 02/21/2020, 10/16/2020  . Pneumococcal Conjugate-13 07/25/2017  . Pneumococcal Polysaccharide-23 06/10/2011, 07/26/2018  . Td  04/14/2006  . Tdap 07/20/2016  . Zoster 01/16/2013   Last Colon - 10/07/2017 - Dr Henrene Pastor  - Recc 5 yr f/u due Oct 2023  Past Surgical History:  Procedure Laterality Date  . BASAL CELL CARCINOMA EXCISION    . COLONOSCOPY    . ELBOW ARTHROSCOPY WITH TENDON RECONSTRUCTION Left 2013  . GUM SURGERY Left 2017   bone graft, root canal left side  . KNEE ARTHROSCOPY Right 1982, 1999  . melanoma excision     cheek lt.  . SPLENECTOMY     Family History  Problem Relation Age of Onset  . Alzheimer's disease Mother   . Heart disease Father   . Dementia Father   . Colon polyps Brother   . Colon cancer Neg Hx   . Esophageal cancer Neg Hx   . Rectal cancer Neg Hx   . Stomach cancer Neg Hx    Social History   Socioeconomic History  . Marital status: Married    Spouse name: Jana Half  . Number of children: Not on file  Occupational  History  . 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 Smoking status: Never Smoker  . Smokeless tobacco: Never Used  Substance and Sexual Activity  . Alcohol use: Yes    Alcohol/week: 5.0 standard drinks    Types: 5 Standard drinks or equivalent per week    Comment: Wine  . Drug use: No  . Sexual activity: Not on file    ROS Constitutional: Denies fever, chills, weight loss/gain, headaches, insomnia,  night sweats or change in appetite. Does c/o fatigue. Eyes: Denies redness, blurred vision, diplopia, discharge, itchy or watery eyes.  ENT: Denies discharge, congestion, post nasal drip, epistaxis, sore throat, earache, hearing loss, dental pain, Tinnitus, Vertigo, Sinus pain or snoring.  Cardio: Denies chest pain, palpitations, irregular heartbeat, syncope, dyspnea, diaphoresis, orthopnea, PND, claudication or edema Respiratory: denies cough, dyspnea, DOE, pleurisy, hoarseness, laryngitis or wheezing.  Gastrointestinal: Denies dysphagia, heartburn, reflux, water brash, pain, cramps, nausea, vomiting, bloating, diarrhea, constipation, hematemesis, melena, hematochezia, jaundice or hemorrhoids Genitourinary: Denies dysuria, frequency, urgency, nocturia, hesitancy, discharge, hematuria or flank pain Musculoskeletal: Denies arthralgia, myalgia, stiffness, Jt. Swelling, pain, limp or strain/sprain. Denies Falls. Skin: Denies puritis, rash, hives, warts, acne, eczema or change in skin lesion Neuro: No weakness, tremor, incoordination, spasms, paresthesia or pain Psychiatric: Denies confusion, memory loss or sensory loss. Denies Depression. Endocrine: Denies change in weight, skin, hair change, nocturia, and paresthesia, diabetic polys, visual blurring or hyper / hypo glycemic episodes.  Heme/Lymph: No excessive bleeding, bruising or enlarged lymph nodes.  Physical Exam  BP 126/82   Pulse 62   Temp (!) 97.5 F (36.4 C)   Ht '5\' 8"'  (1.727 m)   Wt 191 lb 3.2 oz  (86.7 kg)   SpO2 98%   BMI 29.07 kg/m   General Appearance: Well nourished and well groomed and in no apparent distress.  Eyes: PERRLA, EOMs, conjunctiva no swelling or erythema, normal fundi and vessels. Sinuses: No frontal/maxillary tenderness ENT/Mouth: EACs patent / TMs  nl. Nares clear without erythema, swelling, mucoid exudates. Oral hygiene is good. No erythema, swelling, or exudate. Tongue normal, non-obstructing. Tonsils not swollen or erythematous. Hearing normal.  Neck: Supple, thyroid not palpable. No bruits, nodes or JVD. Respiratory: Respiratory effort normal.  BS equal and clear bilateral without rales, rhonci, wheezing or stridor. Cardio: Heart sounds are normal with regular rate and rhythm and no murmurs, rubs or gallops. Peripheral  pulses are normal and equal bilaterally without edema. No aortic or femoral bruits. Chest: symmetric with normal excursions and percussion.  Abdomen: Soft, with Nl bowel sounds. Nontender, no guarding, rebound, hernias, masses, or organomegaly.  Lymphatics: Non tender without lymphadenopathy.  Musculoskeletal: Full ROM all peripheral extremities, joint stability, 5/5 strength, and normal gait. Skin: Warm and dry without rashes, lesions, cyanosis, clubbing or  ecchymosis.  Neuro: Cranial nerves intact, reflexes equal bilaterally. Normal muscle tone, no cerebellar symptoms. Sensation intact.  Pysch: Alert and oriented x 3 with normal affect, insight and judgment appropriate.   Assessment and Plan  1. Annual Preventative/Screening Exam    2. Essential hypertension  - EKG 12-Lead - [place], RETROPERITNL ABD,  LTD - Urinalysis, Routine w reflex microscopic - Microalbumin / creatinine urine ratio - CBC with Differential/Platelet - COMPLETE METABOLIC PANEL WITH GFR - Magnesium - TSH  3. Hyperlipidemia, mixed  - EKG 12-Lead - [place], RETROPERITNL ABD,  LTD - Lipid panel - TSH  4. Abnormal glucose  - EKG 12-Lead - [place], RETROPERITNL ABD,   LTD - Hemoglobin A1c - Insulin, random  5. Vitamin D deficiency  - VITAMIN D 25 Hydroxy   6. Gastroesophageal reflux disease  - CBC with Differential/Platelet  7. BPH with obstruction/lower urinary tract symptoms  - PSA  8. Prostate cancer screening  - PSA  9. Screening for colorectal cancer  - POC Hemoccult Bld/Stl   10. Screening for ischemic heart disease  - EKG 12-Lead  11. FHx: heart disease  - EKG 12-Lead - [place], RETROPERITNL ABD,  LTD  12. Screening for AAA (aortic abdominal aneurysm)  - [place], RETROPERITNL ABD,  LTD  13. Medication management  - Urinalysis, Routine w reflex microscopic - Microalbumin / creatinine urine ratio - CBC with Differential/Platelet - COMPLETE METABOLIC PANEL WITH GFR - Magnesium - Lipid panel - TSH - Hemoglobin A1c - Insulin, random - VITAMIN D 25 Hydroxy         Patient was counseled in prudent diet, weight control to achieve/maintain BMI less than 25, BP monitoring, regular exercise and medications as discussed.  Discussed med effects and SE's. Routine screening labs and tests as requested with regular follow-up as recommended. Over 40 minutes of exam, counseling, chart review and high complex critical decision making was performed   Kirtland Bouchard, MD

## 2020-12-02 ENCOUNTER — Encounter: Payer: Self-pay | Admitting: Internal Medicine

## 2020-12-02 ENCOUNTER — Other Ambulatory Visit: Payer: Self-pay

## 2020-12-02 ENCOUNTER — Ambulatory Visit (INDEPENDENT_AMBULATORY_CARE_PROVIDER_SITE_OTHER): Payer: PPO | Admitting: Internal Medicine

## 2020-12-02 VITALS — BP 126/82 | HR 62 | Temp 97.5°F | Ht 68.0 in | Wt 191.2 lb

## 2020-12-02 DIAGNOSIS — Z79899 Other long term (current) drug therapy: Secondary | ICD-10-CM | POA: Diagnosis not present

## 2020-12-02 DIAGNOSIS — E559 Vitamin D deficiency, unspecified: Secondary | ICD-10-CM | POA: Diagnosis not present

## 2020-12-02 DIAGNOSIS — N138 Other obstructive and reflux uropathy: Secondary | ICD-10-CM

## 2020-12-02 DIAGNOSIS — Z125 Encounter for screening for malignant neoplasm of prostate: Secondary | ICD-10-CM

## 2020-12-02 DIAGNOSIS — Z136 Encounter for screening for cardiovascular disorders: Secondary | ICD-10-CM

## 2020-12-02 DIAGNOSIS — Z1212 Encounter for screening for malignant neoplasm of rectum: Secondary | ICD-10-CM

## 2020-12-02 DIAGNOSIS — Z0000 Encounter for general adult medical examination without abnormal findings: Secondary | ICD-10-CM

## 2020-12-02 DIAGNOSIS — R7309 Other abnormal glucose: Secondary | ICD-10-CM

## 2020-12-02 DIAGNOSIS — K219 Gastro-esophageal reflux disease without esophagitis: Secondary | ICD-10-CM | POA: Diagnosis not present

## 2020-12-02 DIAGNOSIS — Z8249 Family history of ischemic heart disease and other diseases of the circulatory system: Secondary | ICD-10-CM | POA: Diagnosis not present

## 2020-12-02 DIAGNOSIS — I10 Essential (primary) hypertension: Secondary | ICD-10-CM | POA: Diagnosis not present

## 2020-12-02 DIAGNOSIS — E782 Mixed hyperlipidemia: Secondary | ICD-10-CM | POA: Diagnosis not present

## 2020-12-02 DIAGNOSIS — Z0001 Encounter for general adult medical examination with abnormal findings: Secondary | ICD-10-CM

## 2020-12-02 DIAGNOSIS — R7303 Prediabetes: Secondary | ICD-10-CM

## 2020-12-02 DIAGNOSIS — N401 Enlarged prostate with lower urinary tract symptoms: Secondary | ICD-10-CM | POA: Diagnosis not present

## 2020-12-03 LAB — CBC WITH DIFFERENTIAL/PLATELET
Absolute Monocytes: 592 cells/uL (ref 200–950)
Basophils Absolute: 63 cells/uL (ref 0–200)
Basophils Relative: 1 %
Eosinophils Absolute: 239 cells/uL (ref 15–500)
Eosinophils Relative: 3.8 %
HCT: 45.9 % (ref 38.5–50.0)
Hemoglobin: 16.2 g/dL (ref 13.2–17.1)
Lymphs Abs: 2048 cells/uL (ref 850–3900)
MCH: 32 pg (ref 27.0–33.0)
MCHC: 35.3 g/dL (ref 32.0–36.0)
MCV: 90.5 fL (ref 80.0–100.0)
MPV: 10 fL (ref 7.5–12.5)
Monocytes Relative: 9.4 %
Neutro Abs: 3358 cells/uL (ref 1500–7800)
Neutrophils Relative %: 53.3 %
Platelets: 314 10*3/uL (ref 140–400)
RBC: 5.07 10*6/uL (ref 4.20–5.80)
RDW: 12.7 % (ref 11.0–15.0)
Total Lymphocyte: 32.5 %
WBC: 6.3 10*3/uL (ref 3.8–10.8)

## 2020-12-03 LAB — HEMOGLOBIN A1C
Hgb A1c MFr Bld: 6 % of total Hgb — ABNORMAL HIGH (ref <5.7)
Mean Plasma Glucose: 126 mg/dL
eAG (mmol/L): 7 mmol/L

## 2020-12-03 LAB — URINALYSIS, ROUTINE W REFLEX MICROSCOPIC
Bacteria, UA: NONE SEEN /HPF
Bilirubin Urine: NEGATIVE
Glucose, UA: NEGATIVE
Hgb urine dipstick: NEGATIVE
Hyaline Cast: NONE SEEN /LPF
Ketones, ur: NEGATIVE
Nitrite: NEGATIVE
Protein, ur: NEGATIVE
Specific Gravity, Urine: 1.014 (ref 1.001–1.03)
Squamous Epithelial / HPF: NONE SEEN /HPF (ref <=5)
WBC, UA: NONE SEEN /HPF (ref 0–5)
pH: 7.5 (ref 5.0–8.0)

## 2020-12-03 LAB — COMPLETE METABOLIC PANEL WITH GFR
AG Ratio: 1.8 (calc) (ref 1.0–2.5)
ALT: 26 U/L (ref 9–46)
AST: 23 U/L (ref 10–35)
Albumin: 4.2 g/dL (ref 3.6–5.1)
Alkaline phosphatase (APISO): 69 U/L (ref 35–144)
BUN: 15 mg/dL (ref 7–25)
CO2: 28 mmol/L (ref 20–32)
Calcium: 9.8 mg/dL (ref 8.6–10.3)
Chloride: 103 mmol/L (ref 98–110)
Creat: 1.06 mg/dL (ref 0.70–1.25)
GFR, Est African American: 83 mL/min/{1.73_m2} (ref >=60)
GFR, Est Non African American: 72 mL/min/{1.73_m2} (ref >=60)
Globulin: 2.4 g/dL (calc) (ref 1.9–3.7)
Glucose, Bld: 90 mg/dL (ref 65–99)
Potassium: 4.6 mmol/L (ref 3.5–5.3)
Sodium: 138 mmol/L (ref 135–146)
Total Bilirubin: 0.7 mg/dL (ref 0.2–1.2)
Total Protein: 6.6 g/dL (ref 6.1–8.1)

## 2020-12-03 LAB — MICROALBUMIN / CREATININE URINE RATIO
Creatinine, Urine: 116 mg/dL (ref 20–320)
Microalb Creat Ratio: 7 mcg/mg creat (ref <30)
Microalb, Ur: 0.8 mg/dL

## 2020-12-03 LAB — PSA: PSA: 0.6 ng/mL (ref <=4.0)

## 2020-12-03 LAB — LIPID PANEL
Cholesterol: 151 mg/dL (ref <200)
HDL: 54 mg/dL (ref >=40)
LDL Cholesterol (Calc): 80 mg/dL (calc)
Non-HDL Cholesterol (Calc): 97 mg/dL (calc) (ref <130)
Total CHOL/HDL Ratio: 2.8 (calc) (ref <5.0)
Triglycerides: 88 mg/dL (ref <150)

## 2020-12-03 LAB — VITAMIN D 25 HYDROXY (VIT D DEFICIENCY, FRACTURES): Vit D, 25-Hydroxy: 79 ng/mL (ref 30–100)

## 2020-12-03 LAB — MAGNESIUM: Magnesium: 2.2 mg/dL (ref 1.5–2.5)

## 2020-12-03 LAB — INSULIN, RANDOM: Insulin: 7 u[IU]/mL

## 2020-12-03 LAB — TSH: TSH: 2.45 mIU/L (ref 0.40–4.50)

## 2020-12-03 NOTE — Progress Notes (Signed)
========================================================== -   Test results slightly outside the reference range are not unusual. If there is anything important, I will review this with you,  otherwise it is considered normal test values.  If you have further questions,  please do not hesitate to contact me at the office or via My Chart.  ==========================================================  -  PSA - Low - Great ! ==========================================================  -  Total Chol = 151 and LDL Chol = 74 - Both  - Excellent  ! ==========================================================  -  A1c - Worse - Gone up from 5.6% to now 6.0%   Blood sugar and is   elevated in the borderline and early or pre-diabetes range which  has the same 300% increased risk for heart attack, stroke, cancer and   Alzheimer- type vascular dementia as full blown Diabetes.   But the good news is that diet, exercise with  weight loss can cure the early diabetes at this point.    -   It is very important that you work harder with diet by  avoiding all foods that are white except chicken,  fish & calliflower.  - Avoid white rice  (brown & wild rice is OK),   - Avoid white potatoes  (sweet potatoes in moderation is OK),   White bread or wheat bread or anything made out of   white flour like bagels, donuts, rolls, buns, biscuits, cakes,  - pastries, cookies, pizza crust, and pasta (made from  white flour & egg whites)   - vegetarian pasta or spinach or wheat pasta is OK.  - Multigrain breads like Arnold's, Pepperidge Farm or   multigrain sandwich thins or high fiber breads like   [place] bread or "Dave's Killer" breads that are  4 to 5 grams fiber per slice !  are best.    Diet, exercise and weight loss can reverse and cure  diabetes in the early stages.   ==========================================================  -  Vitamin D = 79 - Excellent  ! ! ! -   - Vitamin D goal is  between 70-100.   - It is very important as a natural anti-inflammatory and helping the  immune system protect against viral infections, like the Covid-19    helping hair, skin, and nails, as well as reducing stroke and  heart attack risk.   - It helps your bones and helps with mood.  - It also decreases numerous cancer risks so please  take it as directed.   - Low Vit D is associated with a 200-300% higher risk for  CANCER   and 200-300% higher risk for HEART   ATTACK  &  STROKE.    - It is also associated with higher death rate at younger ages,   autoimmune diseases like Rheumatoid arthritis, Lupus,  Multiple Sclerosis.     - Also many other serious conditions, like depression, Alzheimer's  Dementia, infertility, muscle aches, fatigue, fibromyalgia   - just to name a few. ==========================================================  - All Else - CBC - Kidneys - Electrolytes - Liver - Magnesium & Thyroid    - all  Normal / OK ==========================================================   - Keep up the Great Work & have  a great holiday  ! ==========================================================

## 2020-12-21 ENCOUNTER — Other Ambulatory Visit: Payer: Self-pay | Admitting: Internal Medicine

## 2020-12-21 DIAGNOSIS — Z20822 Contact with and (suspected) exposure to covid-19: Secondary | ICD-10-CM

## 2020-12-21 MED ORDER — BENZONATATE 200 MG PO CAPS: ORAL_CAPSULE | ORAL | 1 refills | Status: DC

## 2020-12-21 MED ORDER — PROMETHAZINE-CODEINE 6.25-10 MG/5ML PO SYRP: ORAL_SOLUTION | ORAL | 1 refills | Status: DC

## 2020-12-23 ENCOUNTER — Other Ambulatory Visit: Payer: Self-pay | Admitting: Internal Medicine

## 2020-12-23 DIAGNOSIS — E782 Mixed hyperlipidemia: Secondary | ICD-10-CM

## 2021-01-13 ENCOUNTER — Other Ambulatory Visit: Payer: Self-pay | Admitting: Adult Health

## 2021-01-13 ENCOUNTER — Other Ambulatory Visit: Payer: Self-pay

## 2021-01-13 DIAGNOSIS — Z1211 Encounter for screening for malignant neoplasm of colon: Secondary | ICD-10-CM

## 2021-01-13 DIAGNOSIS — Z1212 Encounter for screening for malignant neoplasm of rectum: Secondary | ICD-10-CM

## 2021-01-13 LAB — POC HEMOCCULT BLD/STL (HOME/3-CARD/SCREEN)
Card #2 Fecal Occult Blod, POC: NEGATIVE
Card #3 Fecal Occult Blood, POC: NEGATIVE
Fecal Occult Blood, POC: NEGATIVE

## 2021-01-14 DIAGNOSIS — Z1211 Encounter for screening for malignant neoplasm of colon: Secondary | ICD-10-CM | POA: Diagnosis not present

## 2021-01-14 DIAGNOSIS — Z1212 Encounter for screening for malignant neoplasm of rectum: Secondary | ICD-10-CM | POA: Diagnosis not present

## 2021-02-12 DIAGNOSIS — Z85820 Personal history of malignant melanoma of skin: Secondary | ICD-10-CM | POA: Diagnosis not present

## 2021-02-12 DIAGNOSIS — L821 Other seborrheic keratosis: Secondary | ICD-10-CM | POA: Diagnosis not present

## 2021-02-12 DIAGNOSIS — L814 Other melanin hyperpigmentation: Secondary | ICD-10-CM | POA: Diagnosis not present

## 2021-02-12 DIAGNOSIS — L578 Other skin changes due to chronic exposure to nonionizing radiation: Secondary | ICD-10-CM | POA: Diagnosis not present

## 2021-02-12 DIAGNOSIS — Z86018 Personal history of other benign neoplasm: Secondary | ICD-10-CM | POA: Diagnosis not present

## 2021-02-12 DIAGNOSIS — L719 Rosacea, unspecified: Secondary | ICD-10-CM | POA: Diagnosis not present

## 2021-02-12 DIAGNOSIS — Z85828 Personal history of other malignant neoplasm of skin: Secondary | ICD-10-CM | POA: Diagnosis not present

## 2021-02-12 DIAGNOSIS — D225 Melanocytic nevi of trunk: Secondary | ICD-10-CM | POA: Diagnosis not present

## 2021-02-22 ENCOUNTER — Other Ambulatory Visit: Payer: Self-pay | Admitting: Internal Medicine

## 2021-02-22 DIAGNOSIS — I10 Essential (primary) hypertension: Secondary | ICD-10-CM

## 2021-03-16 ENCOUNTER — Other Ambulatory Visit: Payer: Self-pay | Admitting: Adult Health

## 2021-03-16 DIAGNOSIS — E782 Mixed hyperlipidemia: Secondary | ICD-10-CM
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
## 2021-03-18 ENCOUNTER — Other Ambulatory Visit: Payer: Self-pay

## 2021-03-18 ENCOUNTER — Encounter: Payer: Self-pay | Admitting: Adult Health

## 2021-03-18 ENCOUNTER — Ambulatory Visit (INDEPENDENT_AMBULATORY_CARE_PROVIDER_SITE_OTHER): Payer: PPO | Admitting: Adult Health

## 2021-03-18 VITALS — BP 138/78 | HR 57 | Temp 97.3°F | Wt 194.0 lb

## 2021-03-18 DIAGNOSIS — Z0001 Encounter for general adult medical examination with abnormal findings: Secondary | ICD-10-CM

## 2021-03-18 DIAGNOSIS — E559 Vitamin D deficiency, unspecified: Secondary | ICD-10-CM

## 2021-03-18 DIAGNOSIS — Z0000 Encounter for general adult medical examination without abnormal findings: Secondary | ICD-10-CM

## 2021-03-18 DIAGNOSIS — R001 Bradycardia, unspecified: Secondary | ICD-10-CM

## 2021-03-18 DIAGNOSIS — E663 Overweight: Secondary | ICD-10-CM

## 2021-03-18 DIAGNOSIS — I10 Essential (primary) hypertension: Secondary | ICD-10-CM

## 2021-03-18 DIAGNOSIS — R7303 Prediabetes: Secondary | ICD-10-CM | POA: Diagnosis not present

## 2021-03-18 DIAGNOSIS — H9193 Unspecified hearing loss, bilateral: Secondary | ICD-10-CM | POA: Diagnosis not present

## 2021-03-18 DIAGNOSIS — Z85820 Personal history of malignant melanoma of skin: Secondary | ICD-10-CM

## 2021-03-18 DIAGNOSIS — E785 Hyperlipidemia, unspecified: Secondary | ICD-10-CM

## 2021-03-18 DIAGNOSIS — R6889 Other general symptoms and signs: Secondary | ICD-10-CM

## 2021-03-18 DIAGNOSIS — K219 Gastro-esophageal reflux disease without esophagitis: Secondary | ICD-10-CM | POA: Diagnosis not present

## 2021-03-18 DIAGNOSIS — F418 Other specified anxiety disorders: Secondary | ICD-10-CM

## 2021-03-18 DIAGNOSIS — E782 Mixed hyperlipidemia: Secondary | ICD-10-CM

## 2021-03-18 DIAGNOSIS — J45909 Unspecified asthma, uncomplicated: Secondary | ICD-10-CM | POA: Diagnosis not present

## 2021-03-18 DIAGNOSIS — T7840XD Allergy, unspecified, subsequent encounter: Secondary | ICD-10-CM

## 2021-03-18 MED ORDER — PREDNISOLONE ACETATE 1 % OP SUSP: OPHTHALMIC | 0 refills | Status: DC

## 2021-03-18 MED ORDER — ATORVASTATIN CALCIUM 10 MG PO TABS
ORAL_TABLET | ORAL | 3 refills | Status: DC
Start: 2021-03-18 — End: 2022-03-25

## 2021-03-18 NOTE — Patient Instructions (Addendum)
Barry Flynn , Thank you for taking time to come for your Medicare Wellness Visit. I appreciate your ongoing commitment to your health goals. Please review the following plan we discussed and let me know if I can assist you in the future.   These are the goals we discussed: Goals    .  DIET - INCREASE WATER INTAKE      65-80+ fluid ounces    .  Weight (lb) < 180 lb (81.6 kg) (pt-stated)       This is a list of the screening recommended for you and due dates:  Health Maintenance  Topic Date Due  . Colon Cancer Screening  10/07/2022  . Tetanus Vaccine  07/20/2026  . Flu Shot  Completed  . COVID-19 Vaccine  Completed  .  Hepatitis C: One time screening is recommended by Center for Disease Control  (CDC) for  adults born from [birth year] through 1964.   Completed  . Pneumonia vaccines  Completed  . HPV Vaccine  Aged Out       Shingrix - check with insurance if they cover - can get at [facility]    High-Fiber Eating Plan Fiber, also called dietary fiber, is a type of carbohydrate. It is found foods such as fruits, vegetables, whole grains, and beans. A high-fiber diet can have many health benefits. Your health care provider may recommend a high-fiber diet to help:  Prevent constipation. Fiber can make your bowel movements more regular.  Lower your cholesterol.  Relieve the following conditions: ? Inflammation of veins in the anus (hemorrhoids). ? Inflammation of specific areas of the digestive tract (uncomplicated diverticulosis). ? A problem of the large intestine, also called the colon, that sometimes causes pain and diarrhea (irritable bowel syndrome, or IBS).  Prevent overeating as part of a weight-loss plan.  Prevent heart disease, type 2 diabetes, and certain cancers. What are tips for following this plan? Reading food labels  Check the nutrition facts label on food products for the amount of dietary fiber. Choose foods that have 5 grams of fiber or more per serving.  The goals for  recommended daily fiber intake include: ? Men (age 68 or younger): 34-38 g. ? Men (over age 26): 28-34 g. ? Women (age 56 or younger): 25-28 g. ? Women (over age 41): 22-25 g. Your daily fiber goal is _____________ g.   Shopping  Choose whole fruits and vegetables instead of processed forms, such as apple juice or applesauce.  Choose a wide variety of high-fiber foods such as avocados, lentils, oats, and kidney beans.  Read the nutrition facts label of the foods you choose. Be aware of foods with added fiber. These foods often have high sugar and sodium amounts per serving. Cooking  Use whole-grain flour for baking and cooking.  Cook with brown rice instead of white rice. Meal planning  Start the day with a breakfast that is high in fiber, such as a cereal that contains 5 g of fiber or more per serving.  Eat breads and cereals that are made with whole-grain flour instead of refined flour or white flour.  Eat brown rice, bulgur wheat, or millet instead of white rice.  Use beans in place of meat in soups, salads, and pasta dishes.  Be sure that half of the grains you eat each day are whole grains. General information  You can get the recommended daily intake of dietary fiber by: ? Eating a variety of fruits, vegetables, grains, nuts, and beans. ? Taking  a fiber supplement if you are not able to take in enough fiber in your diet. It is better to get fiber through food than from a supplement.  Gradually increase how much fiber you consume. If you increase your intake of dietary fiber too quickly, you may have bloating, cramping, or gas.  Drink plenty of water to help you digest fiber.  Choose high-fiber snacks, such as berries, raw vegetables, nuts, and popcorn. What foods should I eat? Fruits Berries. Pears. Apples. Oranges. Avocado. Prunes and raisins. Dried figs. Vegetables Sweet potatoes. Spinach. Kale. Artichokes. Cabbage. Broccoli. Cauliflower. Green peas. Carrots.  Squash. Grains Whole-grain breads. Multigrain cereal. Oats and oatmeal. Brown rice. Barley. Bulgur wheat. [place]. Quinoa. Bran muffins. Popcorn. Rye wafer crackers. Meats and other proteins Navy beans, kidney beans, and pinto beans. Soybeans. Split peas. Lentils. Nuts and seeds. Dairy Fiber-fortified yogurt. Beverages Fiber-fortified soy milk. Fiber-fortified orange juice. Other foods Fiber bars. The items listed above may not be a complete list of recommended foods and beverages. Contact a dietitian for more information. What foods should I avoid? Fruits Fruit juice. Cooked, strained fruit. Vegetables Fried potatoes. Canned vegetables. Well-cooked vegetables. Grains White bread. Pasta made with refined flour. White rice. Meats and other proteins Fatty cuts of meat. Fried chicken or fried fish. Dairy Milk. Yogurt. Cream cheese. Sour cream. Fats and oils Butters. Beverages Soft drinks. Other foods Cakes and pastries. The items listed above may not be a complete list of foods and beverages to avoid. Talk with your dietitian about what choices are best for you. Summary  Fiber is a type of carbohydrate. It is found in foods such as fruits, vegetables, whole grains, and beans.  A high-fiber diet has many benefits. It can help to prevent constipation, lower blood cholesterol, aid weight loss, and reduce your risk of heart disease, diabetes, and certain cancers.  Increase your intake of fiber gradually. Increasing fiber too quickly may cause cramping, bloating, and gas. Drink plenty of water while you increase the amount of fiber you consume.  The best sources of fiber include whole fruits and vegetables, whole grains, nuts, seeds, and beans. This information is not intended to replace advice given to you by your health care provider. Make sure you discuss any questions you have with your health care provider. Document Revised: 04/10/2020 Document Reviewed: 04/10/2020 Elsevier  Patient Education  2021 Barry Flynn.     Zoster Vaccine, Recombinant injection What is this medicine? ZOSTER VACCINE (ZOS ter vak SEEN) is a vaccine used to reduce the risk of getting shingles. This vaccine is not used to treat shingles or nerve pain from shingles. This medicine may be used for other purposes; ask your health care provider or pharmacist if you have questions. COMMON BRAND NAME(S): [facility] What should I tell my health care provider before I take this medicine? They need to know if you have any of these conditions:  cancer  immune system problems  an unusual or allergic reaction to Zoster vaccine, other medications, foods, dyes, or preservatives  pregnant or trying to get pregnant  breast-feeding How should I use this medicine? This vaccine is injected into a muscle. It is given by a health care provider. A copy of Vaccine Information Statements will be given before each vaccination. Be sure to read this information carefully each time. This sheet may change often. Talk to your health care provider about the use of this vaccine in children. This vaccine is not approved for use in children. Overdosage: If you think  you have taken too much of this medicine contact a poison control center or emergency room at once. NOTE: This medicine is only for you. Do not share this medicine with others. What if I miss a dose? Keep appointments for follow-up (booster) doses. It is important not to miss your dose. Call your health care provider if you are unable to keep an appointment. What may interact with this medicine?  medicines that suppress your immune system  medicines to treat cancer  steroid medicines like prednisone or cortisone This list may not describe all possible interactions. Give your health care provider a list of all the medicines, herbs, non-prescription drugs, or dietary supplements you use. Also tell them if you smoke, drink alcohol, or use illegal drugs.  Some items may interact with your medicine. What should I watch for while using this medicine? Visit your health care provider regularly. This vaccine, like all vaccines, may not fully protect everyone. What side effects may I notice from receiving this medicine? Side effects that you should report to your doctor or health care professional as soon as possible:  allergic reactions (skin rash, itching or hives; swelling of the face, lips, or tongue)  trouble breathing Side effects that usually do not require medical attention (report these to your doctor or health care professional if they continue or are bothersome):  chills  headache  fever  nausea  pain, redness, or irritation at site where injected  tiredness  vomiting This list may not describe all possible side effects. Call your doctor for medical advice about side effects. You may report side effects to FDA at 1-800-FDA-1088. Where should I keep my medicine? This vaccine is only given by a health care provider. It will not be stored at home. NOTE: This sheet is a summary. It may not cover all possible information. If you have questions about this medicine, talk to your doctor, pharmacist, or health care provider.  2021 Elsevier/Gold Standard (2020-01-11 16:23:07)

## 2021-03-19 LAB — COMPLETE METABOLIC PANEL WITH GFR
AG Ratio: 1.8 (calc) (ref 1.0–2.5)
ALT: 31 U/L (ref 9–46)
AST: 23 U/L (ref 10–35)
Albumin: 4.4 g/dL (ref 3.6–5.1)
Alkaline phosphatase (APISO): 77 U/L (ref 35–144)
BUN: 17 mg/dL (ref 7–25)
CO2: 26 mmol/L (ref 20–32)
Calcium: 10 mg/dL (ref 8.6–10.3)
Chloride: 104 mmol/L (ref 98–110)
Creat: 0.92 mg/dL (ref 0.70–1.25)
GFR, Est African American: 98 mL/min/{1.73_m2} (ref >=60)
GFR, Est Non African American: 85 mL/min/{1.73_m2} (ref >=60)
Globulin: 2.5 g/dL (calc) (ref 1.9–3.7)
Glucose, Bld: 93 mg/dL (ref 65–99)
Potassium: 4.4 mmol/L (ref 3.5–5.3)
Sodium: 138 mmol/L (ref 135–146)
Total Bilirubin: 0.5 mg/dL (ref 0.2–1.2)
Total Protein: 6.9 g/dL (ref 6.1–8.1)

## 2021-03-19 LAB — CBC WITH DIFFERENTIAL/PLATELET
Absolute Monocytes: 732 cells/uL (ref 200–950)
Basophils Absolute: 50 cells/uL (ref 0–200)
Basophils Relative: 0.8 %
Eosinophils Absolute: 298 cells/uL (ref 15–500)
Eosinophils Relative: 4.8 %
HCT: 45.1 % (ref 38.5–50.0)
Hemoglobin: 15.8 g/dL (ref 13.2–17.1)
Lymphs Abs: 2040 cells/uL (ref 850–3900)
MCH: 31.7 pg (ref 27.0–33.0)
MCHC: 35 g/dL (ref 32.0–36.0)
MCV: 90.4 fL (ref 80.0–100.0)
MPV: 10.4 fL (ref 7.5–12.5)
Monocytes Relative: 11.8 %
Neutro Abs: 3081 cells/uL (ref 1500–7800)
Neutrophils Relative %: 49.7 %
Platelets: 310 10*3/uL (ref 140–400)
RBC: 4.99 10*6/uL (ref 4.20–5.80)
RDW: 12.9 % (ref 11.0–15.0)
Total Lymphocyte: 32.9 %
WBC: 6.2 10*3/uL (ref 3.8–10.8)

## 2021-03-19 LAB — LIPID PANEL
Cholesterol: 155 mg/dL (ref <200)
HDL: 50 mg/dL (ref >=40)
LDL Cholesterol (Calc): 82 mg/dL (calc)
Non-HDL Cholesterol (Calc): 105 mg/dL (calc) (ref <130)
Total CHOL/HDL Ratio: 3.1 (calc) (ref <5.0)
Triglycerides: 133 mg/dL (ref <150)

## 2021-03-19 LAB — TSH: TSH: 1.99 mIU/L (ref 0.40–4.50)

## 2021-03-19 LAB — MAGNESIUM: Magnesium: 2.1 mg/dL (ref 1.5–2.5)

## 2021-03-25 ENCOUNTER — Other Ambulatory Visit: Payer: Self-pay | Admitting: Adult Health

## 2021-03-25 DIAGNOSIS — T7840XD Allergy, unspecified, subsequent encounter: Secondary | ICD-10-CM

## 2021-03-25 MED ORDER — AZELASTINE HCL 0.05 % OP SOLN
1.0000 [drp] | Freq: Two times a day (BID) | OPHTHALMIC | 99 refills | Status: DC | PRN
Start: 2021-03-25 — End: 2022-03-30

## 2021-06-26 ENCOUNTER — Ambulatory Visit: Payer: PPO | Admitting: Internal Medicine

## 2021-06-26 ENCOUNTER — Encounter: Payer: Self-pay | Admitting: Internal Medicine

## 2021-06-26 ENCOUNTER — Other Ambulatory Visit: Payer: Self-pay

## 2021-06-26 VITALS — BP 120/72 | HR 53 | Temp 97.7°F | Resp 16 | Ht 68.0 in | Wt 180.8 lb

## 2021-06-26 DIAGNOSIS — K219 Gastro-esophageal reflux disease without esophagitis: Secondary | ICD-10-CM | POA: Diagnosis not present

## 2021-06-26 DIAGNOSIS — E782 Mixed hyperlipidemia: Secondary | ICD-10-CM

## 2021-06-26 DIAGNOSIS — I10 Essential (primary) hypertension: Secondary | ICD-10-CM | POA: Diagnosis not present

## 2021-06-26 DIAGNOSIS — E559 Vitamin D deficiency, unspecified: Secondary | ICD-10-CM

## 2021-06-26 DIAGNOSIS — I700 Atherosclerosis of aorta: Secondary | ICD-10-CM

## 2021-06-26 DIAGNOSIS — Z79899 Other long term (current) drug therapy: Secondary | ICD-10-CM | POA: Diagnosis not present

## 2021-06-26 DIAGNOSIS — R7309 Other abnormal glucose: Secondary | ICD-10-CM | POA: Diagnosis not present

## 2021-06-26 NOTE — Patient Instructions (Signed)

## 2021-06-26 NOTE — Progress Notes (Signed)
Future Appointments  Date Time Provider [place]  06/26/2021 10:30 AM Unk Pinto, MD [facility] None  12/17/2021  3:00 PM Unk Pinto, MD [facility] None  03/18/2022 11:30 AM Liane Comber, NP [facility] None    History of Present Illness:       This very nice 70 y.o.  MWM presents for 3 month follow up with HTN, HLD, Pre-Diabetes and Vitamin D Deficiency. Patient has GERD controlled on diet & his meds. Abd CT scan on 06/15/2015 showed distal Aortic Atherosclerosis.       Patient is treated for HTN  (1995) & BP has been controlled at home. Today's BP is at goal - 120/72. Patient has had no complaints of any cardiac type chest pain, palpitations, dyspnea / orthopnea / PND, dizziness, claudication, or dependent edema.       Hyperlipidemia is controlled with diet & Atorvastatin.   Patient denies myalgias or other med SE's. Last Lipids were at goal:  Lab Results  Component Value Date   CHOL 155 03/18/2021   HDL 50 03/18/2021   LDLCALC 82 03/18/2021   TRIG 133 03/18/2021   CHOLHDL 3.1 03/18/2021    Also, the patient has history of PreDiabetes (A1c 6.0% /2012) and has had no symptoms of reactive hypoglycemia, diabetic polys, paresthesias or visual blurring.  Last A1c was not at goal:  Lab Results  Component Value Date   HGBA1C 6.0 (H) 12/02/2020                                                         Further, the patient also has history of Vitamin D Deficiency ("34" /2012) and supplements vitamin D without any suspected side-effects. Last vitamin D was at goal:  Lab Results  Component Value Date   VD25OH 79 12/02/2020     Current Outpatient Medications on File Prior to Visit  Medication Sig   ALPRAZolam 0.5 MG tablet Take 1/2 to 1 tablet 3 x /day as needed for Anxiety   ASPIRIN 81 PO Take  daily.   atorvastatin 10 MG  Take 1 tab every other day for cholesterol.   OPTIVAR 0.05 % ophth soln Place 1 drop into  eyes 2 times daily as needed.    VITAMIN D 5,000 Units  Take daily.   Fexofenadine  Take as needed.    ibuprofen 800 MG tablet Take  every 8 hours as needed.   olmesartan  40 MG tablet TAKE 1 TABLET DAILY    omeprazole  20 MG capsule TAKE 1 CAPSULE EVERY DAY   PRED FORTE 1 % ophth susp Place 1-2 drops in both eyes twice daily as needed.   vitamin C 500 MG tablet Take daily.   zinc 50 MG tablet Take daily.     Allergies  Allergen Reactions   Hismanal [Astemizole]    Latex    Penicillins Hives    PMHx:   Past Medical History:  Diagnosis Date   Allergy    Cancer ([place])    skin cancer melanoma and basal cell lt. cheek   Cataract    both eyes no surgery needed   Chronic kidney disease    kidney stones   GERD (gastroesophageal reflux disease)    Hyperlipidemia    Hypertension    white coat syndrome  Prediabetes    Vitamin D deficiency     Immunization History  Administered Date(s) Administered   Influenza, High Dose Seasonal PF 10/12/2017, 10/13/2018, 10/17/2019, 10/14/2020   Influenza-Unspecified 10/23/2015   Moderna Sars-Covid-2 Vaccination 01/24/2020, 02/21/2020, 10/16/2020   Pneumococcal Conjugate-13 07/25/2017   Pneumococcal Polysaccharide-23 06/10/2011, 07/26/2018   Td 04/14/2006   Tdap 07/20/2016   Zoster, Live 01/16/2013    Past Surgical History:  Procedure Laterality Date   BASAL CELL CARCINOMA EXCISION     COLONOSCOPY     ELBOW ARTHROSCOPY WITH TENDON RECONSTRUCTION Left 2013   GUM SURGERY Left 2017   bone graft, root canal left side   KNEE ARTHROSCOPY Right 1982, 1999   melanoma excision     cheek lt.   SPLENECTOMY      FHx:    Reviewed / unchanged  SHx:    Reviewed / unchanged   Systems Review:  Constitutional: Denies fever, chills, wt changes, headaches, insomnia, fatigue, night sweats, change in appetite. Eyes: Denies redness, blurred vision, diplopia, discharge, itchy, watery eyes.  ENT: Denies discharge, congestion, post nasal drip, epistaxis, sore throat, earache,  hearing loss, dental pain, tinnitus, vertigo, sinus pain, snoring.  CV: Denies chest pain, palpitations, irregular heartbeat, syncope, dyspnea, diaphoresis, orthopnea, PND, claudication or edema. Respiratory: denies cough, dyspnea, DOE, pleurisy, hoarseness, laryngitis, wheezing.  Gastrointestinal: Denies dysphagia, odynophagia, heartburn, reflux, water brash, abdominal pain or cramps, nausea, vomiting, bloating, diarrhea, constipation, hematemesis, melena, hematochezia  or hemorrhoids. Genitourinary: Denies dysuria, frequency, urgency, nocturia, hesitancy, discharge, hematuria or flank pain. Musculoskeletal: Denies arthralgias, myalgias, stiffness, jt. swelling, pain, limping or strain/sprain.  Skin: Denies pruritus, rash, hives, warts, acne, eczema or change in skin lesion(s). Neuro: No weakness, tremor, incoordination, spasms, paresthesia or pain. Psychiatric: Denies confusion, memory loss or sensory loss. Endo: Denies change in weight, skin or hair change.  Heme/Lymph: No excessive bleeding, bruising or enlarged lymph nodes.  Physical Exam  BP 120/72   Pulse (!) 53   Temp 97.7 F (36.5 C)   Resp 16   Ht 5\' 8"  (1.727 m)   Wt 180 lb 12.8 oz (82 kg)   SpO2 97%   BMI 27.49 kg/m   Appears  well nourished, well groomed  and in no distress.  Eyes: PERRLA, EOMs, conjunctiva no swelling or erythema. Sinuses: No frontal/maxillary tenderness ENT/Mouth: EAC's clear, TM's nl w/o erythema, bulging. Nares clear w/o erythema, swelling, exudates. Oropharynx clear without erythema or exudates. Oral hygiene is good. Tongue normal, non obstructing. Hearing intact.  Neck: Supple. Thyroid not palpable. Car 2+/2+ without bruits, nodes or JVD. Chest: Respirations nl with BS clear & equal w/o rales, rhonchi, wheezing or stridor.  Cor: Heart sounds normal w/ regular rate and rhythm without sig. murmurs, gallops, clicks or rubs. Peripheral pulses normal and equal  without edema.  Abdomen: Soft & bowel  sounds normal. Non-tender w/o guarding, rebound, hernias, masses or organomegaly.  Lymphatics: Unremarkable.  Musculoskeletal: Full ROM all peripheral extremities, joint stability, 5/5 strength and normal gait.  Skin: Warm, dry without exposed rashes, lesions or ecchymosis apparent.  Neuro: Cranial nerves intact, reflexes equal bilaterally. Sensory-motor testing grossly intact. Tendon reflexes grossly intact.  Pysch: Alert & oriented x 3.  Insight and judgement nl & appropriate. No ideations.  Assessment and Plan:  1. Essential hypertension  - Continue medication, monitor blood pressure at home.  - Continue DASH diet.  Reminder to go to the ER if any CP,  SOB, nausea, dizziness, severe HA, changes vision/speech.   - CBC with Differential/Platelet -  COMPLETE METABOLIC PANEL WITH GFR - Magnesium - TSH  2. Hyperlipidemia, mixed  - Continue diet/meds, exercise,& lifestyle modifications.  - Continue monitor periodic cholesterol/liver & renal functions    - Lipid panel - TSH  3. Abnormal glucose  - Continue diet, exercise  - Lifestyle modifications.  - Monitor appropriate labs   - Hemoglobin A1c - Insulin, random  4. Vitamin D deficiency  - Continue supplementation   - VITAMIN D 25 Hydroxy   5. Gastroesophageal reflux disease  - CBC with Differential/Platelet  6. Aortic atherosclerosis ([place]) by Abd CTscan on 06/15/2015  - Lipid panel  7. Medication management  - CBC with Differential/Platelet - COMPLETE METABOLIC PANEL WITH GFR - Magnesium - Lipid panel - TSH - Hemoglobin A1c - Insulin, random - VITAMIN D 25 Hydroxy         Discussed  regular exercise, BP monitoring, weight control to achieve/maintain BMI less than 25 and discussed med and SE's. Recommended labs to assess and monitor clinical status with further disposition pending results of labs.  I discussed the assessment and treatment plan with the patient. The patient was provided an opportunity to ask  questions and all were answered. The patient agreed with the plan and demonstrated an understanding of the instructions.  I provided over 30 minutes of exam, counseling, chart review and  complex critical decision making.        The patient was advised to call back or seek an in-person evaluation if the symptoms worsen or if the condition fails to improve as anticipated.   Kirtland Bouchard, MD

## 2021-06-27 NOTE — Progress Notes (Signed)
============================================================ -   Test results slightly outside the reference range are not unusual. If there is anything important, I will review this with you,  otherwise it is considered normal test values.  If you have further questions,  please do not hesitate to contact me at the office or via My Chart.  ============================================================ ============================================================  - Total Chol = 142 and LDL Chol = 73 - Both  Excellent   - Very low risk for Heart Attack  / Stroke  - Please continue Atorvastatin Same  ============================================================ ============================================================  - A1c - is back in Normal NonDiabetic Range - Great ! ============================================================ ============================================================  - Vitamin D = 108       (Goal or Ideal is between 670 - 110)   - Please keep dose Same  ============================================================ ============================================================  - All Else - CBC - Kidneys - Electrolytes - Liver - Magnesium & Thyroid    - all  Normal / OK ============================================================ ============================================================  -  Keep up the [place] Work  ! ============================================================ ============================================================

## 2021-06-28 ENCOUNTER — Encounter: Payer: Self-pay | Admitting: Internal Medicine

## 2021-06-29 LAB — CBC WITH DIFFERENTIAL/PLATELET
Absolute Monocytes: 748 cells/uL (ref 200–950)
Basophils Absolute: 59 cells/uL (ref 0–200)
Basophils Relative: 0.9 %
Eosinophils Absolute: 332 cells/uL (ref 15–500)
Eosinophils Relative: 5.1 %
HCT: 47.1 % (ref 38.5–50.0)
Hemoglobin: 15.8 g/dL (ref 13.2–17.1)
Lymphs Abs: 2165 cells/uL (ref 850–3900)
MCH: 30.8 pg (ref 27.0–33.0)
MCHC: 33.5 g/dL (ref 32.0–36.0)
MCV: 91.8 fL (ref 80.0–100.0)
MPV: 10.3 fL (ref 7.5–12.5)
Monocytes Relative: 11.5 %
Neutro Abs: 3198 cells/uL (ref 1500–7800)
Neutrophils Relative %: 49.2 %
Platelets: 302 10*3/uL (ref 140–400)
RBC: 5.13 10*6/uL (ref 4.20–5.80)
RDW: 12.5 % (ref 11.0–15.0)
Total Lymphocyte: 33.3 %
WBC: 6.5 10*3/uL (ref 3.8–10.8)

## 2021-06-29 LAB — HEMOGLOBIN A1C
Hgb A1c MFr Bld: 5.6 % of total Hgb (ref <5.7)
Mean Plasma Glucose: 114 mg/dL
eAG (mmol/L): 6.3 mmol/L

## 2021-06-29 LAB — COMPLETE METABOLIC PANEL WITH GFR
AG Ratio: 1.6 (calc) (ref 1.0–2.5)
ALT: 34 U/L (ref 9–46)
AST: 23 U/L (ref 10–35)
Albumin: 4.3 g/dL (ref 3.6–5.1)
Alkaline phosphatase (APISO): 75 U/L (ref 35–144)
BUN: 19 mg/dL (ref 7–25)
CO2: 27 mmol/L (ref 20–32)
Calcium: 9.9 mg/dL (ref 8.6–10.3)
Chloride: 104 mmol/L (ref 98–110)
Creat: 1.12 mg/dL (ref 0.70–1.25)
GFR, Est African American: 77 mL/min/{1.73_m2} (ref >=60)
GFR, Est Non African American: 67 mL/min/{1.73_m2} (ref >=60)
Globulin: 2.7 g/dL (calc) (ref 1.9–3.7)
Glucose, Bld: 94 mg/dL (ref 65–99)
Potassium: 4.7 mmol/L (ref 3.5–5.3)
Sodium: 138 mmol/L (ref 135–146)
Total Bilirubin: 0.7 mg/dL (ref 0.2–1.2)
Total Protein: 7 g/dL (ref 6.1–8.1)

## 2021-06-29 LAB — LIPID PANEL
Cholesterol: 142 mg/dL (ref <200)
HDL: 53 mg/dL (ref >=40)
LDL Cholesterol (Calc): 73 mg/dL (calc)
Non-HDL Cholesterol (Calc): 89 mg/dL (calc) (ref <130)
Total CHOL/HDL Ratio: 2.7 (calc) (ref <5.0)
Triglycerides: 82 mg/dL (ref <150)

## 2021-06-29 LAB — INSULIN, RANDOM: Insulin: 7.1 u[IU]/mL

## 2021-06-29 LAB — VITAMIN D 25 HYDROXY (VIT D DEFICIENCY, FRACTURES): Vit D, 25-Hydroxy: 108 ng/mL — ABNORMAL HIGH (ref 30–100)

## 2021-06-29 LAB — MAGNESIUM: Magnesium: 2.2 mg/dL (ref 1.5–2.5)

## 2021-06-29 LAB — TSH: TSH: 1.81 mIU/L (ref 0.40–4.50)

## 2021-07-08 ENCOUNTER — Other Ambulatory Visit: Payer: Self-pay | Admitting: Adult Health

## 2021-09-03 ENCOUNTER — Ambulatory Visit: Payer: PPO | Admitting: Adult Health

## 2021-10-13 ENCOUNTER — Ambulatory Visit: Payer: PPO

## 2021-10-13 ENCOUNTER — Other Ambulatory Visit: Payer: Self-pay

## 2021-10-13 ENCOUNTER — Ambulatory Visit (INDEPENDENT_AMBULATORY_CARE_PROVIDER_SITE_OTHER): Payer: PPO

## 2021-10-13 VITALS — Temp 97.5°F

## 2021-10-13 DIAGNOSIS — Z23 Encounter for immunization: Secondary | ICD-10-CM | POA: Diagnosis not present

## 2021-12-16 ENCOUNTER — Encounter: Payer: Self-pay | Admitting: Internal Medicine

## 2021-12-16 NOTE — Patient Instructions (Signed)

## 2021-12-16 NOTE — Progress Notes (Signed)
------------------------------------------------------------- Annual  Screening/Preventative Visit  & Comprehensive Evaluation & Examination  Future Appointments  Date Time Provider Department  12/17/2021  3:00 PM Unk Pinto, MD [facility]  03/18/2022 11:30 AM Liane Comber, NP [facility]  12/23/2022  3:00 PM Unk Pinto, MD [facility]            This very nice 70 y.o. MWM presents for a Screening /Preventative Visit & comprehensive evaluation and management of multiple medical co-morbidities.  Patient has been followed for HTN, HLD, Prediabetes and Vitamin D Deficiency. Patient has GERD controlled on his meds .   Abd CT scan in 2016 revealed Aortic Arthrosclerosis.        HTN predates circa 1995. Patient's BP has been controlled at home.  Today's BP is at goal - 136/86. Patient denies any cardiac symptoms as chest pain, palpitations, shortness of breath, dizziness or ankle swelling.       Patient's hyperlipidemia is controlled with diet and medications. Patient denies myalgias or other medication SE's. Last lipids were at goal :  Lab Results  Component Value Date   CHOL 142 06/26/2021   HDL 53 06/26/2021   LDLCALC 73 06/26/2021   TRIG 82 06/26/2021   CHOLHDL 2.7 06/26/2021         Patient has hx/o prediabetes (A1c 6.0% /2012) and patient denies reactive hypoglycemic symptoms, visual blurring, diabetic polys or paresthesias. Last A1c was normal & at goal :   Lab Results  Component Value Date   HGBA1C 5.6 06/26/2021          Finally, patient has history of Vitamin D Deficiency ("34" /2012) and last vitamin D was borderline elevated :   Lab Results  Component Value Date   VD25OH 108 (H) 06/26/2021     Current Outpatient Medications on File Prior to Visit  Medication Sig   ALPRAZolam  0.5 MG tablet Take 1/2 to 1 tablet 3 x /day as needed for Anxiety   ASPIRIN 81  Take  daily.   atorvastatin  10 MG tablet Take 1 tab every other day for cholesterol.    azelastine (OPTIVAR) 0.05 % ophth soln Place 1 drop into both eyes 2  times daily as needed.   VITAMIN D  Take 5,000 Units daily.   Fexofenadine  Take by mouth as needed.    ibuprofen  800 MG tablet Take every 8 hours as needed.   olmesartan (40 MG tablet TAKE 1 TABLET DAILY    omeprazole 20 MG capsule TAKE 1 CAPSULE EVERY DAY   PRED FORTE 1 % ophth susp Place 1-2 drops in both eyes twice daily as needed.   vitamin C  500 MG tablet Take daily.   zinc  50 MG tablet Take daily.     Allergies  Allergen Reactions   Hismanal [Astemizole]    Latex    Penicillins Hives     Past Medical History:  Diagnosis Date   Allergy    Cancer ([place])    skin cancer melanoma and basal cell lt. cheek   Cataract    both eyes no surgery needed   Chronic kidney disease    kidney stones   GERD (gastroesophageal reflux disease)    Hyperlipidemia    Hypertension    white coat syndrome   Prediabetes    Vitamin D deficiency      Health Maintenance  Topic Date Due   Zoster Vaccines- Shingrix (1 of 2) Never done   COVID-19 Vaccine (4 - Booster for Moderna series) 12/11/2020  COLONOSCOPY  10/07/2022   TETANUS/TDAP  07/20/2026   Pneumonia Vaccine 66+ Years old  Completed   INFUENZA VACCINE  Completed   Hepatitis C Screening  Completed   HPV VACCINES  Aged Out     Immunization History  Administered Date(s) Administered   Influenza, High Dose  10/17/2019, 10/14/2020, 10/13/2021   Influenza-Unspecified 10/23/2015   Moderna Sars-Covid-2 Vacc 01/24/2020, 02/21/2020, 10/16/2020   Pneumococcal -13 07/25/2017   Pneumococcal -23 06/10/2011, 07/26/2018   Td 04/14/2006   Tdap 07/20/2016   Zoster, Live 01/16/2013    Last Colon - 10/07/2017 - Dr Henrene Pastor  - Recc 5 yr f/u due Oct 2023  Past Surgical History:  Procedure Laterality Date   BASAL CELL CARCINOMA EXCISION     COLONOSCOPY     ELBOW ARTHROSCOPY WITH TENDON RECONSTRUCTION Left 2013   GUM SURGERY Left 2017   bone graft, root canal left side    KNEE ARTHROSCOPY Right 1982, 1999   melanoma excision     cheek lt.   SPLENECTOMY       Family History  Problem Relation Age of Onset   Alzheimer's disease Mother    Heart disease Father    Dementia Father    Colon polyps Brother    Colon cancer Neg Hx    Esophageal cancer Neg Hx    Rectal cancer Neg Hx    Stomach cancer Neg Hx      Social History   Tobacco Use   Smoking status: Never   Smokeless tobacco: Never  Substance Use Topics   Alcohol use: Yes    Alcohol/week: 5.0 standard drinks    Types: 5 Standard drinks or equivalent per week    Comment: Wine   Drug use: No      ROS Constitutional: Denies fever, chills, weight loss/gain, headaches, insomnia,  night sweats or change in appetite. Does c/o fatigue. Eyes: Denies redness, blurred vision, diplopia, discharge, itchy or watery eyes.  ENT: Denies discharge, congestion, post nasal drip, epistaxis, sore throat, earache, hearing loss, dental pain, Tinnitus, Vertigo, Sinus pain or snoring.  Cardio: Denies chest pain, palpitations, irregular heartbeat, syncope, dyspnea, diaphoresis, orthopnea, PND, claudication or edema Respiratory: denies cough, dyspnea, DOE, pleurisy, hoarseness, laryngitis or wheezing.  Gastrointestinal: Denies dysphagia, heartburn, reflux, water brash, pain, cramps, nausea, vomiting, bloating, diarrhea, constipation, hematemesis, melena, hematochezia, jaundice or hemorrhoids Genitourinary: Denies dysuria, frequency, urgency, nocturia, hesitancy, discharge, hematuria or flank pain Musculoskeletal: Denies arthralgia, myalgia, stiffness, Jt. Swelling, pain, limp or strain/sprain. Denies Falls. Skin: Denies puritis, rash, hives, warts, acne, eczema or change in skin lesion Neuro: No weakness, tremor, incoordination, spasms, paresthesia or pain Psychiatric: Denies confusion, memory loss or sensory loss. Denies Depression. Endocrine: Denies change in weight, skin, hair change, nocturia, and paresthesia,  diabetic polys, visual blurring or hyper / hypo glycemic episodes.  Heme/Lymph: No excessive bleeding, bruising or enlarged lymph nodes.   Physical Exam  BP 136/86    Pulse 79    Temp 97.9 F (36.6 C)    Resp 16    Ht 5\' 8"  (1.727 m)    Wt 185 lb 6.4 oz (84.1 kg)    SpO2 97%    BMI 28.19 kg/m   General Appearance: Well nourished and well groomed and in no apparent distress.  Eyes: PERRLA, EOMs, conjunctiva no swelling or erythema, normal fundi and vessels. Sinuses: No frontal/maxillary tenderness ENT/Mouth: EACs patent / TMs  nl. Nares clear without erythema, swelling, mucoid exudates. Oral hygiene is good. No erythema, swelling, or exudate. Tongue  normal, non-obstructing. Tonsils not swollen or erythematous. Hearing normal.  Neck: Supple, thyroid not palpable. No bruits, nodes or JVD. Respiratory: Respiratory effort normal.  BS equal and clear bilateral without rales, rhonci, wheezing or stridor. Cardio: Heart sounds are normal with regular rate and rhythm and no murmurs, rubs or gallops. Peripheral pulses are normal and equal bilaterally without edema. No aortic or femoral bruits. Chest: symmetric with normal excursions and percussion.  Abdomen: Soft, with Nl bowel sounds. Nontender, no guarding, rebound, hernias, masses, or organomegaly.  Lymphatics: Non tender without lymphadenopathy.  Musculoskeletal: Full ROM all peripheral extremities, joint stability, 5/5 strength, and normal gait. Skin: Warm and dry without rashes, lesions, cyanosis, clubbing or  ecchymosis.  Neuro: Cranial nerves intact, reflexes equal bilaterally. Normal muscle tone, no cerebellar symptoms. Sensation intact.  Pysch: Alert and oriented x 3 with normal affect, insight and judgment appropriate.   Assessment and Plan  1. Annual Preventative/Screening Exam    2. Essential hypertension  - EKG 12-Lead - [place], RETROPERITNL ABD,  LTD - Urinalysis, Routine w reflex microscopic - Microalbumin / creatinine urine  ratio - CBC with Differential/Platelet - COMPLETE METABOLIC PANEL WITH GFR - Magnesium - TSH  3. Hyperlipidemia, mixed  - EKG 12-Lead - [place], RETROPERITNL ABD,  LTD - Lipid panel - TSH  4. Abnormal glucose  - EKG 12-Lead - [place], RETROPERITNL ABD,  LTD - Hemoglobin A1c - Insulin, random  5. Vitamin D deficiency  - VITAMIN D 25 Hydrox  6. Aortic atherosclerosis ([place]) by Abd CTscan on 06/15/2015  - EKG 12-Lead - [place], RETROPERITNL ABD,  LTD  7. Gastroesophageal reflux disease  - CBC with Differential/Platelet  8. Screening for colorectal cancer  - POC Hemoccult Bld/Stl   9. BPH with obstruction/lower urinary tract symptoms  - PSA  10. Prostate cancer screening  - PSA  11. Screening for ischemic heart disease  - EKG 12-Lead  12. FHx: heart disease  - EKG 12-Lead - [place], RETROPERITNL ABD,  LTD  13. Screening for AAA (aortic abdominal aneurysm)  - [place], RETROPERITNL ABD,  LTD  14. Medication management  - Urinalysis, Routine w reflex microscopic - Microalbumin / creatinine urine ratio - CBC with Differential/Platelet - COMPLETE METABOLIC PANEL WITH GFR - Magnesium - Lipid panel - TSH - Hemoglobin A1 - Insulin, random - VITAMIN D 25 Hydroxy          Patient was counseled in prudent diet, weight control to achieve/maintain BMI less than 25, BP monitoring, regular exercise and medications as discussed.  Discussed med effects and SE's. Routine screening labs and tests as requested with regular follow-up as recommended. Over 40 minutes of exam, counseling, chart review and high complex critical decision making was performed   Kirtland Bouchard, MD

## 2021-12-17 ENCOUNTER — Other Ambulatory Visit: Payer: Self-pay

## 2021-12-17 ENCOUNTER — Encounter: Payer: Self-pay | Admitting: Internal Medicine

## 2021-12-17 ENCOUNTER — Ambulatory Visit (INDEPENDENT_AMBULATORY_CARE_PROVIDER_SITE_OTHER): Payer: PPO | Admitting: Internal Medicine

## 2021-12-17 VITALS — BP 136/86 | HR 79 | Temp 97.9°F | Resp 16 | Ht 68.0 in | Wt 185.4 lb

## 2021-12-17 DIAGNOSIS — F419 Anxiety disorder, unspecified: Secondary | ICD-10-CM

## 2021-12-17 DIAGNOSIS — E559 Vitamin D deficiency, unspecified: Secondary | ICD-10-CM | POA: Diagnosis not present

## 2021-12-17 DIAGNOSIS — Z1211 Encounter for screening for malignant neoplasm of colon: Secondary | ICD-10-CM

## 2021-12-17 DIAGNOSIS — Z79899 Other long term (current) drug therapy: Secondary | ICD-10-CM | POA: Diagnosis not present

## 2021-12-17 DIAGNOSIS — I700 Atherosclerosis of aorta: Secondary | ICD-10-CM | POA: Diagnosis not present

## 2021-12-17 DIAGNOSIS — Z136 Encounter for screening for cardiovascular disorders: Secondary | ICD-10-CM

## 2021-12-17 DIAGNOSIS — K219 Gastro-esophageal reflux disease without esophagitis: Secondary | ICD-10-CM

## 2021-12-17 DIAGNOSIS — E782 Mixed hyperlipidemia: Secondary | ICD-10-CM | POA: Diagnosis not present

## 2021-12-17 DIAGNOSIS — Z0001 Encounter for general adult medical examination with abnormal findings: Secondary | ICD-10-CM

## 2021-12-17 DIAGNOSIS — Z8249 Family history of ischemic heart disease and other diseases of the circulatory system: Secondary | ICD-10-CM | POA: Diagnosis not present

## 2021-12-17 DIAGNOSIS — R350 Frequency of micturition: Secondary | ICD-10-CM | POA: Diagnosis not present

## 2021-12-17 DIAGNOSIS — Z125 Encounter for screening for malignant neoplasm of prostate: Secondary | ICD-10-CM | POA: Diagnosis not present

## 2021-12-17 DIAGNOSIS — Z0000 Encounter for general adult medical examination without abnormal findings: Secondary | ICD-10-CM | POA: Diagnosis not present

## 2021-12-17 DIAGNOSIS — Z13220 Encounter for screening for lipoid disorders: Secondary | ICD-10-CM | POA: Diagnosis not present

## 2021-12-17 DIAGNOSIS — N138 Other obstructive and reflux uropathy: Secondary | ICD-10-CM | POA: Diagnosis not present

## 2021-12-17 DIAGNOSIS — N401 Enlarged prostate with lower urinary tract symptoms: Secondary | ICD-10-CM | POA: Diagnosis not present

## 2021-12-17 DIAGNOSIS — R7309 Other abnormal glucose: Secondary | ICD-10-CM

## 2021-12-17 DIAGNOSIS — I10 Essential (primary) hypertension: Secondary | ICD-10-CM

## 2021-12-17 MED ORDER — ALPRAZOLAM 0.5 MG PO TABS
ORAL_TABLET | ORAL | 0 refills | Status: DC
Start: 2021-12-17 — End: 2023-02-04

## 2021-12-18 LAB — LIPID PANEL
Cholesterol: 151 mg/dL (ref <200)
HDL: 56 mg/dL (ref >=40)
LDL Cholesterol (Calc): 75 mg/dL (calc)
Non-HDL Cholesterol (Calc): 95 mg/dL (calc) (ref <130)
Total CHOL/HDL Ratio: 2.7 (calc) (ref <5.0)
Triglycerides: 117 mg/dL (ref <150)

## 2021-12-18 LAB — INSULIN, RANDOM: Insulin: 7.3 u[IU]/mL

## 2021-12-18 LAB — CBC WITH DIFFERENTIAL/PLATELET
Absolute Monocytes: 924 cells/uL (ref 200–950)
Basophils Absolute: 42 cells/uL (ref 0–200)
Basophils Relative: 0.5 %
Eosinophils Absolute: 294 cells/uL (ref 15–500)
Eosinophils Relative: 3.5 %
HCT: 44.6 % (ref 38.5–50.0)
Hemoglobin: 15.5 g/dL (ref 13.2–17.1)
Lymphs Abs: 2705 cells/uL (ref 850–3900)
MCH: 31.4 pg (ref 27.0–33.0)
MCHC: 34.8 g/dL (ref 32.0–36.0)
MCV: 90.3 fL (ref 80.0–100.0)
MPV: 10.1 fL (ref 7.5–12.5)
Monocytes Relative: 11 %
Neutro Abs: 4435 cells/uL (ref 1500–7800)
Neutrophils Relative %: 52.8 %
Platelets: 308 10*3/uL (ref 140–400)
RBC: 4.94 10*6/uL (ref 4.20–5.80)
RDW: 12.1 % (ref 11.0–15.0)
Total Lymphocyte: 32.2 %
WBC: 8.4 10*3/uL (ref 3.8–10.8)

## 2021-12-18 LAB — URINALYSIS, ROUTINE W REFLEX MICROSCOPIC
Bacteria, UA: NONE SEEN /HPF
Bilirubin Urine: NEGATIVE
Glucose, UA: NEGATIVE
Hgb urine dipstick: NEGATIVE
Hyaline Cast: NONE SEEN /LPF
Ketones, ur: NEGATIVE
Nitrite: NEGATIVE
Protein, ur: NEGATIVE
RBC / HPF: NONE SEEN /HPF (ref 0–2)
Specific Gravity, Urine: 1.019 (ref 1.001–1.035)
Squamous Epithelial / HPF: NONE SEEN /HPF (ref <=5)
pH: 6 (ref 5.0–8.0)

## 2021-12-18 LAB — COMPLETE METABOLIC PANEL WITH GFR
AG Ratio: 1.6 (calc) (ref 1.0–2.5)
ALT: 25 U/L (ref 9–46)
AST: 22 U/L (ref 10–35)
Albumin: 4.1 g/dL (ref 3.6–5.1)
Alkaline phosphatase (APISO): 70 U/L (ref 35–144)
BUN: 24 mg/dL (ref 7–25)
CO2: 25 mmol/L (ref 20–32)
Calcium: 9.9 mg/dL (ref 8.6–10.3)
Chloride: 104 mmol/L (ref 98–110)
Creat: 1.07 mg/dL (ref 0.70–1.35)
Globulin: 2.6 g/dL (calc) (ref 1.9–3.7)
Glucose, Bld: 93 mg/dL (ref 65–139)
Potassium: 4.2 mmol/L (ref 3.5–5.3)
Sodium: 137 mmol/L (ref 135–146)
Total Bilirubin: 0.7 mg/dL (ref 0.2–1.2)
Total Protein: 6.7 g/dL (ref 6.1–8.1)
eGFR: 75 mL/min/{1.73_m2} (ref >=60)

## 2021-12-18 LAB — MICROALBUMIN / CREATININE URINE RATIO
Creatinine, Urine: 116 mg/dL (ref 20–320)
Microalb Creat Ratio: 4 mcg/mg creat (ref <30)
Microalb, Ur: 0.5 mg/dL

## 2021-12-18 LAB — PSA: PSA: 0.62 ng/mL (ref <=4.00)

## 2021-12-18 LAB — HEMOGLOBIN A1C
Hgb A1c MFr Bld: 5.7 % of total Hgb — ABNORMAL HIGH (ref <5.7)
Mean Plasma Glucose: 117 mg/dL
eAG (mmol/L): 6.5 mmol/L

## 2021-12-18 LAB — MAGNESIUM: Magnesium: 2.2 mg/dL (ref 1.5–2.5)

## 2021-12-18 LAB — VITAMIN D 25 HYDROXY (VIT D DEFICIENCY, FRACTURES): Vit D, 25-Hydroxy: 84 ng/mL (ref 30–100)

## 2021-12-18 LAB — MICROSCOPIC MESSAGE

## 2021-12-18 LAB — TSH: TSH: 2.29 mIU/L (ref 0.40–4.50)

## 2022-01-07 ENCOUNTER — Other Ambulatory Visit: Payer: Self-pay | Admitting: Adult Health

## 2022-01-18 ENCOUNTER — Other Ambulatory Visit: Payer: Self-pay

## 2022-01-18 DIAGNOSIS — Z1212 Encounter for screening for malignant neoplasm of rectum: Secondary | ICD-10-CM | POA: Diagnosis not present

## 2022-01-18 DIAGNOSIS — Z1211 Encounter for screening for malignant neoplasm of colon: Secondary | ICD-10-CM

## 2022-01-18 LAB — POC HEMOCCULT BLD/STL (HOME/3-CARD/SCREEN)
Card #2 Fecal Occult Blod, POC: NEGATIVE
Card #3 Fecal Occult Blood, POC: NEGATIVE
Fecal Occult Blood, POC: NEGATIVE

## 2022-02-16 ENCOUNTER — Other Ambulatory Visit: Payer: Self-pay | Admitting: Adult Health

## 2022-02-16 DIAGNOSIS — L719 Rosacea, unspecified: Secondary | ICD-10-CM | POA: Diagnosis not present

## 2022-02-16 DIAGNOSIS — L570 Actinic keratosis: Secondary | ICD-10-CM | POA: Diagnosis not present

## 2022-02-16 DIAGNOSIS — Z86018 Personal history of other benign neoplasm: Secondary | ICD-10-CM | POA: Diagnosis not present

## 2022-02-16 DIAGNOSIS — L814 Other melanin hyperpigmentation: Secondary | ICD-10-CM | POA: Diagnosis not present

## 2022-02-16 DIAGNOSIS — Z85828 Personal history of other malignant neoplasm of skin: Secondary | ICD-10-CM | POA: Diagnosis not present

## 2022-02-16 DIAGNOSIS — I10 Essential (primary) hypertension: Secondary | ICD-10-CM

## 2022-02-16 DIAGNOSIS — Z23 Encounter for immunization: Secondary | ICD-10-CM | POA: Diagnosis not present

## 2022-02-16 DIAGNOSIS — L578 Other skin changes due to chronic exposure to nonionizing radiation: Secondary | ICD-10-CM | POA: Diagnosis not present

## 2022-02-16 DIAGNOSIS — D225 Melanocytic nevi of trunk: Secondary | ICD-10-CM | POA: Diagnosis not present

## 2022-02-16 DIAGNOSIS — L821 Other seborrheic keratosis: Secondary | ICD-10-CM | POA: Diagnosis not present

## 2022-02-16 DIAGNOSIS — Z85820 Personal history of malignant melanoma of skin: Secondary | ICD-10-CM | POA: Diagnosis not present

## 2022-03-18 ENCOUNTER — Ambulatory Visit (INDEPENDENT_AMBULATORY_CARE_PROVIDER_SITE_OTHER): Payer: PPO | Admitting: Adult Health

## 2022-03-18 ENCOUNTER — Encounter: Payer: Self-pay | Admitting: Adult Health

## 2022-03-18 VITALS — BP 134/76 | HR 61 | Temp 97.9°F | Wt 183.0 lb

## 2022-03-18 DIAGNOSIS — R6889 Other general symptoms and signs: Secondary | ICD-10-CM | POA: Diagnosis not present

## 2022-03-18 DIAGNOSIS — R7303 Prediabetes: Secondary | ICD-10-CM

## 2022-03-18 DIAGNOSIS — K5730 Diverticulosis of large intestine without perforation or abscess without bleeding: Secondary | ICD-10-CM | POA: Diagnosis not present

## 2022-03-18 DIAGNOSIS — E559 Vitamin D deficiency, unspecified: Secondary | ICD-10-CM | POA: Diagnosis not present

## 2022-03-18 DIAGNOSIS — I10 Essential (primary) hypertension: Secondary | ICD-10-CM

## 2022-03-18 DIAGNOSIS — E663 Overweight: Secondary | ICD-10-CM | POA: Diagnosis not present

## 2022-03-18 DIAGNOSIS — E785 Hyperlipidemia, unspecified: Secondary | ICD-10-CM | POA: Diagnosis not present

## 2022-03-18 DIAGNOSIS — Z86010 Personal history of colonic polyps: Secondary | ICD-10-CM | POA: Diagnosis not present

## 2022-03-18 DIAGNOSIS — Z85820 Personal history of malignant melanoma of skin: Secondary | ICD-10-CM

## 2022-03-18 DIAGNOSIS — Z0001 Encounter for general adult medical examination with abnormal findings: Secondary | ICD-10-CM

## 2022-03-18 DIAGNOSIS — J45909 Unspecified asthma, uncomplicated: Secondary | ICD-10-CM | POA: Diagnosis not present

## 2022-03-18 DIAGNOSIS — F418 Other specified anxiety disorders: Secondary | ICD-10-CM | POA: Diagnosis not present

## 2022-03-18 DIAGNOSIS — I700 Atherosclerosis of aorta: Secondary | ICD-10-CM

## 2022-03-18 DIAGNOSIS — Z0000 Encounter for general adult medical examination without abnormal findings: Secondary | ICD-10-CM

## 2022-03-18 DIAGNOSIS — K219 Gastro-esophageal reflux disease without esophagitis: Secondary | ICD-10-CM | POA: Diagnosis not present

## 2022-03-18 MED ORDER — LANSOPRAZOLE 30 MG PO CPDR: DELAYED_RELEASE_CAPSULE | ORAL | 3 refills | Status: DC
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
## 2022-03-18 NOTE — Patient Instructions (Addendum)
?  Mr. Billig , ?Thank you for taking time to come for your Medicare Wellness Visit. I appreciate your ongoing commitment to your health goals. Please review the following plan we discussed and let me know if I can assist you in the future.  ? ?These are the goals we discussed: ? Goals   ? ?   DIET - INCREASE WATER INTAKE   ?   65-80+ fluid ounces ?  ?   Weight (lb) < 180 lb (81.6 kg) (pt-stated)   ? ?  ?  ?This is a list of the screening recommended for you and due dates:  ?Health Maintenance  ?Topic Date Due  ? Zoster (Shingles) Vaccine (1 of 2) Never done  ? COVID-19 Vaccine (4 - Booster for Moderna series) 12/11/2020  ? Colon Cancer Screening  10/07/2022  ? Tetanus Vaccine  07/20/2026  ? Pneumonia Vaccine  Completed  ? Flu Shot  Completed  ? Hepatitis C Screening: USPSTF Recommendation to screen - Ages 43-79 yo.  Completed  ? HPV Vaccine  Aged Out  ? ? ?Please bring notarized copy of power of attorney - will scan in the system ? ?Please check with insurance/pharmacy about shingrix coverage ? ?Please send covid 19 vaccine booster dates ? ? ? ? ? ? ?

## 2022-03-19 LAB — LIPID PANEL
Cholesterol: 139 mg/dL (ref <200)
HDL: 58 mg/dL (ref >=40)
LDL Cholesterol (Calc): 67 mg/dL (calc)
Non-HDL Cholesterol (Calc): 81 mg/dL (calc) (ref <130)
Total CHOL/HDL Ratio: 2.4 (calc) (ref <5.0)
Triglycerides: 60 mg/dL (ref <150)

## 2022-03-19 LAB — CBC WITH DIFFERENTIAL/PLATELET
Absolute Monocytes: 777 cells/uL (ref 200–950)
Basophils Absolute: 60 cells/uL (ref 0–200)
Basophils Relative: 0.9 %
Eosinophils Absolute: 328 cells/uL (ref 15–500)
Eosinophils Relative: 4.9 %
HCT: 45.6 % (ref 38.5–50.0)
Hemoglobin: 15.7 g/dL (ref 13.2–17.1)
Lymphs Abs: 2452 cells/uL (ref 850–3900)
MCH: 31.2 pg (ref 27.0–33.0)
MCHC: 34.4 g/dL (ref 32.0–36.0)
MCV: 90.5 fL (ref 80.0–100.0)
MPV: 10.6 fL (ref 7.5–12.5)
Monocytes Relative: 11.6 %
Neutro Abs: 3082 cells/uL (ref 1500–7800)
Neutrophils Relative %: 46 %
Platelets: 280 10*3/uL (ref 140–400)
RBC: 5.04 10*6/uL (ref 4.20–5.80)
RDW: 12.9 % (ref 11.0–15.0)
Total Lymphocyte: 36.6 %
WBC: 6.7 10*3/uL (ref 3.8–10.8)

## 2022-03-19 LAB — COMPLETE METABOLIC PANEL WITH GFR
AG Ratio: 1.7 (calc) (ref 1.0–2.5)
ALT: 30 U/L (ref 9–46)
AST: 26 U/L (ref 10–35)
Albumin: 4.3 g/dL (ref 3.6–5.1)
Alkaline phosphatase (APISO): 69 U/L (ref 35–144)
BUN: 21 mg/dL (ref 7–25)
CO2: 25 mmol/L (ref 20–32)
Calcium: 9.8 mg/dL (ref 8.6–10.3)
Chloride: 105 mmol/L (ref 98–110)
Creat: 1.17 mg/dL (ref 0.70–1.28)
Globulin: 2.5 g/dL (calc) (ref 1.9–3.7)
Glucose, Bld: 100 mg/dL — ABNORMAL HIGH (ref 65–99)
Potassium: 4.4 mmol/L (ref 3.5–5.3)
Sodium: 139 mmol/L (ref 135–146)
Total Bilirubin: 0.7 mg/dL (ref 0.2–1.2)
Total Protein: 6.8 g/dL (ref 6.1–8.1)
eGFR: 67 mL/min/{1.73_m2} (ref >=60)

## 2022-03-19 LAB — MAGNESIUM: Magnesium: 2.1 mg/dL (ref 1.5–2.5)

## 2022-03-19 LAB — TSH: TSH: 2.13 mIU/L (ref 0.40–4.50)

## 2022-03-25 ENCOUNTER — Other Ambulatory Visit: Payer: Self-pay | Admitting: Adult Health

## 2022-03-25 DIAGNOSIS — E782 Mixed hyperlipidemia: Secondary | ICD-10-CM

## 2022-03-26 ENCOUNTER — Other Ambulatory Visit: Payer: Self-pay | Admitting: Adult Health

## 2022-03-26 DIAGNOSIS — T7840XD Allergy, unspecified, subsequent encounter: Secondary | ICD-10-CM

## 2022-06-21 ENCOUNTER — Ambulatory Visit: Payer: PPO | Admitting: Internal Medicine

## 2022-06-30 ENCOUNTER — Ambulatory Visit: Payer: PPO | Admitting: Internal Medicine

## 2022-06-30 ENCOUNTER — Ambulatory Visit (INDEPENDENT_AMBULATORY_CARE_PROVIDER_SITE_OTHER): Payer: PPO | Admitting: Internal Medicine

## 2022-06-30 ENCOUNTER — Encounter: Payer: Self-pay | Admitting: Internal Medicine

## 2022-06-30 VITALS — BP 137/79 | HR 72 | Temp 97.1°F | Resp 17 | Ht 68.0 in | Wt 184.4 lb

## 2022-06-30 DIAGNOSIS — K219 Gastro-esophageal reflux disease without esophagitis: Secondary | ICD-10-CM

## 2022-06-30 DIAGNOSIS — I700 Atherosclerosis of aorta: Secondary | ICD-10-CM

## 2022-06-30 DIAGNOSIS — E782 Mixed hyperlipidemia: Secondary | ICD-10-CM | POA: Diagnosis not present

## 2022-06-30 DIAGNOSIS — I10 Essential (primary) hypertension: Secondary | ICD-10-CM

## 2022-06-30 DIAGNOSIS — Z79899 Other long term (current) drug therapy: Secondary | ICD-10-CM | POA: Diagnosis not present

## 2022-06-30 DIAGNOSIS — E559 Vitamin D deficiency, unspecified: Secondary | ICD-10-CM

## 2022-06-30 DIAGNOSIS — R7309 Other abnormal glucose: Secondary | ICD-10-CM | POA: Diagnosis not present

## 2022-06-30 MED ORDER — ESOMEPRAZOLE MAGNESIUM 40 MG PO CPDR: DELAYED_RELEASE_CAPSULE | ORAL | 3 refills | Status: DC

## 2022-06-30 MED ORDER — FAMOTIDINE 40 MG PO TABS: ORAL_TABLET | ORAL | 3 refills | Status: DC

## 2022-06-30 NOTE — Patient Instructions (Signed)

## 2022-06-30 NOTE — Progress Notes (Signed)
Future Appointments  Date Time Provider Department  06/30/2022       6 mo 11:30 AM Unk Pinto, MD [facility]  12/23/2022         cpe  3:00 PM Unk Pinto, MD [facility]  03/22/2023        wellness  2:00 PM Alycia Rossetti, NP [facility]    History of Present Illness:      This very nice 71 y.o.  MWM presents for 6 month follow up with HTN, HLD, Pre-Diabetes and Vitamin D Deficiency. Patient has GERD not controlled on current diet &  meds. Abd CT scan on 06/15/2015 showed  Aortic Atherosclerosis.       Patient is treated for HTN  circa 1995 & BP has been controlled and today's BP is  137/79.  Patient denies any cardiac type chest pain, palpitations, dyspnea / orthopnea / PND, dizziness, claudication, or dependent edema.       Hyperlipidemia is controlled with diet & Atorvastatin.   Patient denies myalgias or other med SE's. Last Lipids were at goal:  Lab Results  Component Value Date   CHOL 139 03/18/2022   HDL 58 03/18/2022   LDLCALC 67 03/18/2022   TRIG 60 03/18/2022   CHOLHDL 2.4 03/18/2022     Also, the patient has history of PreDiabetes (A1c 6.0% /2012) and has had no symptoms of reactive hypoglycemia, diabetic polys, paresthesias or visual blurring.  Last A1c was near  goal:  Lab Results  Component Value Date   HGBA1C 5.7 (H) 12/17/2021                                                         Further, the patient also has history of Vitamin D Deficiency ("34" /2012) and supplements vitamin D without any suspected side-effects. Last vitamin D was at goal:  Lab Results  Component Value Date   VD25OH 84 12/17/2021       Current Outpatient Medications on File Prior to Visit  Medication Sig   ALPRAZolam 0.5 MG tablet Take 1/2 to 1 tablet 3 x /day as needed for Anxiety   ASPIRIN 81 PO Take  daily.   atorvastatin 10 MG  Take 1 tab every other day for cholesterol.   OPTIVAR 0.05 % ophth soln Place 1 drop into  eyes 2 times daily as needed.   VITAMIN D 5,000  Units  Take daily.   Fexofenadine  Take as needed.    ibuprofen 800 MG tablet Take  every 8 hours as needed.   olmesartan  40 MG tablet TAKE 1 TABLET DAILY    omeprazole  20 MG capsule TAKE 1 CAPSULE EVERY DAY   PRED FORTE 1 % ophth susp Place 1-2 drops in both eyes twice daily as needed.   vitamin C 500 MG tablet Take daily.   zinc 50 MG tablet Take daily.     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   Cataract    both eyes no surgery needed   Chronic kidney disease    kidney stones   GERD (  gastroesophageal reflux disease)    Hyperlipidemia    Hypertension    white coat syndrome   Prediabetes    Vitamin D deficiency     Immunization History  Administered Date(s) Administered   Influenza, High Dose Seasonal PF 10/12/2017, 10/13/2018, 10/17/2019, 10/14/2020   Influenza-Unspecified 10/23/2015   Moderna Sars-Covid-2 Vaccination 01/24/2020, 02/21/2020, 10/16/2020   Pneumococcal Conjugate-13 07/25/2017   Pneumococcal Polysaccharide-23 06/10/2011, 07/26/2018   Td 04/14/2006   Tdap 07/20/2016   Zoster, Live 01/16/2013    Past Surgical History:  Procedure Laterality Date   BASAL CELL CARCINOMA EXCISION     COLONOSCOPY     ELBOW ARTHROSCOPY WITH TENDON RECONSTRUCTION Left 2013   GUM SURGERY Left 2017   bone graft, root canal left side   KNEE ARTHROSCOPY Right 1982, 1999   melanoma excision     cheek lt.   SPLENECTOMY      FHx:    Reviewed / unchanged  SHx:    Reviewed / unchanged   Systems Review:  Constitutional: Denies fever, chills, wt changes, headaches, insomnia, fatigue, night sweats, change in appetite. Eyes: Denies redness, blurred vision, diplopia, discharge, itchy, watery eyes.  ENT: Denies discharge, congestion, post nasal drip, epistaxis, sore throat, earache, hearing loss, dental  pain, tinnitus, vertigo, sinus pain, snoring.  CV: Denies chest pain, palpitations, irregular heartbeat, syncope, dyspnea, diaphoresis, orthopnea, PND, claudication or edema. Respiratory: denies cough, dyspnea, DOE, pleurisy, hoarseness, laryngitis, wheezing.  Gastrointestinal: Denies dysphagia, odynophagia, heartburn, reflux, water brash, abdominal pain or cramps, nausea, vomiting, bloating, diarrhea, constipation, hematemesis, melena, hematochezia  or hemorrhoids. Genitourinary: Denies dysuria, frequency, urgency, nocturia, hesitancy, discharge, hematuria or flank pain. Musculoskeletal: Denies arthralgias, myalgias, stiffness, jt. swelling, pain, limping or strain/sprain.  Skin: Denies pruritus, rash, hives, warts, acne, eczema or change in skin lesion(s). Neuro: No weakness, tremor, incoordination, spasms, paresthesia or pain. Psychiatric: Denies confusion, memory loss or sensory loss. Endo: Denies change in weight, skin or hair change.  Heme/Lymph: No excessive bleeding, bruising or enlarged lymph nodes.  Physical Exam  BP 137/79   Pulse 72   Temp (!) 97.1 F (36.2 C)   Resp 17   Ht '5\' 8"'$  (1.727 m)   Wt 184 lb 6.4 oz (83.6 kg)   SpO2 99%   BMI 28.04 kg/m   Appears  well nourished, well groomed  and in no distress.  Eyes: PERRLA, EOMs, conjunctiva no swelling or erythema. Sinuses: No frontal/maxillary tenderness ENT/Mouth: EAC's clear, TM's nl w/o erythema, bulging. Nares clear w/o erythema, swelling, exudates. Oropharynx clear without erythema or exudates. Oral hygiene is good. Tongue normal, non obstructing. Hearing intact.  Neck: Supple. Thyroid not palpable. Car 2+/2+ without bruits, nodes or JVD. Chest: Respirations nl with BS clear & equal w/o rales, rhonchi, wheezing or stridor.  Cor: Heart sounds normal w/ regular rate and rhythm without sig. murmurs, gallops, clicks or rubs. Peripheral pulses normal and equal  without edema.  Abdomen: Soft & bowel sounds normal.  Non-tender w/o guarding, rebound, hernias, masses or organomegaly.  Lymphatics: Unremarkable.  Musculoskeletal: Full ROM all peripheral extremities, joint stability, 5/5 strength and normal gait.  Skin: Warm, dry without exposed rashes, lesions or ecchymosis apparent.  Neuro: Cranial nerves intact, reflexes equal bilaterally. Sensory-motor testing grossly intact. Tendon reflexes grossly intact.  Pysch: Alert & oriented x 3.  Insight and judgement nl & appropriate. No ideations.  Assessment and Plan:  1. Essential hypertension  - Continue medication, monitor blood pressure at home.  - Continue DASH diet.  Reminder to go to  the ER if any CP,  SOB, nausea, dizziness, severe HA, changes vision/speech.    - CBC with Differential/Platelet - COMPLETE METABOLIC PANEL WITH GFR - Magnesium - TSH  2. Hyperlipidemia, mixed  - Continue diet/meds, exercise,& lifestyle modifications.  - Continue monitor periodic cholesterol/liver & renal functions    - Lipid panel - TSH  3. Abnormal glucose  - Continue diet, exercise  - Lifestyle modifications.  - Monitor appropriate labs  - Continue supplementation  - Hemoglobin A1c - Insulin, random  4. Vitamin D deficiency  - VITAMIN D 25 Hydroxy   5. GERD  - Change med to Nexium/Pepcid  6. Aortic atherosclerosis ([place]) by Abd CTscan on 06/15/2015  - Lipid panel  7. Medication management  - CBC with Differential/Platelet - COMPLETE METABOLIC PANEL WITH GFR - Magnesium - Lipid panel - TSH - Hemoglobin A1c - Insulin, random - VITAMIN D 25 Hydroxy         Discussed  regular exercise, BP monitoring, weight control to achieve/maintain BMI less than 25 and discussed med and SE's. Recommended labs to assess and monitor clinical status with further disposition pending results of labs.  I discussed the assessment and treatment plan with the patient. The patient was provided an opportunity to ask questions and all were answered. The patient  agreed with the plan and demonstrated an understanding of the instructions.  I provided over 30 minutes of exam, counseling, chart review and  complex critical decision making.        The patient was advised to call back or seek an in-person evaluation if the symptoms worsen or if the condition fails to improve as anticipated.   Kirtland Bouchard, MD

## 2022-07-01 LAB — COMPLETE METABOLIC PANEL WITH GFR
AG Ratio: 1.9 (calc) (ref 1.0–2.5)
ALT: 27 U/L (ref 9–46)
AST: 22 U/L (ref 10–35)
Albumin: 4.3 g/dL (ref 3.6–5.1)
Alkaline phosphatase (APISO): 64 U/L (ref 35–144)
BUN: 22 mg/dL (ref 7–25)
CO2: 25 mmol/L (ref 20–32)
Calcium: 9.8 mg/dL (ref 8.6–10.3)
Chloride: 106 mmol/L (ref 98–110)
Creat: 1.25 mg/dL (ref 0.70–1.28)
Globulin: 2.3 g/dL (calc) (ref 1.9–3.7)
Glucose, Bld: 98 mg/dL (ref 65–99)
Potassium: 4.3 mmol/L (ref 3.5–5.3)
Sodium: 139 mmol/L (ref 135–146)
Total Bilirubin: 0.6 mg/dL (ref 0.2–1.2)
Total Protein: 6.6 g/dL (ref 6.1–8.1)
eGFR: 62 mL/min/{1.73_m2} (ref >=60)

## 2022-07-01 LAB — CBC WITH DIFFERENTIAL/PLATELET
Absolute Monocytes: 833 cells/uL (ref 200–950)
Basophils Absolute: 49 cells/uL (ref 0–200)
Basophils Relative: 0.7 %
Eosinophils Absolute: 350 cells/uL (ref 15–500)
Eosinophils Relative: 5 %
HCT: 43.6 % (ref 38.5–50.0)
Hemoglobin: 15.5 g/dL (ref 13.2–17.1)
Lymphs Abs: 2590 cells/uL (ref 850–3900)
MCH: 31.8 pg (ref 27.0–33.0)
MCHC: 35.6 g/dL (ref 32.0–36.0)
MCV: 89.5 fL (ref 80.0–100.0)
MPV: 10.7 fL (ref 7.5–12.5)
Monocytes Relative: 11.9 %
Neutro Abs: 3178 cells/uL (ref 1500–7800)
Neutrophils Relative %: 45.4 %
Platelets: 296 10*3/uL (ref 140–400)
RBC: 4.87 10*6/uL (ref 4.20–5.80)
RDW: 12.3 % (ref 11.0–15.0)
Total Lymphocyte: 37 %
WBC: 7 10*3/uL (ref 3.8–10.8)

## 2022-07-01 LAB — TSH: TSH: 2.06 mIU/L (ref 0.40–4.50)

## 2022-07-01 LAB — MAGNESIUM: Magnesium: 2.1 mg/dL (ref 1.5–2.5)

## 2022-07-01 LAB — LIPID PANEL
Cholesterol: 146 mg/dL (ref <200)
HDL: 55 mg/dL (ref >=40)
LDL Cholesterol (Calc): 73 mg/dL (calc)
Non-HDL Cholesterol (Calc): 91 mg/dL (calc) (ref <130)
Total CHOL/HDL Ratio: 2.7 (calc) (ref <5.0)
Triglycerides: 95 mg/dL (ref <150)

## 2022-07-01 LAB — HEMOGLOBIN A1C
Hgb A1c MFr Bld: 5.6 % of total Hgb (ref <5.7)
Mean Plasma Glucose: 114 mg/dL
eAG (mmol/L): 6.3 mmol/L

## 2022-07-01 LAB — INSULIN, RANDOM: Insulin: 13.4 u[IU]/mL

## 2022-07-01 LAB — VITAMIN D 25 HYDROXY (VIT D DEFICIENCY, FRACTURES): Vit D, 25-Hydroxy: 87 ng/mL (ref 30–100)

## 2022-07-02 NOTE — Progress Notes (Signed)
<><><><><><><><><><><><><><><><><><><><><><><><><><><><><><><><><> <><><><><><><><><><><><><><><><><><><><><><><><><><><><><><><><><> -   Test results slightly outside the reference range are not unusual. If there is anything important, I will review this with you,  otherwise it is considered normal test values.  If you have further questions,  please do not hesitate to contact me at the office or via My Chart.  <><><><><><><><><><><><><><><><><><><><><><><><><><><><><><><><><> <><><><><><><><><><><><><><><><><><><><><><><><><><><><><><><><><>  -  Total Chol = 146  &   LDL Chol = 73   - Both  Excellent   - Very low risk for Heart Attack  / Stroke <><><><><><><><><><><><><><><><><><><><><><><><><><><><><><><><><>  -  A1c = 5.6%  - Wonderful  - Back in Normal non Diabetic range  ! <><><><><><><><><><><><><><><><><><><><><><><><><><><><><><><><><>  -  Vitamin D = 87  - Excellent  !    Please keep dose same <><><><><><><><><><><><><><><><><><><><><><><><><><><><><><><><><>  -  All Else - CBC - Kidneys - Electrolytes - Liver - Magnesium & Thyroid    - all  Normal / OK <><><><><><><><><><><><><><><><><><><><><><><><><><><><><><><><><>  -  Keep up the Great Work  ! <><><><><><><><><><><><><><><><><><><><><><><><><><><><><><><><><> <><><><><><><><><><><><><><><><><><><><><><><><><><><><><><><><><>
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
## 2022-10-04 ENCOUNTER — Encounter: Payer: Self-pay | Admitting: Nurse Practitioner

## 2022-10-04 ENCOUNTER — Ambulatory Visit (INDEPENDENT_AMBULATORY_CARE_PROVIDER_SITE_OTHER): Payer: PPO | Admitting: Nurse Practitioner

## 2022-10-04 VITALS — BP 138/72 | HR 66 | Temp 97.9°F | Ht 68.0 in | Wt 186.4 lb

## 2022-10-04 DIAGNOSIS — F418 Other specified anxiety disorders: Secondary | ICD-10-CM | POA: Diagnosis not present

## 2022-10-04 DIAGNOSIS — R7309 Other abnormal glucose: Secondary | ICD-10-CM

## 2022-10-04 DIAGNOSIS — Z23 Encounter for immunization: Secondary | ICD-10-CM | POA: Diagnosis not present

## 2022-10-04 DIAGNOSIS — E559 Vitamin D deficiency, unspecified: Secondary | ICD-10-CM | POA: Diagnosis not present

## 2022-10-04 DIAGNOSIS — Z79899 Other long term (current) drug therapy: Secondary | ICD-10-CM

## 2022-10-04 DIAGNOSIS — I700 Atherosclerosis of aorta: Secondary | ICD-10-CM

## 2022-10-04 DIAGNOSIS — I10 Essential (primary) hypertension: Secondary | ICD-10-CM

## 2022-10-04 DIAGNOSIS — E782 Mixed hyperlipidemia: Secondary | ICD-10-CM | POA: Diagnosis not present

## 2022-10-04 DIAGNOSIS — T7840XD Allergy, unspecified, subsequent encounter: Secondary | ICD-10-CM

## 2022-10-04 DIAGNOSIS — E663 Overweight: Secondary | ICD-10-CM

## 2022-10-04 NOTE — Patient Instructions (Signed)

## 2022-10-05 LAB — COMPLETE METABOLIC PANEL WITH GFR
AG Ratio: 1.5 (calc) (ref 1.0–2.5)
ALT: 32 U/L (ref 9–46)
AST: 27 U/L (ref 10–35)
Albumin: 4.1 g/dL (ref 3.6–5.1)
Alkaline phosphatase (APISO): 74 U/L (ref 35–144)
BUN: 19 mg/dL (ref 7–25)
CO2: 24 mmol/L (ref 20–32)
Calcium: 9.5 mg/dL (ref 8.6–10.3)
Chloride: 105 mmol/L (ref 98–110)
Creat: 1.07 mg/dL (ref 0.70–1.28)
Globulin: 2.7 g/dL (calc) (ref 1.9–3.7)
Glucose, Bld: 89 mg/dL (ref 65–99)
Potassium: 4.3 mmol/L (ref 3.5–5.3)
Sodium: 138 mmol/L (ref 135–146)
Total Bilirubin: 0.7 mg/dL (ref 0.2–1.2)
Total Protein: 6.8 g/dL (ref 6.1–8.1)
eGFR: 75 mL/min/{1.73_m2} (ref >=60)

## 2022-10-05 LAB — LIPID PANEL
Cholesterol: 136 mg/dL (ref <200)
HDL: 49 mg/dL (ref >=40)
LDL Cholesterol (Calc): 69 mg/dL (calc)
Non-HDL Cholesterol (Calc): 87 mg/dL (calc) (ref <130)
Total CHOL/HDL Ratio: 2.8 (calc) (ref <5.0)
Triglycerides: 99 mg/dL (ref <150)

## 2022-10-05 LAB — MAGNESIUM: Magnesium: 2.1 mg/dL (ref 1.5–2.5)

## 2022-10-05 LAB — CBC WITH DIFFERENTIAL/PLATELET
Absolute Monocytes: 745 cells/uL (ref 200–950)
Basophils Absolute: 53 cells/uL (ref 0–200)
Basophils Relative: 0.7 %
Eosinophils Absolute: 403 cells/uL (ref 15–500)
Eosinophils Relative: 5.3 %
HCT: 44.8 % (ref 38.5–50.0)
Hemoglobin: 15.6 g/dL (ref 13.2–17.1)
Lymphs Abs: 2455 cells/uL (ref 850–3900)
MCH: 32.1 pg (ref 27.0–33.0)
MCHC: 34.8 g/dL (ref 32.0–36.0)
MCV: 92.2 fL (ref 80.0–100.0)
MPV: 10.3 fL (ref 7.5–12.5)
Monocytes Relative: 9.8 %
Neutro Abs: 3944 cells/uL (ref 1500–7800)
Neutrophils Relative %: 51.9 %
Platelets: 319 10*3/uL (ref 140–400)
RBC: 4.86 10*6/uL (ref 4.20–5.80)
RDW: 12.3 % (ref 11.0–15.0)
Total Lymphocyte: 32.3 %
WBC: 7.6 10*3/uL (ref 3.8–10.8)

## 2022-10-12 ENCOUNTER — Encounter: Payer: Self-pay | Admitting: Internal Medicine

## 2022-12-23 ENCOUNTER — Encounter: Payer: PPO | Admitting: Internal Medicine

## 2023-01-10 ENCOUNTER — Encounter: Payer: Self-pay | Admitting: Internal Medicine

## 2023-01-10 NOTE — Progress Notes (Signed)
Annual  Screening/Preventative Visit  & Comprehensive Evaluation & Examination  Future Appointments  Date Time Provider Department  01/11/2023                     cpe 10:00 AM Unk Pinto, MD [facility]  04/13/2023                    wellness 11:30 AM Alycia Rossetti, NP [facility]  01/17/2024                     cpe 10:00 AM Unk Pinto, MD [facility]              This very nice 72 y.o. MWM with  HTN, HLD, Prediabetes and Vitamin D Deficiency presents for a Screening /Preventative Visit & comprehensive evaluation and management of multiple medical co-morbidities. Patient's GERD controlled on his meds .  In 2016, Abd CT scan revealed Aortic Arthrosclerosis.         HTN predates since 1995. Patient's BP has been controlled and today's BP is at goal -  120/78. Patient denies any cardiac symptoms as chest pain, palpitations, shortness of breath, dizziness or ankle swelling.        Patient's hyperlipidemia is controlled with diet and medications. Patient denies myalgias or other medication SE's. Last lipids were at goal :  Lab Results  Component Value Date   CHOL 136 10/04/2022   HDL 49 10/04/2022   LDLCALC 69 10/04/2022   TRIG 99 10/04/2022   CHOLHDL 2.8 10/04/2022         Patient has hx/o prediabetes (A1c 6.0% /2012) and patient denies reactive hypoglycemic symptoms, visual blurring, diabetic polys or paresthesias. Last A1c was  & at goal :   Lab Results  Component Value Date   HGBA1C 5.6 06/30/2022         Finally, patient has history of Vitamin D Deficiency ("34" /2012) and last vitamin D was at goal :   Lab Results  Component Value Date   VD25OH 87 06/30/2022       Current Outpatient Medications  Medication Instructions   ALPRAZolam (XANAX) 0.5 MG tablet Take 1/2 to 1 tablet 3 x /day as needed for Anxiety   VITAMIN C 500 mg  Daily   ASPIRIN 81 PO Daily   Atorvastatin 10 MG tablet TAKE 1 TAB EVERY OTHER DAY    azelastine (OPTIVAR) 0.05 % ophth  soln PLACE 1 DROP INTO BOTH EYES 2 TIMES DAILY AS NEEDED.   B Complex-C (SUPER B COMPLEX PO) Daily   VITAMIN D 5,000 u Daily   esomeprazole  40 MG capsule Take  1 capsule  every Morning    famotidine 40 MG tablet Take  1 tablet  every Night      Fexofenadine  As needed   ibuprofen 800 mg Every 8 hours PRN   olmesartan  40 MG tablet TAKE 1 TABLET EVERY DAY    PRED FORTE 1 % ophth  susp Place 1-2 drops in both eyes twice daily as needed.   PROBIOTIC BLEND Daily   zinc  50 mg Daily     Allergies  Allergen Reactions   Hismanal [Astemizole]    Latex    Penicillins Hives     Past Medical History:  Diagnosis Date   Allergy    Cancer ([place])    skin cancer melanoma and basal cell lt. cheek   Cataract    both eyes no surgery  needed   Chronic kidney disease    kidney stones   GERD (gastroesophageal reflux disease)    Hyperlipidemia    Hypertension    white coat syndrome   Prediabetes    Vitamin D deficiency      Health Maintenance  Topic Date Due   Zoster Vaccines- Shingrix (1 of 2) Never done   COVID-19 Vaccine Moderna ) 12/11/2020   COLONOSCOPY  10/07/2022   TETANUS/TDAP  07/20/2026   Pneumonia Vaccine 60+ Years old  Completed   INFUENZA VACCINE  Completed   Hepatitis C Screening  Completed   HPV VACCINES  Aged Out     Immunization History  Administered Date(s) Administered   Influenza, High Dose  10/17/2019, 10/14/2020, 10/13/2021   Influenza 10/23/2015   Moderna Sars-Covid-2 Vacc 01/24/2020, 02/21/2020, 10/16/2020   Pneumococcal -13 07/25/2017   Pneumococcal -23 06/10/2011, 07/26/2018   Td 04/14/2006   Tdap 07/20/2016   Zoster, Live 01/16/2013    Last Colon - 10/07/2017 - Dr Henrene Pastor  - Recc 5 yr f/u due Oct 2023 - overdue  Past Surgical History:  Procedure Laterality Date   BASAL CELL CARCINOMA EXCISION     COLONOSCOPY     ELBOW ARTHROSCOPY WITH TENDON RECONSTRUCTION Left 2013   GUM SURGERY Left 2017   bone graft, root canal left side   KNEE ARTHROSCOPY  Right 1982, 1999   melanoma excision     cheek lt.   SPLENECTOMY       Family History  Problem Relation Age of Onset   Alzheimer's disease Mother    Heart disease Father    Dementia Father    Colon polyps Brother    Colon cancer Neg Hx    Esophageal cancer Neg Hx    Rectal cancer Neg Hx    Stomach cancer Neg Hx      Social History   Tobacco Use   Smoking status: Never   Smokeless tobacco: Never  Substance Use Topics   Alcohol use: Yes    Alcohol/week: 5.0 standard drinks    Types: 5 Standard drinks or equivalent per week    Comment: Wine   Drug use: No      ROS Constitutional: Denies fever, chills, weight loss/gain, headaches, insomnia,  night sweats or change in appetite. Does c/o fatigue. Eyes: Denies redness, blurred vision, diplopia, discharge, itchy or watery eyes.  ENT: Denies discharge, congestion, post nasal drip, epistaxis, sore throat, earache, hearing loss, dental pain, Tinnitus, Vertigo, Sinus pain or snoring.  Cardio: Denies chest pain, palpitations, irregular heartbeat, syncope, dyspnea, diaphoresis, orthopnea, PND, claudication or edema Respiratory: denies cough, dyspnea, DOE, pleurisy, hoarseness, laryngitis or wheezing.  Gastrointestinal: Denies dysphagia, heartburn, reflux, water brash, pain, cramps, nausea, vomiting, bloating, diarrhea, constipation, hematemesis, melena, hematochezia, jaundice or hemorrhoids Genitourinary: Denies dysuria, frequency, urgency, nocturia, hesitancy, discharge, hematuria or flank pain Musculoskeletal: Denies arthralgia, myalgia, stiffness, Jt. Swelling, pain, limp or strain/sprain. Denies Falls. Skin: Denies puritis, rash, hives, warts, acne, eczema or change in skin lesion Neuro: No weakness, tremor, incoordination, spasms, paresthesia or pain Psychiatric: Denies confusion, memory loss or sensory loss. Denies Depression. Endocrine: Denies change in weight, skin, hair change, nocturia, and paresthesia, diabetic polys,  visual blurring or hyper / hypo glycemic episodes.  Heme/Lymph: No excessive bleeding, bruising or enlarged lymph nodes.   Physical Exam  BP 120/78   Pulse 63   Temp 97.6 F (36.4 C)   Resp 18   Ht 5' 8.5" (1.74 m)   Wt 188 lb 9.6 oz (85.5  kg)   SpO2 (!) 63%   BMI 28.26 kg/m   General Appearance: Well nourished and well groomed and in no apparent distress.  Eyes: PERRLA, EOMs, conjunctiva no swelling or erythema, normal fundi and vessels. Sinuses: No frontal/maxillary tenderness ENT/Mouth: EACs patent / TMs  nl. Nares clear without erythema, swelling, mucoid exudates. Oral hygiene is good. No erythema, swelling, or exudate. Tongue normal, non-obstructing. Tonsils not swollen or erythematous. Hearing normal.  Neck: Supple, thyroid not palpable. No bruits, nodes or JVD. Respiratory: Respiratory effort normal.  BS equal and clear bilateral without rales, rhonci, wheezing or stridor. Cardio: Heart sounds are normal with regular rate and rhythm and no murmurs, rubs or gallops. Peripheral pulses are normal and equal bilaterally without edema. No aortic or femoral bruits. Chest: symmetric with normal excursions and percussion.  Abdomen: Soft, with Nl bowel sounds. Nontender, no guarding, rebound, hernias, masses, or organomegaly.  Lymphatics: Non tender without lymphadenopathy.  Musculoskeletal: Full ROM all peripheral extremities, joint stability, 5/5 strength, and normal gait. Skin: Warm and dry without rashes, lesions, cyanosis, clubbing or  ecchymosis.  Neuro: Cranial nerves intact, reflexes equal bilaterally. Normal muscle tone, no cerebellar symptoms. Sensation intact.  Pysch: Alert and oriented x 3 with normal affect, insight and judgment appropriate.   Assessment and Plan  1. Annual Preventative/Screening Exam    2. Essential hypertension  - EKG 12-Lead - [place], RETROPERITNL ABD,  LTD - Urinalysis, Routine w reflex microscopic - Microalbumin / creatinine urine ratio - CBC  with Differential/Platelet - COMPLETE METABOLIC PANEL WITH GFR - Magnesium - TSH  3. Hyperlipidemia, mixed  - EKG 12-Lead - [place], RETROPERITNL ABD,  LTD - Lipid panel - TSH  4. Abnormal glucose  - EKG 12-Lead - [place], RETROPERITNL ABD,  LTD - Hemoglobin A1c - Insulin, random  5. Vitamin D deficiency  - VITAMIN D 25 Hydrox  6. Aortic atherosclerosis ([place]) by Abd CTscan on 06/15/2015  - EKG 12-Lead - [place], RETROPERITNL ABD,  LTD  7. Gastroesophageal reflux disease  - CBC with Differential/Platelet  8. Screening for colorectal cancer  - POC Hemoccult Bld/Stl   9. BPH with obstruction/lower urinary tract symptoms  - PSA  10. Prostate cancer screening  - PSA  11. Screening for ischemic heart disease  - EKG 12-Lead  12. FHx: heart disease  - EKG 12-Lead - [place], RETROPERITNL ABD,  LTD  13. Screening for AAA (aortic abdominal aneurysm)  - [place], RETROPERITNL ABD,  LTD  14. Medication management  - Urinalysis, Routine w reflex microscopic - Microalbumin / creatinine urine ratio - CBC with Differential/Platelet - COMPLETE METABOLIC PANEL WITH GFR - Magnesium - Lipid panel - TSH - Hemoglobin A1 - Insulin, random - VITAMIN D 25 Hydroxy          Patient was counseled in prudent diet, weight control to achieve/maintain BMI less than 25, BP monitoring, regular exercise and medications as discussed.  Discussed med effects and SE's. Routine screening labs and tests as requested with regular follow-up as recommended. Over 40 minutes of exam, counseling, chart review and high complex critical decision making was performed   Kirtland Bouchard, MD

## 2023-01-10 NOTE — Patient Instructions (Signed)
Due to recent changes in healthcare laws, you may see the results of your imaging and laboratory studies on MyChart before your provider has had a chance to review them.  We understand that in some cases there may be results that are confusing or concerning to you. Not all laboratory results come back in the same time frame and the provider may be waiting for multiple results in order to interpret others.  Please give [place] 48 hours in order for your provider to thoroughly review all the results before contacting the office for clarification of your results.   +++++++++++++++++++++++++++++++  Vit D  & Vit C 1,000 mg   are recommended to help protect  against the Covid-19 and other Corona viruses.    Also it's recommended  to take  Zinc 50 mg  to help  protect against the Covid-19   and best place to get  is also on [email or web address]  and don't pay more than 6-8 cents /pill !  ================================ Coronavirus (COVID-19) Are you at risk?  Are you at risk for the Coronavirus (COVID-19)?  To be considered HIGH RISK for Coronavirus (COVID-19), you have to meet the following criteria:  Traveled to [place], [place], [place], [place] or [place]; or in the [place] to [place], [place], [place]  or [place]; and have fever, cough, and shortness of breath within the last 2 weeks of travel OR Been in close contact with a person diagnosed with COVID-19 within the last 2 weeks and have  fever, cough,and shortness of breath  IF YOU DO NOT MEET THESE CRITERIA, YOU ARE CONSIDERED LOW RISK FOR COVID-19.  What to do if you are HIGH RISK for COVID-19?  If you are having a medical emergency, call 911. Seek medical care right away. Before you go to a doctor's office, urgent care or emergency department,  call ahead and tell them about your recent travel, contact with someone diagnosed with COVID-19   and your symptoms.  You should receive instructions from your physician's office regarding  next steps of care.  When you arrive at healthcare provider, tell the healthcare staff immediately you have returned from  visiting [place], [place], [place], [place] or [place]; or traveled in the [place] to [place], [place],  [place] or [place] in the last two weeks or you have been in close contact with a person diagnosed with  COVID-19 in the last 2 weeks.   Tell the health care staff about your symptoms: fever, cough and shortness of breath. After you have been seen by a medical provider, you will be either: Tested for (COVID-19) and discharged home on quarantine except to seek medical care if  symptoms worsen, and asked to  Stay home and avoid contact with others until you get your results (4-5 days)  Avoid travel on public transportation if possible (such as bus, train, or airplane) or Sent to the Emergency Department by EMS for evaluation, COVID-19 testing  and  possible admission depending on your condition and test results.  What to do if you are LOW RISK for COVID-19?  Reduce your risk of any infection by using the same precautions used for avoiding the common cold or flu:  Wash your hands often with soap and warm water for at least 20 seconds.  If soap and water are not readily available,  use an alcohol-based hand sanitizer with at least 60% alcohol.  If coughing or sneezing, cover your mouth and nose by coughing  or sneezing into the elbow areas of your shirt or coat,  into a tissue or into your sleeve (not your hands). Avoid shaking hands with others and consider head nods or verbal greetings only. Avoid touching your eyes, nose, or mouth with unwashed hands.  Avoid close contact with people who are sick. Avoid places or events with large numbers of people in one location, like concerts or sporting events. Carefully consider travel plans you have or are making. If you are planning any travel outside or inside the [place], visit the CDC's Travelers' Health webpage for  the latest health notices. If you have some symptoms but not all symptoms, continue to monitor at home and seek medical attention  if your symptoms worsen. If you are having a medical emergency, call 911. >>>>>>>>>>>>>>>>>>>>>>>>>>>>>>>>>>>>>>>>>>>>>>>>>>> We Do NOT Approve of [employer] > > > > > > > > > > > > > > > > > > > > > > > > > > > > > > > > > > >  > >    Preventive Care for Adults  A healthy lifestyle and preventive care can promote health and wellness. Preventive health guidelines for men include the following key practices: A routine yearly physical is a good way to check with your health care provider about your health and preventative screening. It is a chance to share any concerns and updates on your health and to receive a thorough exam. Visit your dentist for a routine exam and preventative care every 6 months. Brush your teeth twice a day and floss once a day. Good oral hygiene prevents tooth decay and gum disease. The frequency of eye exams is based on your age, health, family medical history, use of contact lenses, and other factors. Follow your health care provider's recommendations for frequency of eye exams. Eat a healthy diet. Foods such as vegetables, fruits, whole grains, low-fat dairy products, and lean protein foods contain the nutrients you need without too many calories. Decrease your intake of foods high in solid fats, added sugars, and salt. Eat the right amount of calories for you. Get information about a proper diet from your health care provider, if necessary. Regular physical exercise is one of the most important things you can do for your health. Most adults should get at least 150 minutes of moderate-intensity exercise (any activity that increases your heart rate and causes you to sweat) each week. In addition, most adults need muscle-strengthening exercises on 2 or more days a week. Maintain a healthy weight. The body mass index (BMI) is a screening  tool to identify possible weight problems. It provides an estimate of body fat based on height and weight. Your health care provider can find your BMI and can help you achieve or maintain a healthy weight. For adults 20 years and older: A BMI below 18.5 is considered underweight. A BMI of 18.5 to 24.9 is normal. A BMI of 25 to 29.9 is considered overweight. A BMI of 30 and above is considered obese. Maintain normal blood lipids and cholesterol levels by exercising and minimizing your intake of saturated fat. Eat a balanced diet with plenty of fruit and vegetables. Blood tests for lipids and cholesterol should begin at age 58 and be repeated every 5 years. If your lipid or cholesterol levels are high, you are over 50, or you are at high risk for heart disease, you may need your cholesterol levels checked more frequently. Ongoing high lipid and cholesterol levels should be  treated with medicines if diet and exercise are not working. If you smoke, find out from your health care provider how to quit. If you do not use tobacco, do not start. Lung cancer screening is recommended for adults aged 47-80 years who are at high risk for developing lung cancer because of a history of smoking. A yearly low-dose CT scan of the lungs is recommended for people who have at least a 30-pack-year history of smoking and are a current smoker or have quit within the past 15 years. A pack year of smoking is smoking an average of 1 pack of cigarettes a day for 1 year (for example: 1 pack a day for 30 years or 2 packs a day for 15 years). Yearly screening should continue until the smoker has stopped smoking for at least 15 years. Yearly screening should be stopped for people who develop a health problem that would prevent them from having lung cancer treatment. If you choose to drink alcohol, do not have more than 2 drinks per day. One drink is considered to be 12 ounces (355 mL) of beer, 5 ounces (148 mL) of wine, or 1.5 ounces (44  mL) of liquor. Avoid use of street drugs. Do not share needles with anyone. Ask for help if you need support or instructions about stopping the use of drugs. High blood pressure causes heart disease and increases the risk of stroke. Your blood pressure should be checked at least every 1-2 years. Ongoing high blood pressure should be treated with medicines, if weight loss and exercise are not effective. If you are 33-66 years old, ask your health care provider if you should take aspirin to prevent heart disease. Diabetes screening involves taking a blood sample to check your fasting blood sugar level. Testing should be considered at a younger age or be carried out more frequently if you are overweight and have at least 1 risk factor for diabetes. Colorectal cancer can be detected and often prevented. Most routine colorectal cancer screening begins at the age of 21 and continues through age 56. However, your health care provider may recommend screening at an earlier age if you have risk factors for colon cancer. On a yearly basis, your health care provider may provide home test kits to check for hidden blood in the stool. Use of a small camera at the end of a tube to directly examine the colon (sigmoidoscopy or colonoscopy) can detect the earliest forms of colorectal cancer. Talk to your health care provider about this at age 47, when routine screening begins. Direct exam of the colon should be repeated every 5-10 years through age 10, unless early forms of precancerous polyps or small growths are found. Hepatitis C blood testing is recommended for all people born from [birth year] through 1964 and any individual with known risks for hepatitis C. Screening for abdominal aortic aneurysm (AAA)  by ultrasound is recommended for people who have history of high blood pressure or who are current or former smokers. Healthy men should  receive prostate-specific antigen (PSA) blood tests as part of routine cancer screening.  Talk with your health care provider about prostate cancer screening. Testicular cancer screening is  recommended for adult males. Screening includes self-exam, a health care provider exam, and other screening tests. Consult with your health care provider about any symptoms you have or any concerns you have about testicular cancer. Use sunscreen. Apply sunscreen liberally and repeatedly throughout the day. You should seek shade when your shadow is shorter than  you. Protect yourself by wearing long sleeves, pants, a wide-brimmed hat, and sunglasses year round, whenever you are outdoors. Once a month, do a whole-body skin exam, using a mirror to look at the skin on your back. Tell your health care provider about new moles, moles that have irregular borders, moles that are larger than a pencil eraser, or moles that have changed in shape or color. Stay current with required vaccines (immunizations). Influenza vaccine. All adults should be immunized every year. Tetanus, diphtheria, and acellular pertussis (Td, Tdap) vaccine. An adult who has not previously received Tdap or who does not know his vaccine status should receive 1 dose of Tdap. This initial dose should be followed by tetanus and diphtheria toxoids (Td) booster doses every 10 years. Adults with an unknown or incomplete history of completing a 3-dose immunization series with Td-containing vaccines should begin or complete a primary immunization series including a Tdap dose. Adults should receive a Td booster every 10 years. Zoster vaccine. One dose is recommended for adults aged 60 years or older unless certain conditions are present.  PREVNAR - Pneumococcal 13-valent conjugate (PCV13) vaccine. When indicated, a person who is uncertain of his immunization history and has no record of immunization should receive the PCV13 vaccine. An adult aged 19 years or older who has certain medical conditions and has not been previously immunized should receive 1  dose of PCV13 vaccine. This PCV13 should be followed with a dose of pneumococcal polysaccharide (PPSV23) vaccine. The PPSV23 vaccine dose should be obtained 1 or more year(s)after the dose of PCV13 vaccine. An adult aged 19 years or older who has certain medical conditions and previously received 1 or more doses of PPSV23 vaccine should receive 1 dose of PCV13. The PCV13 vaccine dose should be obtained 1 or more years after the last PPSV23 vaccine dose.  PNEUMOVAX - Pneumococcal polysaccharide (PPSV23) vaccine. When PCV13 is also indicated, PCV13 should be obtained first. All adults aged 65 years and older should be immunized. An adult younger than age 65 years who has certain medical conditions should be immunized. Any person who resides in a nursing home or long-term care facility should be immunized. An adult smoker should be immunized. People with an immunocompromised condition and certain other conditions should receive both PCV13 and PPSV23 vaccines. People with human immunodeficiency virus (HIV) infection should be immunized as soon as possible after diagnosis. Immunization during chemotherapy or radiation therapy should be avoided. Routine use of PPSV23 vaccine is not recommended for American Indians, [place] Natives, or people younger than 65 years unless there are medical conditions that require PPSV23 vaccine. When indicated, people who have unknown immunization and have no record of immunization should receive PPSV23 vaccine. One-time revaccination 5 years after the first dose of PPSV23 is recommended for people aged 19-64 years who have chronic kidney failure, nephrotic syndrome, asplenia, or immunocompromised conditions. People who received 1-2 doses of PPSV23 before age 65 years should receive another dose of PPSV23 vaccine at age 65 years or later if at least 5 years have passed since the previous dose. Doses of PPSV23 are not needed for people immunized with PPSV23 at or after age 65  years.  Hepatitis A vaccine. Adults who wish to be protected from this disease, have certain high-risk conditions, work with hepatitis A-infected animals, work in hepatitis A research labs, or travel to or work in countries with a high rate of hepatitis A should be immunized. Adults who were previously unvaccinated and who anticipate close contact   with an international adoptee during the first 60 days after arrival in the [place] from a country with a high rate of hepatitis A should be immunized.  Hepatitis B vaccine. Adults should be immunized if they wish to be protected from this disease, have certain high-risk conditions, may be exposed to blood or other infectious body fluids, are household contacts or sex partners of hepatitis B positive people, are clients or workers in certain care facilities, or travel to or work in countries with a high rate of hepatitis B.  Preventive Service / Frequency  Ages 62 and over Blood pressure check. Lipid and cholesterol check. Lung cancer screening. / Every year if you are aged 2-80 years and have a 30-pack-year history of smoking and currently smoke or have quit within the past 15 years. Yearly screening is stopped once you have quit smoking for at least 15 years or develop a health problem that would prevent you from having lung cancer treatment. Fecal occult blood test (FOBT) of stool. You may not have to do this test if you get a colonoscopy every 10 years. Flexible sigmoidoscopy** or colonoscopy.** / Every 5 years for a flexible sigmoidoscopy or every 10 years for a colonoscopy beginning at age 44 and continuing until age 49. Hepatitis C blood test.** / For all people born from [birth year] through 1964 and any individual with known risks for hepatitis C. Abdominal aortic aneurysm (AAA) screening./ Screening current or former smokers or have Hypertension. Skin self-exam. / Monthly. Influenza vaccine. / Every year. Tetanus, diphtheria, and acellular  pertussis (Tdap/Td) vaccine.** / 1 dose of Td every 10 years.  Zoster vaccine.** / 1 dose for adults aged 44 years or older.         Pneumococcal 13-valent conjugate (PCV13) vaccine.   Pneumococcal polysaccharide (PPSV23) vaccine.   Hepatitis A vaccine.** / Consult your health care provider. Hepatitis B vaccine.** / Consult your health care provider. Screening for abdominal aortic aneurysm (AAA)  by ultrasound is recommended for people who have history of high blood pressure or who are current or former smokers. ++++++++++ Recommend Adult Low Dose Aspirin or  coated  Aspirin 81 mg daily  To reduce risk of Colon Cancer 40 %,  Skin Cancer 26 % ,  Malignant Melanoma 46%  and  Pancreatic cancer 60% ++++++++++++++++++++++ Vitamin D goal  is between 70-100.  Please make sure that you are taking your Vitamin D as directed.  It is very important as a natural anti-inflammatory  helping hair, skin, and nails, as well as reducing stroke and heart attack risk.  It helps your bones and helps with mood. It also decreases numerous cancer risks so please take it as directed.  Low Vit D is associated with a 200-300% higher risk for CANCER  and 200-300% higher risk for HEART   ATTACK  &  STROKE.   .....................................Marland Kitchen It is also associated with higher death rate at younger ages,  autoimmune diseases like Rheumatoid arthritis, Lupus, Multiple Sclerosis.    Also many other serious conditions, like depression, Alzheimer's Dementia, infertility, muscle aches, fatigue, fibromyalgia - just to name a few. ++++++++++++++++++++++ Recommend the book "The END of DIETING" by Dr Excell Seltzer  & the book "The END of DIABETES " by Dr Excell Seltzer At [email or web address] - get book & Audio CD's    Being diabetic has a  300% increased risk for heart attack, stroke, cancer, and alzheimer- type vascular dementia. It is very important that you work harder with diet by  avoiding all foods that are white. Avoid  white rice (brown & wild rice is OK), white potatoes (sweetpotatoes in moderation is OK), White bread or wheat bread or anything made out of white flour like bagels, donuts, rolls, buns, biscuits, cakes, pastries, cookies, pizza crust, and pasta (made from white flour & egg whites) - vegetarian pasta or spinach or wheat pasta is OK. Multigrain breads like Arnold's or Pepper[employer], or multigrain sandwich thins or flatbreads.  Diet, exercise and weight loss can reverse and cure diabetes in the early stages.  Diet, exercise and weight loss is very important in the control and prevention of complications of diabetes which affects every system in your body, ie. Brain - dementia/stroke, eyes - glaucoma/blindness, heart - heart attack/heart failure, kidneys - dialysis, stomach - gastric paralysis, intestines - malabsorption, nerves - severe painful neuritis, circulation - gangrene & loss of a leg(s), and finally cancer and Alzheimers.    I recommend avoid fried & greasy foods,  sweets/candy, white rice (brown or wild rice or Quinoa is OK), white potatoes (sweet potatoes are OK) - anything made from white flour - bagels, doughnuts, rolls, buns, biscuits,white and wheat breads, pizza crust and traditional pasta made of white flour & egg white(vegetarian pasta or spinach or wheat pasta is OK).  Multi-grain bread is OK - like multi-grain flat bread or sandwich thins. Avoid alcohol in excess. Exercise is also important.    Eat all the vegetables you want - avoid meat, especially red meat and dairy - especially cheese.  Cheese is the most concentrated form of trans-fats which is the worst thing to clog up our arteries. Veggie cheese is OK which can be found in the fresh produce section at [employer] or [employer] or [employer]  ++++++++++++++++++++++ DASH Eating Plan  DASH stands for "Dietary Approaches to Stop Hypertension."   The DASH eating plan is a healthy eating plan that has been shown to reduce high  blood pressure (hypertension). Additional health benefits may include reducing the risk of type 2 diabetes mellitus, heart disease, and stroke. The DASH eating plan may also help with weight loss. WHAT DO I NEED TO KNOW ABOUT THE DASH EATING PLAN? For the DASH eating plan, you will follow these general guidelines: Choose foods with a percent daily value for sodium of less than 5% (as listed on the food label). Use salt-free seasonings or herbs instead of table salt or sea salt. Check with your health care provider or pharmacist before using salt substitutes. Eat lower-sodium products, often labeled as "lower sodium" or "no salt added." Eat fresh foods. Eat more vegetables, fruits, and low-fat dairy products. Choose whole grains. Look for the word "whole" as the first word in the ingredient list. Choose fish  Limit sweets, desserts, sugars, and sugary drinks. Choose heart-healthy fats. Eat veggie cheese  Eat more home-cooked food and less restaurant, buffet, and fast food. Limit fried foods. Cook foods using methods other than frying. Limit canned vegetables. If you do use them, rinse them well to decrease the sodium. When eating at a restaurant, ask that your food be prepared with less salt, or no salt if possible.                      WHAT FOODS CAN I EAT? Read Dr Fara Olden Fuhrman's books on The End of Dieting & The End of Diabetes  Grains Whole grain or whole wheat bread. Brown rice. Whole grain or whole wheat pasta. Quinoa, bulgur, and  whole grain cereals. Low-sodium cereals. Corn or whole wheat flour tortillas. Whole grain cornbread. Whole grain crackers. Low-sodium crackers.  Vegetables Fresh or frozen vegetables (raw, steamed, roasted, or grilled). Low-sodium or reduced-sodium tomato and vegetable juices. Low-sodium or reduced-sodium tomato sauce and paste. Low-sodium or reduced-sodium canned vegetables.   Fruits All fresh, canned (in natural juice), or frozen fruits.  Protein  Products  All fish and seafood.  Dried beans, peas, or lentils. Unsalted nuts and seeds. Unsalted canned beans.  Dairy Low-fat dairy products, such as skim or 1% milk, 2% or reduced-fat cheeses, low-fat ricotta or cottage cheese, or plain low-fat yogurt. Low-sodium or reduced-sodium cheeses.  Fats and Oils Tub margarines without trans fats. Light or reduced-fat mayonnaise and salad dressings (reduced sodium). Avocado. Safflower, olive, or canola oils. Natural peanut or almond butter.  Other Unsalted popcorn and pretzels. The items listed above may not be a complete list of recommended foods or beverages. Contact your dietitian for more options.  ++++++++++++++++++++  WHAT FOODS ARE NOT RECOMMENDED? Grains/ White flour or wheat flour White bread. White pasta. White rice. Refined cornbread. Bagels and croissants. Crackers that contain trans fat.  Vegetables  Creamed or fried vegetables. Vegetables in a . Regular canned vegetables. Regular canned tomato sauce and paste. Regular tomato and vegetable juices.  Fruits Dried fruits. Canned fruit in light or heavy syrup. Fruit juice.  Meat and Other Protein Products Meat in general - RED meat & White meat.  Fatty cuts of meat. Ribs, chicken wings, all processed meats as bacon, sausage, bologna, salami, fatback, hot dogs, bratwurst and packaged luncheon meats.  Dairy Whole or 2% milk, cream, half-and-half, and cream cheese. Whole-fat or sweetened yogurt. Full-fat cheeses or blue cheese. Non-dairy creamers and whipped toppings. Processed cheese, cheese spreads, or cheese curds.  Condiments Onion and garlic salt, seasoned salt, table salt, and sea salt. Canned and packaged gravies. Worcestershire sauce. Tartar sauce. Barbecue sauce. Teriyaki sauce. Soy sauce, including reduced sodium. Steak sauce. Fish sauce. Oyster sauce. Cocktail sauce. Horseradish. Ketchup and mustard. Meat flavorings and tenderizers. Bouillon cubes. Hot sauce. Tabasco sauce.  Marinades. Taco seasonings. Relishes.  Fats and Oils Butter, stick margarine, lard, shortening and bacon fat. Coconut, palm kernel, or palm oils. Regular salad dressings.  Pickles and olives. Salted popcorn and pretzels.  The items listed above may not be a complete list of foods and beverages to avoid.

## 2023-01-11 ENCOUNTER — Encounter: Payer: Self-pay | Admitting: Internal Medicine

## 2023-01-11 ENCOUNTER — Ambulatory Visit (INDEPENDENT_AMBULATORY_CARE_PROVIDER_SITE_OTHER): Payer: PPO | Admitting: Internal Medicine

## 2023-01-11 VITALS — BP 120/78 | HR 63 | Temp 97.6°F | Resp 18 | Ht 68.5 in | Wt 188.6 lb

## 2023-01-11 DIAGNOSIS — N401 Enlarged prostate with lower urinary tract symptoms: Secondary | ICD-10-CM | POA: Diagnosis not present

## 2023-01-11 DIAGNOSIS — Z125 Encounter for screening for malignant neoplasm of prostate: Secondary | ICD-10-CM | POA: Diagnosis not present

## 2023-01-11 DIAGNOSIS — E782 Mixed hyperlipidemia: Secondary | ICD-10-CM | POA: Diagnosis not present

## 2023-01-11 DIAGNOSIS — E559 Vitamin D deficiency, unspecified: Secondary | ICD-10-CM

## 2023-01-11 DIAGNOSIS — Z79899 Other long term (current) drug therapy: Secondary | ICD-10-CM | POA: Diagnosis not present

## 2023-01-11 DIAGNOSIS — N138 Other obstructive and reflux uropathy: Secondary | ICD-10-CM

## 2023-01-11 DIAGNOSIS — Z136 Encounter for screening for cardiovascular disorders: Secondary | ICD-10-CM

## 2023-01-11 DIAGNOSIS — K219 Gastro-esophageal reflux disease without esophagitis: Secondary | ICD-10-CM

## 2023-01-11 DIAGNOSIS — I10 Essential (primary) hypertension: Secondary | ICD-10-CM | POA: Diagnosis not present

## 2023-01-11 DIAGNOSIS — R7309 Other abnormal glucose: Secondary | ICD-10-CM

## 2023-01-11 DIAGNOSIS — I700 Atherosclerosis of aorta: Secondary | ICD-10-CM

## 2023-01-11 DIAGNOSIS — Z1211 Encounter for screening for malignant neoplasm of colon: Secondary | ICD-10-CM

## 2023-01-11 DIAGNOSIS — Z0001 Encounter for general adult medical examination with abnormal findings: Secondary | ICD-10-CM

## 2023-01-11 DIAGNOSIS — Z8249 Family history of ischemic heart disease and other diseases of the circulatory system: Secondary | ICD-10-CM

## 2023-01-11 DIAGNOSIS — Z0000 Encounter for general adult medical examination without abnormal findings: Secondary | ICD-10-CM

## 2023-01-12 ENCOUNTER — Encounter: Payer: Self-pay | Admitting: Internal Medicine

## 2023-01-12 LAB — CBC WITH DIFFERENTIAL/PLATELET
Absolute Monocytes: 779 cells/uL (ref 200–950)
Basophils Absolute: 59 cells/uL (ref 0–200)
Basophils Relative: 0.9 %
Eosinophils Absolute: 370 cells/uL (ref 15–500)
Eosinophils Relative: 5.6 %
HCT: 44.3 % (ref 38.5–50.0)
Hemoglobin: 15.4 g/dL (ref 13.2–17.1)
Lymphs Abs: 1921 cells/uL (ref 850–3900)
MCH: 31.9 pg (ref 27.0–33.0)
MCHC: 34.8 g/dL (ref 32.0–36.0)
MCV: 91.7 fL (ref 80.0–100.0)
MPV: 10.1 fL (ref 7.5–12.5)
Monocytes Relative: 11.8 %
Neutro Abs: 3472 cells/uL (ref 1500–7800)
Neutrophils Relative %: 52.6 %
Platelets: 295 10*3/uL (ref 140–400)
RBC: 4.83 10*6/uL (ref 4.20–5.80)
RDW: 12.4 % (ref 11.0–15.0)
Total Lymphocyte: 29.1 %
WBC: 6.6 10*3/uL (ref 3.8–10.8)

## 2023-01-12 LAB — MICROSCOPIC MESSAGE

## 2023-01-12 LAB — MICROALBUMIN / CREATININE URINE RATIO
Creatinine, Urine: 160 mg/dL (ref 20–320)
Microalb Creat Ratio: 6 mcg/mg creat (ref <30)
Microalb, Ur: 0.9 mg/dL

## 2023-01-12 LAB — COMPLETE METABOLIC PANEL WITH GFR
AG Ratio: 1.9 (calc) (ref 1.0–2.5)
ALT: 28 U/L (ref 9–46)
AST: 23 U/L (ref 10–35)
Albumin: 4.5 g/dL (ref 3.6–5.1)
Alkaline phosphatase (APISO): 70 U/L (ref 35–144)
BUN: 21 mg/dL (ref 7–25)
CO2: 24 mmol/L (ref 20–32)
Calcium: 9.9 mg/dL (ref 8.6–10.3)
Chloride: 105 mmol/L (ref 98–110)
Creat: 1.03 mg/dL (ref 0.70–1.28)
Globulin: 2.4 g/dL (calc) (ref 1.9–3.7)
Glucose, Bld: 102 mg/dL — ABNORMAL HIGH (ref 65–99)
Potassium: 4.3 mmol/L (ref 3.5–5.3)
Sodium: 139 mmol/L (ref 135–146)
Total Bilirubin: 0.6 mg/dL (ref 0.2–1.2)
Total Protein: 6.9 g/dL (ref 6.1–8.1)
eGFR: 78 mL/min/{1.73_m2} (ref >=60)

## 2023-01-12 LAB — LIPID PANEL
Cholesterol: 142 mg/dL (ref <200)
HDL: 55 mg/dL (ref >=40)
LDL Cholesterol (Calc): 68 mg/dL (calc)
Non-HDL Cholesterol (Calc): 87 mg/dL (calc) (ref <130)
Total CHOL/HDL Ratio: 2.6 (calc) (ref <5.0)
Triglycerides: 106 mg/dL (ref <150)

## 2023-01-12 LAB — MAGNESIUM: Magnesium: 2.2 mg/dL (ref 1.5–2.5)

## 2023-01-12 LAB — URINALYSIS, ROUTINE W REFLEX MICROSCOPIC
Bacteria, UA: NONE SEEN /HPF
Bilirubin Urine: NEGATIVE
Glucose, UA: NEGATIVE
Hgb urine dipstick: NEGATIVE
Hyaline Cast: NONE SEEN /LPF
Ketones, ur: NEGATIVE
Nitrite: NEGATIVE
Protein, ur: NEGATIVE
Specific Gravity, Urine: 1.021 (ref 1.001–1.035)
Squamous Epithelial / HPF: NONE SEEN /HPF (ref <=5)
pH: 6 (ref 5.0–8.0)

## 2023-01-12 LAB — PSA: PSA: 0.57 ng/mL (ref <=4.00)

## 2023-01-12 LAB — TSH: TSH: 1.29 mIU/L (ref 0.40–4.50)

## 2023-01-12 LAB — HEMOGLOBIN A1C
Hgb A1c MFr Bld: 6 % of total Hgb — ABNORMAL HIGH (ref <5.7)
Mean Plasma Glucose: 126 mg/dL
eAG (mmol/L): 7 mmol/L

## 2023-01-12 LAB — INSULIN, RANDOM: Insulin: 22.2 u[IU]/mL — ABNORMAL HIGH

## 2023-01-12 LAB — VITAMIN D 25 HYDROXY (VIT D DEFICIENCY, FRACTURES): Vit D, 25-Hydroxy: 77 ng/mL (ref 30–100)

## 2023-01-12 NOTE — Progress Notes (Signed)
<><><><><><><><><><><><><><><><><><><><><><><><><><><><><><><><><> <><><><><><><><><><><><><><><><><><><><><><><><><><><><><><><><><> - Test results slightly outside the reference range are not unusual. If there is anything important, I will review this with you,  otherwise it is considered normal test values.  If you have further questions,  please do not hesitate to contact me at the office or via My Chart.  <><><><><><><><><><><><><><><><><><><><><><><><><><><><><><><><><> <><><><><><><><><><><><><><><><><><><><><><><><><><><><><><><><><>  -  A1c = 6.0%  Blood sugar and A1c are elevated in the borderline and                                                       early or pre-diabetes range which has the same   300% increased risk for heart attack, stroke, cancer and                                        alzheimer- type vascular dementia as full blown diabetes.   But the good news is that diet, exercise with weight loss can                                                                            cure the early diabetes at this point.   -  It is very important that you work harder with diet by                                                   avoiding all foods that are white except chicken,fish & calliflower.  - Avoid white rice  (brown & wild rice is OK),   - Avoid white potatoes  (sweet potatoes in moderation is OK),   White bread or wheat bread or anything made out of   white flour like bagels, donuts, rolls, buns, biscuits, cakes,  - pastries, cookies, pizza crust, and pasta (made from  white flour & egg whites)   - vegetarian pasta or spinach or wheat pasta is OK.  - Multigrain breads like Arnold's, Pepperidge Farm or   multigrain sandwich thins or high fiber breads like                                      Eureka bread or "Dave's Killer" breads that are                                                                     4 to 5 grams fiber per slice !  are best.     Diet, exercise and weight loss can reverse and cure diabetes in the early  stages.    - Diet, exercise and weight loss is very important in the   control and prevention of complications of diabetes which  affects every system in your body, ie.   -Brain - dementia/stroke,  - eyes - glaucoma/blindness,  - heart - heart attack/heart failure,  - kidneys - dialysis,  - stomach - gastric paralysis,  - intestines - malabsorption,  - nerves - severe painful neuritis,  - circulation - gangrene & loss of a leg(s)  - and finally  . . . . . . . . . . . . . . . . . .    - cancer and Alzheimers. <><><><><><><><><><><><><><><><><><><><><><><><><><><><><><><><><>  -  PSA - Low - No Prostate Cancer  - Great !  <><><><><><><><><><><><><><><><><><><><><><><><><><><><><><><><><>  -  Total Chol = 142  &  LDL Chol = 68    - Both     Excellent   - Very low risk for Heart Attack  / Stroke <><><><><><><><><><><><><><><><><><><><><><><><><><><><><><><><><>  -  Vitamin D= 77  -  Excellent    -   Please keep dose same  <><><><><><><><><><><><><><><><><><><><><><><><><><><><><><><><><>  -  All Else - CBC - Kidneys - Electrolytes - Liver - Magnesium & Thyroid    - all  Normal / OK <><><><><><><><><><><><><><><><><><><><><><><><><><><><><><><><><> <><><><><><><><><><><><><><><><><><><><><><><><><><><><><><><><><>

## 2023-02-04 ENCOUNTER — Ambulatory Visit (AMBULATORY_SURGERY_CENTER): Payer: PPO | Admitting: *Deleted

## 2023-02-04 VITALS — Ht 67.0 in | Wt 180.0 lb

## 2023-02-04 DIAGNOSIS — Z86010 Personal history of colonic polyps: Secondary | ICD-10-CM

## 2023-02-04 MED ORDER — NA SULFATE-K SULFATE-MG SULF 17.5-3.13-1.6 GM/177ML PO SOLN: 1.0000 | Freq: Once | ORAL | 0 refills | Status: AC

## 2023-02-04 NOTE — Progress Notes (Signed)
Virtual pre visit completed over telephone.  Instructions forwarded through MyChart per request.   No egg or soy allergy known to patient  No issues known to pt with past sedation with any surgeries or procedures Patient denies ever being told they had issues or difficulty with intubation  No FH of Malignant Hyperthermia Pt is not on diet pills Pt is not on  home 02  Pt is not on blood thinners  Pt denies issues with constipation  No A fib or A flutter Have any cardiac testing pending-  NO Pt instructed to use Single[employer] or GoodRx for a price reduction on prep

## 2023-02-07 ENCOUNTER — Encounter: Payer: Self-pay | Admitting: Internal Medicine

## 2023-02-11 DIAGNOSIS — M25552 Pain in left hip: Secondary | ICD-10-CM | POA: Diagnosis not present

## 2023-02-11 DIAGNOSIS — M545 Low back pain, unspecified: Secondary | ICD-10-CM | POA: Diagnosis not present

## 2023-02-13 ENCOUNTER — Other Ambulatory Visit: Payer: Self-pay | Admitting: Internal Medicine

## 2023-02-13 DIAGNOSIS — E782 Mixed hyperlipidemia: Secondary | ICD-10-CM

## 2023-02-13 DIAGNOSIS — I10 Essential (primary) hypertension: Secondary | ICD-10-CM

## 2023-02-13 MED ORDER — OLMESARTAN MEDOXOMIL 40 MG PO TABS: ORAL_TABLET | ORAL | 3 refills | Status: DC

## 2023-02-13 MED ORDER — ATORVASTATIN CALCIUM 10 MG PO TABS: ORAL_TABLET | ORAL | 3 refills | Status: DC

## 2023-02-15 ENCOUNTER — Encounter: Payer: Self-pay | Admitting: Internal Medicine

## 2023-02-22 DIAGNOSIS — Z86018 Personal history of other benign neoplasm: Secondary | ICD-10-CM | POA: Diagnosis not present

## 2023-02-22 DIAGNOSIS — Z85828 Personal history of other malignant neoplasm of skin: Secondary | ICD-10-CM | POA: Diagnosis not present

## 2023-02-22 DIAGNOSIS — L578 Other skin changes due to chronic exposure to nonionizing radiation: Secondary | ICD-10-CM | POA: Diagnosis not present

## 2023-02-22 DIAGNOSIS — L821 Other seborrheic keratosis: Secondary | ICD-10-CM | POA: Diagnosis not present

## 2023-02-22 DIAGNOSIS — Z85820 Personal history of malignant melanoma of skin: Secondary | ICD-10-CM | POA: Diagnosis not present

## 2023-02-22 DIAGNOSIS — B079 Viral wart, unspecified: Secondary | ICD-10-CM | POA: Diagnosis not present

## 2023-02-22 DIAGNOSIS — D225 Melanocytic nevi of trunk: Secondary | ICD-10-CM | POA: Diagnosis not present

## 2023-02-22 DIAGNOSIS — L814 Other melanin hyperpigmentation: Secondary | ICD-10-CM | POA: Diagnosis not present

## 2023-02-22 DIAGNOSIS — L720 Epidermal cyst: Secondary | ICD-10-CM | POA: Diagnosis not present

## 2023-02-23 DIAGNOSIS — M5416 Radiculopathy, lumbar region: Secondary | ICD-10-CM | POA: Diagnosis not present

## 2023-03-02 ENCOUNTER — Encounter: Payer: Self-pay | Admitting: Internal Medicine

## 2023-03-02 ENCOUNTER — Ambulatory Visit (AMBULATORY_SURGERY_CENTER): Payer: PPO | Admitting: Internal Medicine

## 2023-03-02 VITALS — BP 118/53 | HR 58 | Temp 97.3°F | Resp 15 | Ht 68.5 in | Wt 176.8 lb

## 2023-03-02 DIAGNOSIS — K635 Polyp of colon: Secondary | ICD-10-CM | POA: Diagnosis not present

## 2023-03-02 DIAGNOSIS — Z86010 Personal history of colonic polyps: Secondary | ICD-10-CM

## 2023-03-02 DIAGNOSIS — Z09 Encounter for follow-up examination after completed treatment for conditions other than malignant neoplasm: Secondary | ICD-10-CM | POA: Diagnosis not present

## 2023-03-02 DIAGNOSIS — D122 Benign neoplasm of ascending colon: Secondary | ICD-10-CM | POA: Diagnosis not present

## 2023-03-02 DIAGNOSIS — R7303 Prediabetes: Secondary | ICD-10-CM | POA: Diagnosis not present

## 2023-03-02 DIAGNOSIS — E785 Hyperlipidemia, unspecified: Secondary | ICD-10-CM | POA: Diagnosis not present

## 2023-03-02 MED ORDER — SODIUM CHLORIDE 0.9 % IV SOLN: 500.0000 mL | Freq: Once | INTRAVENOUS | Status: DC

## 2023-03-02 NOTE — Patient Instructions (Addendum)
Recommendation:  - Repeat colonoscopy in 5 years for surveillance.                           - Patient has a contact number available for                            emergencies. The signs and symptoms of potential                            delayed complications were discussed with the                            patient. Return to normal activities tomorrow.                            Written discharge instructions were provided to the                            patient.                           - Resume previous diet.                           - Continue present medications.                           - Await pathology results.  Handouts on hemorrhoids, polyps and diverticulosis given.   YOU HAD AN ENDOSCOPIC PROCEDURE TODAY AT [facility]:   Refer to the procedure report that was given to you for any specific questions about what was found during the examination.  If the procedure report does not answer your questions, please call your gastroenterologist to clarify.  If you requested that your care partner not be given the details of your procedure findings, then the procedure report has been included in a sealed envelope for you to review at your convenience later.  YOU SHOULD EXPECT: Some feelings of bloating in the abdomen. Passage of more gas than usual.  Walking can help get rid of the air that was put into your GI tract during the procedure and reduce the bloating. If you had a lower endoscopy (such as a colonoscopy or flexible sigmoidoscopy) you may notice spotting of blood in your stool or on the toilet paper. If you underwent a bowel prep for your procedure, you may not have a normal bowel movement for a few days.  Please Note:  You might notice some irritation and congestion in your nose or some drainage.  This is from the oxygen used during your procedure.  There is no need for concern and it should clear up in a day or so.  SYMPTOMS TO REPORT  IMMEDIATELY:  Following lower endoscopy (colonoscopy or flexible sigmoidoscopy):  Excessive amounts of blood in the stool  Significant tenderness or worsening of abdominal pains  Swelling of the abdomen that is new, acute  Fever of 100F or higher  For urgent or emergent issues, a gastroenterologist can be reached at any hour by calling [phone]. Do not use MyChart messaging for urgent concerns.   DIET:  We  do recommend a small meal at first, but then you may proceed to your regular diet.  Drink plenty of fluids but you should avoid alcoholic beverages for 24 hours.  ACTIVITY:  You should plan to take it easy for the rest of today and you should NOT DRIVE or use heavy machinery until tomorrow (because of the sedation medicines used during the test).    FOLLOW UP: Our staff will call the number listed on your records the next business day following your procedure.  We will call around 7:15- 8:00 am to check on you and address any questions or concerns that you may have regarding the information given to you following your procedure. If we do not reach you, we will leave a message.     If any biopsies were taken you will be contacted by phone or by letter within the next 1-3 weeks.  Please call us at [phone] if you have not heard about the biopsies in 3 weeks.   SIGNATURES/CONFIDENTIALITY: You and/or your care partner have signed paperwork which will be entered into your electronic medical record.  These signatures attest to the fact that that the information above on your After Visit Summary has been reviewed and is understood.  Full responsibility of the confidentiality of this discharge information lies with you and/or your care-partner.

## 2023-03-02 NOTE — Progress Notes (Signed)
HISTORY OF PRESENT ILLNESS:  Barry Flynn is a 72 y.o. male With a history of both adenomatous and sessile serrated polyps.  Previous colonoscopy examinations 2002, 2008, 2018.  Now for surveillance  REVIEW OF SYSTEMS:  All non-GI ROS negative except for  Past Medical History:  Diagnosis Date   Allergy    Cancer (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   SPLENECTOMY      Social History KENNY USMAN  reports that he has never smoked. He has never used smokeless tobacco. He reports current alcohol use of about 5.0 standard drinks of alcohol per week. He reports that he does not use drugs.  family history includes Alzheimer's disease in his mother; Colon polyps in his brother; Dementia in his father; Heart disease in his father.  Allergies  Allergen Reactions   Hismanal [Astemizole]    Latex    Penicillins Hives       PHYSICAL EXAMINATION: Vital signs: BP (!) 146/78   Pulse 77   Temp (!) 97.3 F (36.3 C) (Temporal)   Ht 5' 8.5" (1.74 m)   Wt 176 lb 12.8 oz (80.2 kg)   SpO2 94%   BMI 26.49 kg/m  General: Well-developed, well-nourished, no acute distress HEENT: Sclerae are anicteric, conjunctiva pink. Oral mucosa intact Lungs: Clear Heart: Regular Abdomen: soft, nontender, nondistended, no obvious ascites, no peritoneal signs, normal bowel sounds. No organomegaly. Extremities: No edema Psychiatric: alert and oriented x3. Cooperative     ASSESSMENT:  History of adenomatous  and sessile serrated polyps   PLAN:   Surveillance colonoscopy

## 2023-03-02 NOTE — Progress Notes (Signed)
Pt's states no medical or surgical changes since previsit or office visit. 

## 2023-03-02 NOTE — Progress Notes (Signed)
Called to room to assist during endoscopic procedure.  Patient ID and intended procedure confirmed with present staff. Received instructions for my participation in the procedure from the performing physician.  

## 2023-03-02 NOTE — Op Note (Signed)
[facility] Patient Name: Violet Boivin Procedure Date: 03/02/2023 3:48 PM MRN: [id number] Endoscopist: Docia Chuck. Henrene Pastor , MD, [id number] Age: 72 Referring MD:  Date of Birth: 03-04-1951 Gender: Male Account #: [id number] Procedure:                Colonoscopy with cold snare polypectomy x 1 Indications:              High risk colon cancer surveillance: Personal                            history of non-advanced adenomas, High risk colon                            cancer surveillance: Personal history of sessile                            serrated colon polyp (less than 10 mm in size) with                            no dysplasia. Previous examinations 2002, 2008, 2018 Medicines:                Monitored Anesthesia Care Procedure:                Pre-Anesthesia Assessment:                           - Prior to the procedure, a History and Physical                            was performed, and patient medications and                            allergies were reviewed. The patient's tolerance of                            previous anesthesia was also reviewed. The risks                            and benefits of the procedure and the sedation                            options and risks were discussed with the patient.                            All questions were answered, and informed consent                            was obtained. Prior Anticoagulants: The patient has                            taken no anticoagulant or antiplatelet agents. ASA                            Grade Assessment: II - A patient with mild systemic  disease. After reviewing the risks and benefits,                            the patient was deemed in satisfactory condition to                            undergo the procedure.                           After obtaining informed consent, the colonoscope                            was passed under direct vision. Throughout the                             procedure, the patient's blood pressure, pulse, and                            oxygen saturations were monitored continuously. The                            CF [id number] was introduced through the anus                            and advanced to the the cecum, identified by                            appendiceal orifice and ileocecal valve. The                            ileocecal valve, appendiceal orifice, and rectum                            were photographed. The quality of the bowel                            preparation was excellent. The colonoscopy was                            performed without difficulty. The patient tolerated                            the procedure well. The bowel preparation used was                            SUPREP via split dose instruction. Scope In: 4:05:39 PM Scope Out: 4:22:33 PM Scope Withdrawal Time: 0 hours 14 minutes 9 seconds  Total Procedure Duration: 0 hours 16 minutes 54 seconds  Findings:                 A 3 mm polyp was found in the ascending colon. The                            polyp was sessile. The polyp was removed with  a                            cold snare. Resection and retrieval were complete.                           A few diverticula were found in the sigmoid colon.                           Internal hemorrhoids were found during                            retroflexion. The hemorrhoids were moderate.                           The exam was otherwise without abnormality on                            direct and retroflexion views. Complications:            No immediate complications. Estimated blood loss:                            None. Estimated Blood Loss:     Estimated blood loss: none. Impression:               - One 3 mm polyp in the ascending colon, removed                            with a cold snare. Resected and retrieved.                           - Diverticulosis in the sigmoid colon.                            - Internal hemorrhoids.                           - The examination was otherwise normal on direct                            and retroflexion views. Recommendation:           - Repeat colonoscopy in 5 years for surveillance if                            polyp sessile serrated, otherwise no routine                            surveillance recommended.                           - Patient has a contact number available for                            emergencies. The signs and symptoms of potential  delayed complications were discussed with the                            patient. Return to normal activities tomorrow.                            Written discharge instructions were provided to the                            patient.                           - Resume previous diet.                           - Continue present medications.                           - Await pathology results. Docia Chuck. Henrene Pastor, MD 03/02/2023 4:30:56 PM This report has been signed electronically.

## 2023-03-02 NOTE — Progress Notes (Signed)
Vss nad trans to pacu 

## 2023-03-03 ENCOUNTER — Telehealth: Payer: Self-pay

## 2023-03-03 NOTE — Telephone Encounter (Signed)
Post procedure follow up call, no answer 

## 2023-03-07 ENCOUNTER — Encounter: Payer: Self-pay | Admitting: Internal Medicine

## 2023-03-11 DIAGNOSIS — M5416 Radiculopathy, lumbar region: Secondary | ICD-10-CM | POA: Diagnosis not present

## 2023-03-22 ENCOUNTER — Ambulatory Visit: Payer: PPO | Admitting: Nurse Practitioner

## 2023-03-22 ENCOUNTER — Ambulatory Visit: Payer: PPO | Admitting: Adult Health
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
## 2023-04-13 ENCOUNTER — Ambulatory Visit (INDEPENDENT_AMBULATORY_CARE_PROVIDER_SITE_OTHER): Payer: PPO | Admitting: Nurse Practitioner

## 2023-04-13 ENCOUNTER — Encounter: Payer: Self-pay | Admitting: Nurse Practitioner

## 2023-04-13 VITALS — BP 112/68 | HR 62 | Temp 97.5°F | Ht 68.5 in | Wt 182.0 lb

## 2023-04-13 DIAGNOSIS — I700 Atherosclerosis of aorta: Secondary | ICD-10-CM

## 2023-04-13 DIAGNOSIS — K219 Gastro-esophageal reflux disease without esophagitis: Secondary | ICD-10-CM

## 2023-04-13 DIAGNOSIS — F418 Other specified anxiety disorders: Secondary | ICD-10-CM

## 2023-04-13 DIAGNOSIS — E663 Overweight: Secondary | ICD-10-CM | POA: Diagnosis not present

## 2023-04-13 DIAGNOSIS — Z79899 Other long term (current) drug therapy: Secondary | ICD-10-CM | POA: Diagnosis not present

## 2023-04-13 DIAGNOSIS — J45909 Unspecified asthma, uncomplicated: Secondary | ICD-10-CM | POA: Diagnosis not present

## 2023-04-13 DIAGNOSIS — E782 Mixed hyperlipidemia: Secondary | ICD-10-CM | POA: Diagnosis not present

## 2023-04-13 DIAGNOSIS — I10 Essential (primary) hypertension: Secondary | ICD-10-CM | POA: Diagnosis not present

## 2023-04-13 DIAGNOSIS — R7309 Other abnormal glucose: Secondary | ICD-10-CM

## 2023-04-13 DIAGNOSIS — K5730 Diverticulosis of large intestine without perforation or abscess without bleeding: Secondary | ICD-10-CM

## 2023-04-13 DIAGNOSIS — N401 Enlarged prostate with lower urinary tract symptoms: Secondary | ICD-10-CM | POA: Diagnosis not present

## 2023-04-13 DIAGNOSIS — Z860101 Personal history of adenomatous and serrated colon polyps: Secondary | ICD-10-CM

## 2023-04-13 DIAGNOSIS — E559 Vitamin D deficiency, unspecified: Secondary | ICD-10-CM | POA: Diagnosis not present

## 2023-04-13 DIAGNOSIS — Z85820 Personal history of malignant melanoma of skin: Secondary | ICD-10-CM | POA: Diagnosis not present

## 2023-04-13 DIAGNOSIS — Z0001 Encounter for general adult medical examination with abnormal findings: Secondary | ICD-10-CM | POA: Diagnosis not present

## 2023-04-13 DIAGNOSIS — R6889 Other general symptoms and signs: Secondary | ICD-10-CM | POA: Diagnosis not present

## 2023-04-13 DIAGNOSIS — T7840XD Allergy, unspecified, subsequent encounter: Secondary | ICD-10-CM

## 2023-04-13 DIAGNOSIS — Z0000 Encounter for general adult medical examination without abnormal findings: Secondary | ICD-10-CM

## 2023-04-13 DIAGNOSIS — H9190 Unspecified hearing loss, unspecified ear: Secondary | ICD-10-CM

## 2023-04-13 DIAGNOSIS — Z86010 Personal history of colonic polyps: Secondary | ICD-10-CM

## 2023-04-13 DIAGNOSIS — N138 Other obstructive and reflux uropathy: Secondary | ICD-10-CM

## 2023-04-13 LAB — CBC WITH DIFFERENTIAL/PLATELET
Eosinophils Relative: 2.8 %
Hemoglobin: 15.9 g/dL (ref 13.2–17.1)
Lymphs Abs: 2182 cells/uL (ref 850–3900)
MCHC: 34.6 g/dL (ref 32.0–36.0)
MPV: 10.1 fL (ref 7.5–12.5)
Monocytes Relative: 11.7 %
Neutro Abs: 3924 cells/uL (ref 1500–7800)
RBC: 5.02 10*6/uL (ref 4.20–5.80)
WBC: 7.2 10*3/uL (ref 3.8–10.8)

## 2023-04-13 MED ORDER — AZELASTINE HCL 0.05 % OP SOLN: OPHTHALMIC | 99 refills | Status: DC

## 2023-04-13 MED ORDER — PREDNISOLONE ACETATE 1 % OP SUSP
OPHTHALMIC | 1 refills | Status: AC
Start: 2023-04-13 — End: ?

## 2023-04-13 MED ORDER — ESOMEPRAZOLE MAGNESIUM 40 MG PO CPDR
40.0000 mg | DELAYED_RELEASE_CAPSULE | Freq: Two times a day (BID) | ORAL | 0 refills | Status: DC
Start: 2023-04-13 — End: 2023-06-19

## 2023-04-13 NOTE — Patient Instructions (Signed)

## 2023-04-14 LAB — COMPLETE METABOLIC PANEL WITH GFR
AG Ratio: 1.7 (calc) (ref 1.0–2.5)
ALT: 39 U/L (ref 9–46)
AST: 27 U/L (ref 10–35)
Albumin: 4.3 g/dL (ref 3.6–5.1)
Alkaline phosphatase (APISO): 81 U/L (ref 35–144)
BUN: 19 mg/dL (ref 7–25)
CO2: 27 mmol/L (ref 20–32)
Calcium: 10.1 mg/dL (ref 8.6–10.3)
Chloride: 103 mmol/L (ref 98–110)
Creat: 0.94 mg/dL (ref 0.70–1.28)
Globulin: 2.6 g/dL (calc) (ref 1.9–3.7)
Glucose, Bld: 84 mg/dL (ref 65–99)
Potassium: 4.3 mmol/L (ref 3.5–5.3)
Sodium: 139 mmol/L (ref 135–146)
Total Bilirubin: 0.6 mg/dL (ref 0.2–1.2)
Total Protein: 6.9 g/dL (ref 6.1–8.1)
eGFR: 87 mL/min/{1.73_m2} (ref >=60)

## 2023-04-14 LAB — CBC WITH DIFFERENTIAL/PLATELET
Absolute Monocytes: 842 cells/uL (ref 200–950)
Basophils Absolute: 50 cells/uL (ref 0–200)
Basophils Relative: 0.7 %
Eosinophils Absolute: 202 cells/uL (ref 15–500)
HCT: 45.9 % (ref 38.5–50.0)
MCH: 31.7 pg (ref 27.0–33.0)
MCV: 91.4 fL (ref 80.0–100.0)
Neutrophils Relative %: 54.5 %
Platelets: 298 10*3/uL (ref 140–400)
RDW: 12.6 % (ref 11.0–15.0)
Total Lymphocyte: 30.3 %

## 2023-04-14 LAB — LIPID PANEL
Cholesterol: 150 mg/dL (ref <200)
HDL: 60 mg/dL (ref >=40)
LDL Cholesterol (Calc): 70 mg/dL (calc)
Non-HDL Cholesterol (Calc): 90 mg/dL (calc) (ref <130)
Total CHOL/HDL Ratio: 2.5 (calc) (ref <5.0)
Triglycerides: 115 mg/dL (ref <150)

## 2023-04-14 LAB — MAGNESIUM: Magnesium: 2.2 mg/dL (ref 1.5–2.5)

## 2023-05-14 DIAGNOSIS — H1032 Unspecified acute conjunctivitis, left eye: Secondary | ICD-10-CM | POA: Diagnosis not present

## 2023-05-18 DIAGNOSIS — T148XXA Other injury of unspecified body region, initial encounter: Secondary | ICD-10-CM | POA: Diagnosis not present

## 2023-05-18 DIAGNOSIS — W57XXXA Bitten or stung by nonvenomous insect and other nonvenomous arthropods, initial encounter: Secondary | ICD-10-CM | POA: Diagnosis not present

## 2023-06-19 ENCOUNTER — Other Ambulatory Visit: Payer: Self-pay | Admitting: Internal Medicine

## 2023-06-19 DIAGNOSIS — K219 Gastro-esophageal reflux disease without esophagitis: Secondary | ICD-10-CM

## 2023-07-13 NOTE — Progress Notes (Unsigned)
Future Appointments  Date Time Provider Department  07/14/2023                        6  mo ov 10:30 AM Lucky Cowboy, MD [facility]  10/17/2023                      9  mo ov 10:30 AM Raynelle Dick, NP [facility]  01/17/2024                         cpe 10:00 AM Lucky Cowboy, MD [facility]    History of Present Illness:      This very nice 72 y.o.  MWM presents for 6 month follow up with HTN, HLD, Pre-Diabetes and Vitamin D Deficiency. Patient has GERD controlled on current diet &  meds. Abd CT scan in 2016 showed  Aortic Atherosclerosis.        Patient is treated for HTN  since 1995 & BP has been controlled and today's BP is  at goal - 102/68 .  Patient denies any cardiac type chest pain, palpitations, dyspnea Pollyann Kennedy /PND, dizziness, claudication or dependent edema.        Hyperlipidemia is controlled with diet & Atorvastatin.   Patient denies myalgias or other med SE's. Last Lipids were at goal:  Lab Results  Component Value Date   CHOL 150 04/13/2023   HDL 60 04/13/2023   LDLCALC 70 04/13/2023   TRIG 115 04/13/2023   CHOLHDL 2.5 04/13/2023     Also, the patient has history of PreDiabetes (A1c 6.0% /2012) and has had no symptoms of reactive hypoglycemia, diabetic polys, paresthesias or visual blurring.  Last A1c was near  goal:  Lab Results  Component Value Date   HGBA1C 6.0 (H) 01/11/2023                                                       Further, the patient also has history of Vitamin D Deficiency ("34" /2012) and supplements vitamin D without any suspected side-effects. Last vitamin D was at goal:  Lab Results  Component Value Date   VD25OH 77 01/11/2023       Current Outpatient Medications  Medication Instructions   VITAMIN C Oral   atorvastatin 10 MG tablet Take  1 tablet  every other day  for Cholesterol   azelastine ophth soln PLACE 1 DROP INTO BOTH EYES 2 TIMES DAILY AS NEEDED.   SUPER B COMPLEX  Daily   VITAMIN D  5,000 units Daily    Esomeprazole  40 MG capsule TAKE 1 CAPSULE EVERY MORNING   famotidine 40 MG tablet TAKE 1 TABLET EVERY NIGHT    Fexofenadine  Oral   olmesartan 40 MG tablet Take 1 tablet  Daily  for BP   PRED FORTE  1 % ophth  susp Place 1-2 drops in both eyes twice daily as needed.   Probiotic  Daily   zinc  50 mg Daily     Allergies  Allergen Reactions   Hismanal [Astemizole]    Latex    Penicillins Hives    PMHx:   Past Medical History:  Diagnosis Date   Allergy    Cancer (HCC)    skin cancer melanoma and  basal cell lt. cheek   Cataract    both eyes no surgery needed   Chronic kidney disease    kidney stones   GERD (gastroesophageal reflux disease)    Hyperlipidemia    Hypertension    white coat syndrome   Prediabetes    Vitamin D deficiency      Immunization History  Administered Date(s) Administered   Influenza, High Dose Seasonal PF 10/12/2017, 10/13/2018, 10/17/2019, 10/14/2020   Influenza-Unspecified 10/23/2015   Moderna Sars-Covid-2 Vaccination 01/24/2020, 02/21/2020, 10/16/2020   Pneumococcal Conjugate-13 07/25/2017   Pneumococcal Polysaccharide-23 06/10/2011, 07/26/2018   Td 04/14/2006   Tdap 07/20/2016   Zoster, Live 01/16/2013     Past Surgical History:  Procedure Laterality Date   BASAL CELL CARCINOMA EXCISION     COLONOSCOPY     ELBOW ARTHROSCOPY WITH TENDON RECONSTRUCTION Left 2013   GUM SURGERY Left 2017   bone graft, root canal left side   KNEE ARTHROSCOPY Right 1982, 1999   melanoma excision     cheek lt.   SPLENECTOMY       FHx:    Reviewed / unchanged   SHx:    Reviewed / unchanged    Systems Review:  Constitutional: Denies fever, chills, wt changes, headaches, insomnia, fatigue, night sweats, change in appetite. Eyes: Denies redness, blurred vision, diplopia, discharge, itchy, watery eyes.  ENT: Denies discharge, congestion, post nasal drip, epistaxis, sore throat, earache, hearing loss, dental pain, tinnitus, vertigo, sinus pain,  snoring.  CV: Denies chest pain, palpitations, irregular heartbeat, syncope, dyspnea, diaphoresis, orthopnea, PND, claudication or edema. Respiratory: denies cough, dyspnea, DOE, pleurisy, hoarseness, laryngitis, wheezing.  Gastrointestinal: Denies dysphagia, odynophagia, heartburn, reflux, water brash, abdominal pain or cramps, nausea, vomiting, bloating, diarrhea, constipation, hematemesis, melena, hematochezia  or hemorrhoids. Genitourinary: Denies dysuria, frequency, urgency, nocturia, hesitancy, discharge, hematuria or flank pain. Musculoskeletal: Denies arthralgias, myalgias, stiffness, jt. swelling, pain, limping or strain/sprain.  Skin: Denies pruritus, rash, hives, warts, acne, eczema or change in skin lesion(s). Neuro: No weakness, tremor, incoordination, spasms, paresthesia or pain. Psychiatric: Denies confusion, memory loss or sensory loss. Endo: Denies change in weight, skin or hair change.  Heme/Lymph: No excessive bleeding, bruising or enlarged lymph nodes.  Physical Exam  BP 102/68   Pulse (!) 55   Temp (!) 97 F (36.1 C)   Resp 16   Ht 5' 8.5" (1.74 m)   Wt 183 lb 12.8 oz (83.4 kg)   SpO2 97%   BMI 27.54 kg/m   Appears  well nourished, well groomed  and in no distress.  Eyes: PERRLA, EOMs, conjunctiva no swelling or erythema. Sinuses: No frontal/maxillary tenderness ENT/Mouth: EAC's clear, TM's nl w/o erythema, bulging. Nares clear w/o erythema, swelling, exudates. Oropharynx clear without erythema or exudates. Oral hygiene is good. Tongue normal, non obstructing. Hearing intact.  Neck: Supple. Thyroid not palpable. Car 2+/2+ without bruits, nodes or JVD. Chest: Respirations nl with BS clear & equal w/o rales, rhonchi, wheezing or stridor.  Cor: Heart sounds normal w/ regular rate and rhythm without sig. murmurs, gallops, clicks or rubs. Peripheral pulses normal and equal  without edema.  Abdomen: Soft & bowel sounds normal. Non-tender w/o guarding, rebound, hernias,  masses or organomegaly.  Lymphatics: Unremarkable.  Musculoskeletal: Full ROM all peripheral extremities, joint stability, 5/5 strength and normal gait.  Skin: Warm, dry without exposed rashes, lesions or ecchymosis apparent.  Neuro: Cranial nerves intact, reflexes equal bilaterally. Sensory-motor testing grossly intact. Tendon reflexes grossly intact.  Pysch: Alert & oriented x 3.  Insight  and judgement nl & appropriate. No ideations.  Assessment and Plan:  1. Essential hypertension  - Continue medication, monitor blood pressure at home.  - Continue DASH diet.  Reminder to go to the ER if any CP,  SOB, nausea, dizziness, severe HA, changes vision/speech.    - CBC with Differential/Platelet - COMPLETE METABOLIC PANEL WITH GFR - Magnesium - TSH  2. Hyperlipidemia, mixed  - Continue diet/meds, exercise,& lifestyle modifications.  - Continue monitor periodic cholesterol/liver & renal functions    - Lipid panel - TSH  3. Abnormal glucose  - Continue diet, exercise  - Lifestyle modifications.  - Monitor appropriate labs  - Continue supplementation  - Hemoglobin A1c - Insulin, random  4. Vitamin D deficiency  - VITAMIN D 25 Hydroxy   5. GERD  - Change med to Nexium/Pepcid  6. Aortic atherosclerosis (HCC) by Abd CTscan on 06/15/2015  - Lipid panel  7. Medication management  - CBC with Differential/Platelet - COMPLETE METABOLIC PANEL WITH GFR - Magnesium - Lipid panel - TSH - Hemoglobin A1c - Insulin, random - VITAMIN D 25 Hydroxy         Discussed  regular exercise, BP monitoring, weight control to achieve/maintain BMI less than 25 and discussed med and SE's. Recommended labs to assess and monitor clinical status with further disposition pending results of labs.  I discussed the assessment and treatment plan with the patient. The patient was provided an opportunity to ask questions and all were answered. The patient agreed with the plan and demonstrated an  understanding of the instructions.  I provided over 30 minutes of exam, counseling, chart review and  complex critical decision making.        The patient was advised to call back or seek an in-person evaluation if the symptoms worsen or if the condition fails to improve as anticipated.   Marinus Maw, MD

## 2023-07-14 ENCOUNTER — Ambulatory Visit (INDEPENDENT_AMBULATORY_CARE_PROVIDER_SITE_OTHER): Payer: PPO | Admitting: Internal Medicine

## 2023-07-14 ENCOUNTER — Encounter: Payer: Self-pay | Admitting: Internal Medicine

## 2023-07-14 VITALS — BP 102/68 | HR 55 | Temp 97.0°F | Resp 16 | Ht 68.5 in | Wt 183.8 lb

## 2023-07-14 DIAGNOSIS — E782 Mixed hyperlipidemia: Secondary | ICD-10-CM

## 2023-07-14 DIAGNOSIS — I10 Essential (primary) hypertension: Secondary | ICD-10-CM

## 2023-07-14 DIAGNOSIS — I700 Atherosclerosis of aorta: Secondary | ICD-10-CM

## 2023-07-14 DIAGNOSIS — R7309 Other abnormal glucose: Secondary | ICD-10-CM

## 2023-07-14 DIAGNOSIS — E559 Vitamin D deficiency, unspecified: Secondary | ICD-10-CM | POA: Diagnosis not present

## 2023-07-14 DIAGNOSIS — Z79899 Other long term (current) drug therapy: Secondary | ICD-10-CM

## 2023-07-14 LAB — CBC WITH DIFFERENTIAL/PLATELET
Absolute Monocytes: 858 cells/uL (ref 200–950)
Basophils Absolute: 73 cells/uL (ref 0–200)
Basophils Relative: 1.1 %
Eosinophils Absolute: 257 cells/uL (ref 15–500)
Eosinophils Relative: 3.9 %
HCT: 44.2 % (ref 38.5–50.0)
Hemoglobin: 15.3 g/dL (ref 13.2–17.1)
Lymphs Abs: 2020 cells/uL (ref 850–3900)
MCH: 31.5 pg (ref 27.0–33.0)
MCHC: 34.6 g/dL (ref 32.0–36.0)
MCV: 90.9 fL (ref 80.0–100.0)
MPV: 10.3 fL (ref 7.5–12.5)
Neutro Abs: 3392 cells/uL (ref 1500–7800)
Neutrophils Relative %: 51.4 %
Platelets: 305 10*3/uL (ref 140–400)
RBC: 4.86 10*6/uL (ref 4.20–5.80)
RDW: 12.7 % (ref 11.0–15.0)
WBC: 6.6 10*3/uL (ref 3.8–10.8)

## 2023-07-14 NOTE — Patient Instructions (Signed)

## 2023-07-15 NOTE — Progress Notes (Signed)
^<^<^<^<^<^<^<^<^<^<^<^<^<^<^<^<^<^<^<^<^<^<^<^<^<^<^<^<^<^<^<^<^<^<^<^<^ ^>^>^>^>^>^>^>^>^>^>^>>^>^>^>^>^>^>^>^>^>^>^>^>^>^>^>^>^>^>^>^>^>^>^>^>^>  -  Test results slightly outside the reference range are not unusual. If there is anything important, I will review this with you,  otherwise it is considered normal test values.  If you have further questions,  please do not hesitate to contact me at the office or via My Chart.   ^<^<^<^<^<^<^<^<^<^<^<^<^<^<^<^<^<^<^<^<^<^<^<^<^<^<^<^<^<^<^<^<^<^<^<^<^ ^>^>^>^>^>^>^>^>^>^>^>^>^>^>^>^>^>^>^>^>^>^>^>^>^>^>^>^>^>^>^>^>^>^>^>^>^  -   A1c = 6.0% - 12 week average glucose is slightly elevated,  So   - Avoid Sweets, Candy & White Stuff   - White Rice, White Potatoes, White Flour  - Breads &  Pasta  ^<^<^<^<^<^<^<^<^<^<^<^<^<^<^<^<^<^<^<^<^<^<^<^<^<^<^<^<^<^<^<^<^<^<^<^<^ ^>^>^>^>^>^>^>^>^>^>^>^>^>^>^>^>^>^>^>^>^>^>^>^>^>^>^>^>^>^>^>^>^>^>^>^>^  -   Chol = 148   &   LDL = 78  - Both  Excellent   !  - Very low risk for Heart Attack  / Stroke  !  ^>^>^>^>^>^>^>^>^>^>^>^>^>^>^>^>^>^>^>^>^>^>^>^>^>^>^>^>^>^>^>^>^>^>^>^>^ ^>^>^>^>^>^>^>^>^>^>^>^>^>^>^>^>^>^>^>^>^>^>^>^>^>^>^>^>^>^>^>^>^>^>^>^>^  -  Vitamin D = 89   - Excellent  !             Please keep dose same   ^<^<^<^<^<^<^<^<^<^<^<^<^<^<^<^<^<^<^<^<^<^<^<^<^<^<^<^<^<^<^<^<^<^<^<^<^ ^>^>^>^>^>^>^>^>^>^>^>^>^>^>^>^>^>^>^>^>^>^>^>^>^>^>^>^>^>^>^>^>^>^>^>^>^  -   All Else - CBC - Kidneys - Electrolytes - Liver - Magnesium & Thyroid    - all  Normal / OK ^<^<^<^<^<^<^<^<^<^<^<^<^<^<^<^<^<^<^<^<^<^<^<^<^<^<^<^<^<^<^<^<^<^<^<^<^ ^>^>^>^>^>^>^>^>^>^>^>^>^>^>^>^>^>^>^>^>^>^>^>^>^>^>^>^>^>^>^>^>^>^>^>^>^  -   Keep up the [place] Work   !   ^<^<^<^<^<^<^<^<^<^<^<^<^<^<^<^<^<^<^<^<^<^<^<^<^<^<^<^<^<^<^<^<^<^<^<^<^ ^>^>^>^>^>^>^>^>^>^>^>^>^>^>^>^>^>^>^>^>^>^>^>^>^>^>^>^>^>^>^>^>^>^>^>^>^

## 2023-07-26 ENCOUNTER — Encounter: Payer: Self-pay | Admitting: Internal Medicine

## 2023-08-15 ENCOUNTER — Other Ambulatory Visit: Payer: Self-pay | Admitting: Internal Medicine

## 2023-08-15 ENCOUNTER — Encounter: Payer: Self-pay | Admitting: Internal Medicine

## 2023-08-15 MED ORDER — DEXAMETHASONE 4 MG PO TABS: ORAL_TABLET | ORAL | 0 refills | Status: DC

## 2023-08-15 MED ORDER — BENZONATATE 200 MG PO CAPS: ORAL_CAPSULE | ORAL | 1 refills | Status: DC

## 2023-08-15 MED ORDER — PSEUDOEPHEDRINE HCL ER 120 MG PO TB12: ORAL_TABLET | ORAL | 2 refills | Status: DC
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
## 2023-10-17 ENCOUNTER — Ambulatory Visit: Payer: PPO | Admitting: Nurse Practitioner

## 2023-10-17 DIAGNOSIS — F418 Other specified anxiety disorders: Secondary | ICD-10-CM

## 2023-10-17 DIAGNOSIS — E559 Vitamin D deficiency, unspecified: Secondary | ICD-10-CM

## 2023-10-17 DIAGNOSIS — Z79899 Other long term (current) drug therapy: Secondary | ICD-10-CM

## 2023-10-17 DIAGNOSIS — E663 Overweight: Secondary | ICD-10-CM

## 2023-10-17 DIAGNOSIS — I10 Essential (primary) hypertension: Secondary | ICD-10-CM

## 2023-10-17 DIAGNOSIS — E782 Mixed hyperlipidemia: Secondary | ICD-10-CM

## 2023-10-17 DIAGNOSIS — R7309 Other abnormal glucose: Secondary | ICD-10-CM

## 2023-10-17 DIAGNOSIS — I700 Atherosclerosis of aorta: Secondary | ICD-10-CM

## 2023-10-27 ENCOUNTER — Ambulatory Visit: Payer: PPO | Admitting: Nurse Practitioner
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
## 2023-11-01 ENCOUNTER — Encounter: Payer: Self-pay | Admitting: Nurse Practitioner

## 2023-11-01 ENCOUNTER — Ambulatory Visit (INDEPENDENT_AMBULATORY_CARE_PROVIDER_SITE_OTHER): Payer: PPO | Admitting: Nurse Practitioner

## 2023-11-01 VITALS — BP 122/68 | HR 80 | Temp 97.5°F | Ht 68.5 in | Wt 186.4 lb

## 2023-11-01 DIAGNOSIS — E663 Overweight: Secondary | ICD-10-CM | POA: Diagnosis not present

## 2023-11-01 DIAGNOSIS — E782 Mixed hyperlipidemia: Secondary | ICD-10-CM

## 2023-11-01 DIAGNOSIS — F418 Other specified anxiety disorders: Secondary | ICD-10-CM

## 2023-11-01 DIAGNOSIS — R7309 Other abnormal glucose: Secondary | ICD-10-CM

## 2023-11-01 DIAGNOSIS — Z79899 Other long term (current) drug therapy: Secondary | ICD-10-CM | POA: Diagnosis not present

## 2023-11-01 DIAGNOSIS — K1370 Unspecified lesions of oral mucosa: Secondary | ICD-10-CM

## 2023-11-01 DIAGNOSIS — I700 Atherosclerosis of aorta: Secondary | ICD-10-CM

## 2023-11-01 DIAGNOSIS — E559 Vitamin D deficiency, unspecified: Secondary | ICD-10-CM

## 2023-11-01 DIAGNOSIS — I10 Essential (primary) hypertension: Secondary | ICD-10-CM

## 2023-11-01 NOTE — Patient Instructions (Signed)

## 2023-11-02 LAB — COMPLETE METABOLIC PANEL WITH GFR
AG Ratio: 1.7 (calc) (ref 1.0–2.5)
ALT: 24 U/L (ref 9–46)
AST: 23 U/L (ref 10–35)
Albumin: 4.3 g/dL (ref 3.6–5.1)
Alkaline phosphatase (APISO): 81 U/L (ref 35–144)
BUN: 17 mg/dL (ref 7–25)
CO2: 24 mmol/L (ref 20–32)
Calcium: 10 mg/dL (ref 8.6–10.3)
Chloride: 105 mmol/L (ref 98–110)
Creat: 0.95 mg/dL (ref 0.70–1.28)
Globulin: 2.5 g/dL (ref 1.9–3.7)
Glucose, Bld: 99 mg/dL (ref 65–99)
Potassium: 4.1 mmol/L (ref 3.5–5.3)
Sodium: 140 mmol/L (ref 135–146)
Total Bilirubin: 0.4 mg/dL (ref 0.2–1.2)
Total Protein: 6.8 g/dL (ref 6.1–8.1)
eGFR: 86 mL/min/{1.73_m2} (ref >=60)

## 2023-11-02 LAB — CBC WITH DIFFERENTIAL/PLATELET
Absolute Lymphocytes: 2410 {cells}/uL (ref 850–3900)
Absolute Monocytes: 1016 {cells}/uL — ABNORMAL HIGH (ref 200–950)
Basophils Absolute: 46 {cells}/uL (ref 0–200)
Basophils Relative: 0.6 %
Eosinophils Absolute: 347 {cells}/uL (ref 15–500)
Eosinophils Relative: 4.5 %
HCT: 45.8 % (ref 38.5–50.0)
Hemoglobin: 15.6 g/dL (ref 13.2–17.1)
MCH: 30.8 pg (ref 27.0–33.0)
MCHC: 34.1 g/dL (ref 32.0–36.0)
MCV: 90.3 fL (ref 80.0–100.0)
MPV: 9.9 fL (ref 7.5–12.5)
Monocytes Relative: 13.2 %
Neutro Abs: 3881 {cells}/uL (ref 1500–7800)
Neutrophils Relative %: 50.4 %
Platelets: 337 10*3/uL (ref 140–400)
RBC: 5.07 10*6/uL (ref 4.20–5.80)
RDW: 13.1 % (ref 11.0–15.0)
Total Lymphocyte: 31.3 %
WBC: 7.7 10*3/uL (ref 3.8–10.8)

## 2023-11-02 LAB — LIPID PANEL
Cholesterol: 163 mg/dL (ref <200)
HDL: 56 mg/dL (ref >=40)
LDL Cholesterol (Calc): 82 mg/dL
Non-HDL Cholesterol (Calc): 107 mg/dL (ref <130)
Total CHOL/HDL Ratio: 2.9 (calc) (ref <5.0)
Triglycerides: 156 mg/dL — ABNORMAL HIGH (ref <150)

## 2023-11-02 LAB — MAGNESIUM: Magnesium: 2.2 mg/dL (ref 1.5–2.5)

## 2023-11-04 DIAGNOSIS — D1039 Benign neoplasm of other parts of mouth: Secondary | ICD-10-CM | POA: Diagnosis not present

## 2024-01-17 ENCOUNTER — Encounter: Payer: PPO | Admitting: Internal Medicine

## 2024-02-15 ENCOUNTER — Encounter: Payer: PPO | Admitting: Internal Medicine

## 2024-02-17 ENCOUNTER — Other Ambulatory Visit: Payer: Self-pay

## 2024-02-17 DIAGNOSIS — I10 Essential (primary) hypertension: Secondary | ICD-10-CM

## 2024-02-17 MED ORDER — OLMESARTAN MEDOXOMIL 40 MG PO TABS: ORAL_TABLET | ORAL | 0 refills | Status: DC

## 2024-03-13 ENCOUNTER — Encounter (HOSPITAL_BASED_OUTPATIENT_CLINIC_OR_DEPARTMENT_OTHER): Payer: Self-pay | Admitting: Family Medicine

## 2024-03-13 ENCOUNTER — Ambulatory Visit (INDEPENDENT_AMBULATORY_CARE_PROVIDER_SITE_OTHER): Admitting: Family Medicine

## 2024-03-13 ENCOUNTER — Ambulatory Visit (HOSPITAL_BASED_OUTPATIENT_CLINIC_OR_DEPARTMENT_OTHER): Payer: PPO | Admitting: Family Medicine

## 2024-03-13 VITALS — BP 152/76 | HR 64 | Ht 67.0 in | Wt 188.2 lb

## 2024-03-13 DIAGNOSIS — I10 Essential (primary) hypertension: Secondary | ICD-10-CM | POA: Diagnosis not present

## 2024-03-13 DIAGNOSIS — Z125 Encounter for screening for malignant neoplasm of prostate: Secondary | ICD-10-CM | POA: Diagnosis not present

## 2024-03-13 DIAGNOSIS — R7303 Prediabetes: Secondary | ICD-10-CM

## 2024-03-13 DIAGNOSIS — E559 Vitamin D deficiency, unspecified: Secondary | ICD-10-CM

## 2024-03-13 DIAGNOSIS — E785 Hyperlipidemia, unspecified: Secondary | ICD-10-CM

## 2024-03-13 NOTE — Assessment & Plan Note (Signed)
 Blood pressure borderline in office today.  Reports that home readings have been at goal.  Reports history of having higher blood pressure readings in the office.  Recommend continue with current medication regimen.  Recommend intermittent monitoring of blood pressure at home, DASH diet. Can bring blood pressure cuff into office and we can compare to our reading here in the office to ensure accuracy

## 2024-03-13 NOTE — Assessment & Plan Note (Signed)
 Prior hemoglobin A1c has been stable within prediabetes range.  No current symptoms to suggest overt diabetes.  We will plan to recheck A1c prior to next office visit

## 2024-03-13 NOTE — Assessment & Plan Note (Signed)
 Continues with vitamin D supplementation.  Prior vitamin D level has generally been well-controlled.  We will plan to recheck vitamin D level prior to next office visit

## 2024-03-13 NOTE — Patient Instructions (Signed)
  Medication Instructions:  Your physician recommends that you continue on your current medications as directed. Please refer to the Current Medication list given to you today. --If you need a refill on any your medications before your next appointment, please call your pharmacy first. If no refills are authorized on file call the office.-- Lab Work: Your physician has recommended that you have lab work today: 1 week before next visit  If you have labs (blood work) drawn today and your tests are completely normal, you will receive your results via MyChart message OR a phone call from our staff.  Please ensure you check your voicemail in the event that you authorized detailed messages to be left on a delegated number. If you have any lab test that is abnormal or we need to change your treatment, we will call you to review the results.   Follow-Up: Your next appointment:   Your physician recommends that you schedule a follow-up appointment in: 4-6 month follow up  with Dr. de [place]  You will receive a text message or e-mail with a link to a survey about your care and experience with [place] today! We would greatly appreciate your feedback!   Thanks for letting us be apart of your health journey!!  Primary Care and Sports Medicine   Dr. Ceasar Mons [place]   We encourage you to activate your patient portal called "MyChart".  Sign up information is provided on this After Visit Summary.  MyChart is used to connect with patients for Virtual Visits (Telemedicine).  Patients are able to view lab/test results, encounter notes, upcoming appointments, etc.  Non-urgent messages can be sent to your provider as well. To learn more about what you can do with MyChart, please visit --  [email or web address].

## 2024-03-13 NOTE — Assessment & Plan Note (Signed)
 Tolerating statin therapy.  Discussed considerations related to dosing.  Prior lipid panels have been generally well-controlled.  Can continue with current regimen.  Could consider switching to daily administration since patient is not aware of any issues with medication in the past

## 2024-03-13 NOTE — Progress Notes (Signed)
 New Patient Office Visit  Subjective   Patient ID: Barry Flynn, male    DOB: 07-Apr-1951  Age: 73 y.o. MRN: [id number]  CC:  Chief Complaint  Patient presents with   New Patient (Initial Visit)    New patient pt of dr Vaughan Basta just establishing care     HPI Barry Flynn presents to establish care Last PCP - Dr. Oneta Rack  Has questions about recent covid illness. Was sick around [place].  HLD: takes atorvastatin every other day. Wonders about continuing with this. No specific issues with medication reported.  Does follow with Delbert Harness related to various [facility] issues.  Takes vitamin D supplement daily for vitamin D deficiency.  GERD: takes Nexium in the morning and Pepcid at night. No current issues reoprted.  HTN: takes olmesartan daily, no issues with medication. Does check BP at home, reports that home readings have been controlled.  Patient is originally from [facility]. 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 He enjoys fishing, gardening, new puppy, has [place]+ acres that he lives on. Walks about 2 miles per day.  Outpatient Encounter Medications as of 03/13/2024  Medication Sig   Ascorbic Acid (VITAMIN C PO) Take by mouth.   atorvastatin (LIPITOR) 10 MG tablet Take  1 tablet  every other day  for Cholesterol   azelastine (OPTIVAR) 0.05 % ophthalmic solution PLACE 1 DROP INTO BOTH EYES 2 TIMES DAILY AS NEEDED.   B Complex-C (SUPER B COMPLEX PO) Take by mouth daily.   Cholecalciferol (VITAMIN D PO) Take 5,000 Int'l Units by mouth daily.   esomeprazole (NEXIUM) 40 MG capsule TAKE 1 CAPSULE EVERY MORNING TO PREVENT HEARTBURN & INDIGESTION   famotidine (PEPCID) 40 MG tablet TAKE 1 TABLET EVERY NIGHT TO PREVENT HEARTBURN & ACID INDIGESTION   Fexofenadine HCl (ALLEGRA PO) Take by mouth.   olmesartan (BENICAR) 40 MG tablet Take 1 tablet  Daily  for BP   prednisoLONE acetate (PRED FORTE) 1 % ophthalmic suspension Place 1-2 drops in both eyes twice daily as needed.    Probiotic Product (PROBIOTIC BLEND PO) Take by mouth daily.   zinc gluconate 50 MG tablet Take 50 mg by mouth daily.   No facility-administered encounter medications on file as of 03/13/2024.    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   SPLENECTOMY      Family History  Problem Relation Age of Onset   Alzheimer's disease Mother    Heart disease Father    Dementia Father    Colon polyps Brother    Colon cancer Neg Hx    Esophageal cancer Neg Hx    Rectal cancer Neg Hx    Stomach cancer Neg Hx     Social History   Socioeconomic History   Marital status: Married  Spouse name: Not on file   Number of children: Not on file   Years of education: Not on file   Highest education level: Associate degree: occupational, Scientist, product/process development, or vocational program  Occupational History   Not on file  Tobacco Use   Smoking status: Never    Passive exposure: Never   Smokeless tobacco: Never  Vaping Use   Vaping status: Never Used  Substance and Sexual Activity   Alcohol use: Yes    Alcohol/week: 5.0 standard drinks of alcohol    Types: 5 Standard drinks or equivalent per week    Comment: Wine   Drug use: No   Sexual activity: Not on file  Other Topics Concern   Not on file  Social History Narrative   Not on file   Social Drivers of Health   Financial Resource Strain: Low Risk  (03/11/2024)   Overall Financial Resource Strain (CARDIA)     Difficulty of Paying Living Expenses: Not hard at all  Food Insecurity: No Food Insecurity (03/11/2024)   Hunger Vital Sign    Worried About Running Out of Food in the Last Year: Never true    Ran Out of Food in the Last Year: Never true  Transportation Needs: No Transportation Needs (03/11/2024)   PRAPARE - Administrator, Civil Service (Medical): No    Lack of Transportation (Non-Medical): No  Physical Activity: Sufficiently Active (03/11/2024)   Exercise Vital Sign    Days of Exercise per Week: 7 days    Minutes of Exercise per Session: 40 min  Stress: No Stress Concern Present (03/11/2024)   [employer] of Occupational Health - Occupational Stress Questionnaire    Feeling of Stress : Not at all  Social Connections: Socially Integrated (03/11/2024)   Social Connection and Isolation Panel [NHANES]    Frequency of Communication with Friends and Family: Twice a week    Frequency of Social Gatherings with Friends and Family: Once a week    Attends Religious Services: More than 4 times per year    Active Member of [employer] or Organizations: Yes    Attends Engineer, structural: More than 4 times per year    Marital Status: Married  Catering manager Violence: Not on file    Objective   BP (!) 152/76 (BP Location: Right Arm, Patient Position: Sitting, Cuff Size: Normal)   Pulse 64   Ht 5\' 7"  (1.702 m)   Wt 188 lb 3.2 oz (85.4 kg)   SpO2 95%   BMI 29.48 kg/m   Physical Exam  73 year old male in no acute distress Cardiovascular exam with regular rate and rhythm, no murmur appreciated Lungs clear to auscultation bilaterally  Assessment & Plan:   Primary hypertension Assessment & Plan: Blood pressure borderline in office today.  Reports that home readings have been at goal.  Reports history of having higher blood pressure readings in the office.  Recommend continue with current medication regimen.  Recommend intermittent monitoring of blood pressure at  home, DASH diet. Can bring blood pressure cuff into office and we can compare to our reading here in the office to ensure accuracy  Orders: -     CBC with Differential/Platelet; Future -     Comprehensive metabolic panel; Future -     Lipid panel; Future -     Hemoglobin A1c; Future  Prediabetes Assessment & Plan: Prior hemoglobin A1c has been stable within prediabetes range.  No current symptoms to suggest overt diabetes.  We will plan  to recheck A1c prior to next office visit  Orders: -     Hemoglobin A1c; Future  Vitamin D deficiency Assessment & Plan: Continues with vitamin D supplementation.  Prior vitamin D level has generally been well-controlled.  We will plan to recheck vitamin D level prior to next office visit  Orders: -     VITAMIN D 25 Hydroxy (Vit-D Deficiency, Fractures); Future  Hyperlipidemia, unspecified hyperlipidemia type Assessment & Plan: Tolerating statin therapy.  Discussed considerations related to dosing.  Prior lipid panels have been generally well-controlled.  Can continue with current regimen.  Could consider switching to daily administration since patient is not aware of any issues with medication in the past  Orders: -     Lipid panel; Future  Prostate cancer screening -     PSA Total (Reflex To Free); Future  Return in about 6 months (around 09/13/2024) for hypertension, prediabetes.    ___________________________________________ Yuka Lallier de [place], MD, ABFM, CAQSM Primary Care and Sports Medicine [facility]

## 2024-03-14 ENCOUNTER — Other Ambulatory Visit (HOSPITAL_BASED_OUTPATIENT_CLINIC_OR_DEPARTMENT_OTHER): Payer: Self-pay | Admitting: *Deleted

## 2024-03-14 ENCOUNTER — Encounter (HOSPITAL_BASED_OUTPATIENT_CLINIC_OR_DEPARTMENT_OTHER): Payer: Self-pay | Admitting: Family Medicine

## 2024-03-14 DIAGNOSIS — E782 Mixed hyperlipidemia: Secondary | ICD-10-CM

## 2024-03-14 MED ORDER — ATORVASTATIN CALCIUM 10 MG PO TABS: ORAL_TABLET | ORAL | 3 refills | Status: AC

## 2024-03-19 DIAGNOSIS — D225 Melanocytic nevi of trunk: Secondary | ICD-10-CM | POA: Diagnosis not present

## 2024-03-19 DIAGNOSIS — Z85828 Personal history of other malignant neoplasm of skin: Secondary | ICD-10-CM | POA: Diagnosis not present

## 2024-03-19 DIAGNOSIS — L719 Rosacea, unspecified: Secondary | ICD-10-CM | POA: Diagnosis not present

## 2024-03-19 DIAGNOSIS — L578 Other skin changes due to chronic exposure to nonionizing radiation: Secondary | ICD-10-CM | POA: Diagnosis not present

## 2024-03-19 DIAGNOSIS — Z85820 Personal history of malignant melanoma of skin: Secondary | ICD-10-CM | POA: Diagnosis not present

## 2024-03-19 DIAGNOSIS — L821 Other seborrheic keratosis: Secondary | ICD-10-CM | POA: Diagnosis not present

## 2024-03-19 DIAGNOSIS — L814 Other melanin hyperpigmentation: Secondary | ICD-10-CM | POA: Diagnosis not present

## 2024-03-19 DIAGNOSIS — Z86018 Personal history of other benign neoplasm: Secondary | ICD-10-CM | POA: Diagnosis not present

## 2024-04-12 ENCOUNTER — Other Ambulatory Visit (HOSPITAL_BASED_OUTPATIENT_CLINIC_OR_DEPARTMENT_OTHER): Payer: Self-pay | Admitting: *Deleted

## 2024-04-12 ENCOUNTER — Encounter (HOSPITAL_BASED_OUTPATIENT_CLINIC_OR_DEPARTMENT_OTHER): Payer: Self-pay | Admitting: Family Medicine

## 2024-04-12 DIAGNOSIS — T7840XD Allergy, unspecified, subsequent encounter: Secondary | ICD-10-CM

## 2024-04-12 DIAGNOSIS — K219 Gastro-esophageal reflux disease without esophagitis: Secondary | ICD-10-CM

## 2024-04-12 MED ORDER — FAMOTIDINE 40 MG PO TABS: ORAL_TABLET | ORAL | 3 refills | Status: AC

## 2024-04-12 MED ORDER — AZELASTINE HCL 0.05 % OP SOLN: OPHTHALMIC | 99 refills | Status: AC

## 2024-04-29 ENCOUNTER — Other Ambulatory Visit: Payer: Self-pay | Admitting: Family

## 2024-04-29 DIAGNOSIS — I10 Essential (primary) hypertension: Secondary | ICD-10-CM

## 2024-05-03 ENCOUNTER — Encounter (HOSPITAL_BASED_OUTPATIENT_CLINIC_OR_DEPARTMENT_OTHER): Payer: Self-pay | Admitting: Family Medicine

## 2024-05-03 DIAGNOSIS — I10 Essential (primary) hypertension: Secondary | ICD-10-CM

## 2024-05-03 DIAGNOSIS — H2513 Age-related nuclear cataract, bilateral: Secondary | ICD-10-CM | POA: Diagnosis not present

## 2024-05-03 DIAGNOSIS — H43393 Other vitreous opacities, bilateral: Secondary | ICD-10-CM | POA: Diagnosis not present

## 2024-05-03 MED ORDER — OLMESARTAN MEDOXOMIL 40 MG PO TABS: 40.0000 mg | ORAL_TABLET | Freq: Every day | ORAL | 3 refills | Status: AC

## 2024-05-23 ENCOUNTER — Encounter (HOSPITAL_BASED_OUTPATIENT_CLINIC_OR_DEPARTMENT_OTHER): Payer: Self-pay | Admitting: Family Medicine

## 2024-05-24 ENCOUNTER — Other Ambulatory Visit (HOSPITAL_BASED_OUTPATIENT_CLINIC_OR_DEPARTMENT_OTHER): Payer: Self-pay | Admitting: *Deleted

## 2024-05-24 DIAGNOSIS — K219 Gastro-esophageal reflux disease without esophagitis: Secondary | ICD-10-CM

## 2024-05-24 MED ORDER — ESOMEPRAZOLE MAGNESIUM 40 MG PO CPDR: 40.0000 mg | DELAYED_RELEASE_CAPSULE | ORAL | 1 refills | Status: DC

## 2024-05-31 ENCOUNTER — Ambulatory Visit (HOSPITAL_BASED_OUTPATIENT_CLINIC_OR_DEPARTMENT_OTHER)

## 2024-05-31 ENCOUNTER — Encounter (HOSPITAL_BASED_OUTPATIENT_CLINIC_OR_DEPARTMENT_OTHER): Payer: Self-pay

## 2024-05-31 VITALS — Ht 67.0 in | Wt 188.0 lb

## 2024-05-31 DIAGNOSIS — Z0000 Encounter for general adult medical examination without abnormal findings: Secondary | ICD-10-CM | POA: Diagnosis not present

## 2024-05-31 NOTE — Progress Notes (Signed)
 Subjective:   Barry Flynn is a 73 y.o. who presents for a Medicare Wellness preventive visit.  As a reminder, Annual Wellness Visits don't include a physical exam, and some assessments may be limited, especially if this visit is performed virtually. We may recommend an in-person follow-up visit with your provider if needed.  Visit Complete: Virtual I connected with  Adelina Adu on 05/31/24 by a audio enabled telemedicine application and verified that I am speaking with the correct person using two identifiers.  Patient Location: Home  Provider Location: Office/Clinic  I discussed the limitations of evaluation and management by telemedicine. The patient expressed understanding and agreed to proceed.  Vital Signs: Because this visit was a virtual/telehealth visit, some criteria may be missing or patient reported. Any vitals not documented were not able to be obtained and vitals that have been documented are patient reported.  VideoDeclined- This patient declined Librarian, academic. Therefore the visit was completed with audio only.  Persons Participating in Visit: Patient.  AWV Questionnaire: No: Patient Medicare AWV questionnaire was not completed prior to this visit.  Cardiac Risk Factors include: advanced age (>50men, >56 women);dyslipidemia;hypertension;male gender     Objective:    Today's Vitals   05/31/24 0855  Weight: 188 lb (85.3 kg)  Height: 5' 7 (1.702 m)   Body mass index is 29.44 kg/m.     05/31/2024    8:54 AM 04/13/2023   11:51 AM 03/18/2022   11:44 AM 03/18/2021   11:34 AM 08/21/2020    9:19 AM 11/01/2019   10:01 AM 06/14/2019    9:02 AM  Advanced Directives  Does Patient Have a Medical Advance Directive? Yes Yes Yes Yes No Yes Yes  Type of Estate agent of [place];Living will Healthcare Power of [place];Living will Healthcare Power of [employer] of [place];Living will  Healthcare Power  of [place];Living will Healthcare Power of [place];Living will  Does patient want to make changes to medical advance directive? No - Patient declined No - Patient declined No - Patient declined No - Patient declined   No - Patient declined   Copy of Healthcare Power of Attorney in Chart? Yes - validated most recent copy scanned in chart (See row information) No - copy requested No - copy requested No - copy requested  No - copy requested No - copy requested   Would patient like information on creating a medical advance directive?   No - Patient declined  No - Patient declined       Data saved with a previous flowsheet row definition    Current Medications (verified) Outpatient Encounter Medications as of 05/31/2024  Medication Sig   Ascorbic Acid (VITAMIN C PO) Take by mouth.   atorvastatin  (LIPITOR) 10 MG tablet Take  1 tablet  every other day  for Cholesterol   azelastine  (OPTIVAR ) 0.05 % ophthalmic solution PLACE 1 DROP INTO BOTH EYES 2 TIMES DAILY AS NEEDED.   B Complex-C (SUPER B COMPLEX PO) Take by mouth daily.   Cholecalciferol (VITAMIN D  PO) Take 5,000 Int'l Units by mouth daily.   esomeprazole  (NEXIUM ) 40 MG capsule Take 1 capsule (40 mg total) by mouth as directed.   famotidine  (PEPCID ) 40 MG tablet Take  1 tablet  every Night   to Prevent  Heartburn & Acid Indigestion   Fexofenadine HCl (ALLEGRA PO) Take by mouth.   olmesartan  (BENICAR ) 40 MG tablet Take 1 tablet (40 mg total) by mouth daily. For BP  prednisoLONE  acetate (PRED FORTE ) 1 % ophthalmic suspension Place 1-2 drops in both eyes twice daily as needed.   Probiotic Product (PROBIOTIC BLEND PO) Take by mouth daily.   zinc gluconate 50 MG tablet Take 50 mg by mouth daily.   No facility-administered encounter medications on file as of 05/31/2024.    Allergies (verified) Hismanal [astemizole], Latex, and Penicillins   History: Past Medical History:  Diagnosis Date   Allergy    Cancer (HCC)    skin cancer melanoma and  basal cell lt. cheek   Cataract    both eyes no surgery needed   GERD (gastroesophageal reflux disease)    Hyperlipidemia    Hypertension    white coat syndrome   Prediabetes    Vitamin D  deficiency    Past Surgical History:  Procedure Laterality Date   BASAL CELL CARCINOMA EXCISION     COLONOSCOPY     ELBOW ARTHROSCOPY WITH TENDON RECONSTRUCTION Left 2013   GUM SURGERY Left 2017   bone graft, root canal left side   KNEE ARTHROSCOPY Right 1982, 1999   melanoma excision     cheek lt.   SPLENECTOMY     Family History  Problem Relation Age of Onset   Alzheimer's disease Mother    Heart disease Father    Dementia Father    Colon polyps Brother    Colon cancer Neg Hx    Esophageal cancer Neg Hx    Rectal cancer Neg Hx    Stomach cancer Neg Hx    Social History   Socioeconomic History   Marital status: Married    Spouse name: Not on file   Number of children: Not on file   Years of education: Not on file   Highest education level: Associate degree: occupational, Scientist, product/process development, or vocational program  Occupational History   Not on file  Tobacco Use   Smoking status: Never    Passive exposure: Never   Smokeless tobacco: Never  Vaping Use   Vaping status: Never Used  Substance and Sexual Activity   Alcohol use: Yes    Alcohol/week: 6.0 standard drinks of alcohol    Types: 1 Cans of beer, 5 Standard drinks or equivalent per week    Comment: Wine   Drug use: No   Sexual activity: Yes  Other Topics Concern   Not on file  Social History Narrative   Not on file   Social Drivers of Health   Financial Resource Strain: Low Risk  (05/31/2024)   Overall Financial Resource Strain (CARDIA)    Difficulty of Paying Living Expenses: Not hard at all  Food Insecurity: No Food Insecurity (05/31/2024)   Hunger Vital Sign    Worried About Running Out of Food in the Last Year: Never true    Ran Out of Food in the Last Year: Never true  Transportation Needs: No Transportation Needs  (05/31/2024)   PRAPARE - Administrator, Civil Service (Medical): No    Lack of Transportation (Non-Medical): No  Physical Activity: Sufficiently Active (05/31/2024)   Exercise Vital Sign    Days of Exercise per Week: 7 days    Minutes of Exercise per Session: 40 min  Stress: No Stress Concern Present (05/31/2024)   [employer] of Occupational Health - Occupational Stress Questionnaire    Feeling of Stress: Not at all  Social Connections: Socially Integrated (05/31/2024)   Social Connection and Isolation Panel    Frequency of Communication with Friends and Family: More than three  times a week    Frequency of Social Gatherings with Friends and Family: More than three times a week    Attends Religious Services: More than 4 times per year    Active Member of [employer] or Organizations: Yes    Attends Engineer, structural: More than 4 times per year    Marital Status: Married    Tobacco Counseling Counseling given: No    Clinical Intake:  Pre-visit preparation completed: Yes  Pain : No/denies pain     BMI - recorded: 29.44 Nutritional Status: BMI 25 -29 Overweight Nutritional Risks: None Diabetes: No  Lab Results  Component Value Date   HGBA1C 6.0 (H) 07/14/2023   HGBA1C 6.0 (H) 01/11/2023   HGBA1C 5.6 06/30/2022     How often do you need to have someone help you when you read instructions, pamphlets, or other written materials from your doctor or pharmacy?: 1 - Never  Interpreter Needed?: No  Information entered by :: Kandy Orris, CMA   Activities of Daily Living     05/31/2024    8:59 AM  In your present state of health, do you have any difficulty performing the following activities:  Hearing? 0  Vision? 0  Difficulty concentrating or making decisions? 0  Walking or climbing stairs? 0  Dressing or bathing? 0  Doing errands, shopping? 0  Preparing Food and eating ? N  Using the Toilet? N  In the past six months, have you accidently  leaked urine? N  Do you have problems with loss of bowel control? N  Managing your Medications? N  Managing your Finances? N  Housekeeping or managing your Housekeeping? N    Patient Care Team: de [place], Alonza Jansky, MD as PCP - General (Family Medicine) Ammon Bales, MD (Inactive) as Consulting Physician (Otolaryngology) Owens Blue, MD as Attending Physician (Cardiology) Tobin Forts, MD as Consulting Physician (Gastroenterology) Thais Fill, MD as Consulting Physician (Dermatology) Vertie Gosling, Ray A, OD (Optometry)  I have updated your Care Teams any recent Medical Services you may have received from other providers in the past year.     Assessment:   This is a routine wellness examination for [place].  Hearing/Vision screen Hearing Screening - Comments:: Wears hearing aids Vision Screening - Comments:: Wears rx glasses - up to date with routine eye exams with Dr Vertie Gosling   Goals Addressed               This Visit's Progress     Patient Stated (pt-stated)        Patient stated he plans to stay active - [address] and a garden he manages daily (walking/weight training)       Depression Screen     05/31/2024    9:01 AM 03/13/2024    9:55 AM 07/14/2023   10:13 PM 04/13/2023   11:58 AM 01/10/2023   11:50 PM 06/30/2022   12:01 PM 03/18/2022   11:49 AM  PHQ 2/9 Scores  PHQ - 2 Score 0 0 0 0 0 0 0  PHQ- 9 Score 0 0         Fall Risk     05/31/2024    9:00 AM 03/13/2024    9:55 AM 07/14/2023   10:13 PM 04/13/2023   11:51 AM 01/10/2023   11:50 PM  Fall Risk   Falls in the past year? 0 0 0 0 0  Number falls in past yr: 0 0  0   Injury with Fall? 0 0  0   Risk for fall due to : No Fall Risks No Fall Risks No Fall Risks No Fall Risks No Fall Risks  Follow up Falls evaluation completed;Falls prevention discussed Falls evaluation completed Falls prevention discussed;Education provided;Falls evaluation completed Falls evaluation completed;Falls prevention discussed Falls  evaluation completed;Education provided;Falls prevention discussed      Data saved with a previous flowsheet row definition    MEDICARE RISK AT HOME:  Medicare Risk at Home Any stairs in or around the home?: Yes If so, are there any without handrails?: No Home free of loose throw rugs in walkways, pet beds, electrical cords, etc?: Yes Adequate lighting in your home to reduce risk of falls?: Yes Life alert?: No Use of a cane, walker or w/c?: No Grab bars in the bathroom?: Yes Shower chair or bench in shower?: Yes Elevated toilet seat or a handicapped toilet?: Yes  TIMED UP AND GO:  Was the test performed?  No  Cognitive Function: 6CIT completed        05/31/2024    9:05 AM  6CIT Screen  What Year? 0 points  What month? 0 points  What time? 0 points  Count back from 20 0 points  Months in reverse 0 points  Repeat phrase 0 points  Total Score 0 points    Immunizations Immunization History  Administered Date(s) Administered   Influenza, High Dose Seasonal PF 10/12/2017, 10/13/2018, 10/17/2019, 10/14/2020, 10/13/2021, 10/05/2023   Influenza,inj,Quad PF,6+ Mos 10/04/2022   Influenza-Unspecified 10/23/2015   Moderna Sars-Covid-2 Vaccination 01/24/2020, 02/21/2020, 10/16/2020   PFIZER Comirnaty(Gray Top)Covid-19 Tri-Sucrose Vaccine 10/04/2022   Pneumococcal Conjugate-13 07/25/2017   Pneumococcal Polysaccharide-23 06/10/2011, 07/26/2018   Respiratory Syncytial Virus Vaccine,Recomb Aduvanted(Arexvy) 01/18/2023   Td 04/14/2006   Tdap 07/20/2016   Zoster, Live 01/16/2013    Screening Tests Health Maintenance  Topic Date Due   Zoster Vaccines- Shingrix (1 of 2) 01/23/1971   COVID-19 Vaccine (5 - 2024-25 season) 08/21/2023   INFLUENZA VACCINE  07/20/2024   Medicare Annual Wellness (AWV)  05/31/2025   DTaP/Tdap/Td (3 - Td or Tdap) 07/20/2026   Colonoscopy  03/01/2028   Pneumococcal Vaccine: 50+ Years  Completed   Hepatitis C Screening  Completed   HPV VACCINES  Aged  Out   Meningococcal B Vaccine  Aged Out    Health Maintenance  Health Maintenance Due  Topic Date Due   Zoster Vaccines- Shingrix (1 of 2) 01/23/1971   COVID-19 Vaccine (5 - 2024-25 season) 08/21/2023   Health Maintenance Items Addressed:05/31/2024   Additional Screening:  Vision Screening: Recommended annual ophthalmology exams for early detection of glaucoma and other disorders of the eye. Would you like a referral to an eye doctor? No    Dental Screening: Recommended annual dental exams for proper oral hygiene  Community Resource Referral / Chronic Care Management: CRR required this visit?  No   CCM required this visit?  No   Plan:    I have personally reviewed and noted the following in the patient's chart:   Medical and social history Use of alcohol, tobacco or illicit drugs  Current medications and supplements including opioid prescriptions. Patient is not currently taking opioid prescriptions. Functional ability and status Nutritional status Physical activity Advanced directives List of other physicians Hospitalizations, surgeries, and ER visits in previous 12 months Vitals Screenings to include cognitive, depression, and falls Referrals and appointments  In addition, I have reviewed and discussed with patient certain preventive protocols, quality metrics, and best practice recommendations. A written personalized care plan for  preventive services as well as general preventive health recommendations were provided to patient.   Patria Bookbinder, CMA   05/31/2024   After Visit Summary: (MyChart) Due to this being a telephonic visit, the after visit summary with patients personalized plan was offered to patient via MyChart   Notes: Nothing significant to report at this time.

## 2024-05-31 NOTE — Patient Instructions (Signed)
 Barry Flynn , Thank you for taking time out of your busy schedule to complete your Annual Wellness Visit with me. I enjoyed our conversation and look forward to speaking with you again next year. I, as well as your care team,  appreciate your ongoing commitment to your health goals. Please review the following plan we discussed and let me know if I can assist you in the future. Your Game plan/ To Do List     Follow up Visits: Next Medicare AWV with our clinical staff: 06/04/2025   Have you seen your provider in the last 6 months (3 months if uncontrolled diabetes)? No Next Office Visit with your provider: 07/19/2024  Clinician Recommendations:  Aim for 30 minutes of exercise or brisk walking, 6-8 glasses of water, and 5 servings of fruits and vegetables each day. Educated and advised on getting the Shingles and COVID vaccines in 2025.      This is a list of the screening recommended for you and due dates:  Health Maintenance  Topic Date Due   Zoster (Shingles) Vaccine (1 of 2) 01/23/1970   COVID-19 Vaccine (5 - 2024-25 season) 08/21/2023   Flu Shot  07/20/2024   Medicare Annual Wellness Visit  05/31/2025   DTaP/Tdap/Td vaccine (3 - Td or Tdap) 07/20/2026   Colon Cancer Screening  03/01/2028   Pneumococcal Vaccine for age over 23  Completed   Hepatitis C Screening  Completed   HPV Vaccine  Aged Out   Meningitis B Vaccine  Aged Out    Advanced directives: (In Chart) A copy of your advanced directives are scanned into your chart should your provider ever need it. Advance Care Planning is important because it:  [x]  Makes sure you receive the medical care that is consistent with your values, goals, and preferences  [x]  It provides guidance to your family and loved ones and reduces their decisional burden about whether or not they are making the right decisions based on your wishes.  Follow the link provided in your after visit summary or read over the paperwork we have mailed to you to help  you started getting your Advance Directives in place. If you need assistance in completing these, please reach out to us  so that we can help you!

## 2024-07-12 ENCOUNTER — Other Ambulatory Visit (HOSPITAL_BASED_OUTPATIENT_CLINIC_OR_DEPARTMENT_OTHER)

## 2024-07-12 DIAGNOSIS — R7303 Prediabetes: Secondary | ICD-10-CM | POA: Diagnosis not present

## 2024-07-12 DIAGNOSIS — I10 Essential (primary) hypertension: Secondary | ICD-10-CM

## 2024-07-12 DIAGNOSIS — Z125 Encounter for screening for malignant neoplasm of prostate: Secondary | ICD-10-CM | POA: Diagnosis not present

## 2024-07-12 DIAGNOSIS — E559 Vitamin D deficiency, unspecified: Secondary | ICD-10-CM

## 2024-07-12 DIAGNOSIS — E785 Hyperlipidemia, unspecified: Secondary | ICD-10-CM | POA: Diagnosis not present

## 2024-07-13 LAB — PSA TOTAL (REFLEX TO FREE): Prostate Specific Ag, Serum: 0.8 ng/mL (ref 0.0–4.0)

## 2024-07-13 LAB — COMPREHENSIVE METABOLIC PANEL WITH GFR
ALT: 30 IU/L (ref 0–44)
AST: 26 IU/L (ref 0–40)
Albumin: 4.4 g/dL (ref 3.8–4.8)
Alkaline Phosphatase: 89 IU/L (ref 44–121)
BUN/Creatinine Ratio: 21 (ref 10–24)
BUN: 21 mg/dL (ref 8–27)
Bilirubin Total: 0.6 mg/dL (ref 0.0–1.2)
CO2: 23 mmol/L (ref 20–29)
Calcium: 9.8 mg/dL (ref 8.6–10.2)
Chloride: 103 mmol/L (ref 96–106)
Creatinine, Ser: 0.98 mg/dL (ref 0.76–1.27)
Globulin, Total: 2.3 g/dL (ref 1.5–4.5)
Glucose: 98 mg/dL (ref 70–99)
Potassium: 4.5 mmol/L (ref 3.5–5.2)
Sodium: 140 mmol/L (ref 134–144)
Total Protein: 6.7 g/dL (ref 6.0–8.5)
eGFR: 82 mL/min/1.73 (ref >59)

## 2024-07-13 LAB — CBC WITH DIFFERENTIAL/PLATELET
Basophils Absolute: 0.1 x10E3/uL (ref 0.0–0.2)
Basos: 1 %
EOS (ABSOLUTE): 0.4 x10E3/uL (ref 0.0–0.4)
Eos: 5 %
Hematocrit: 45.8 % (ref 37.5–51.0)
Hemoglobin: 15.8 g/dL (ref 13.0–17.7)
Immature Grans (Abs): 0 x10E3/uL (ref 0.0–0.1)
Immature Granulocytes: 0 %
Lymphocytes Absolute: 2.3 x10E3/uL (ref 0.7–3.1)
Lymphs: 33 %
MCH: 32.2 pg (ref 26.6–33.0)
MCHC: 34.5 g/dL (ref 31.5–35.7)
MCV: 94 fL (ref 79–97)
Monocytes Absolute: 0.7 x10E3/uL (ref 0.1–0.9)
Monocytes: 10 %
Neutrophils Absolute: 3.5 x10E3/uL (ref 1.4–7.0)
Neutrophils: 51 %
Platelets: 306 x10E3/uL (ref 150–450)
RBC: 4.9 x10E6/uL (ref 4.14–5.80)
RDW: 12.6 % (ref 11.6–15.4)
WBC: 6.9 x10E3/uL (ref 3.4–10.8)

## 2024-07-13 LAB — HEMOGLOBIN A1C
Est. average glucose Bld gHb Est-mCnc: 120 mg/dL
Hgb A1c MFr Bld: 5.8 % — ABNORMAL HIGH (ref 4.8–5.6)

## 2024-07-13 LAB — LIPID PANEL
Chol/HDL Ratio: 2.8 ratio (ref 0.0–5.0)
Cholesterol, Total: 156 mg/dL (ref 100–199)
HDL: 55 mg/dL (ref >39)
LDL Chol Calc (NIH): 86 mg/dL (ref 0–99)
Triglycerides: 78 mg/dL (ref 0–149)
VLDL Cholesterol Cal: 15 mg/dL (ref 5–40)

## 2024-07-13 LAB — VITAMIN D 25 HYDROXY (VIT D DEFICIENCY, FRACTURES): Vit D, 25-Hydroxy: 90.2 ng/mL (ref 30.0–100.0)

## 2024-07-19 ENCOUNTER — Ambulatory Visit (HOSPITAL_BASED_OUTPATIENT_CLINIC_OR_DEPARTMENT_OTHER): Admitting: Family Medicine

## 2024-07-19 ENCOUNTER — Encounter (HOSPITAL_BASED_OUTPATIENT_CLINIC_OR_DEPARTMENT_OTHER): Payer: Self-pay | Admitting: Family Medicine

## 2024-07-19 VITALS — BP 138/81 | HR 70 | Ht 67.0 in | Wt 185.2 lb

## 2024-07-19 DIAGNOSIS — E785 Hyperlipidemia, unspecified: Secondary | ICD-10-CM

## 2024-07-19 DIAGNOSIS — R7303 Prediabetes: Secondary | ICD-10-CM

## 2024-07-19 DIAGNOSIS — I10 Essential (primary) hypertension: Secondary | ICD-10-CM | POA: Diagnosis not present

## 2024-07-19 DIAGNOSIS — E559 Vitamin D deficiency, unspecified: Secondary | ICD-10-CM

## 2024-07-19 NOTE — Progress Notes (Signed)
    Procedures performed today:    None.  Independent interpretation of notes and tests performed by another provider:   None.  Brief History, Exam, Impression, and Recommendations:    BP 138/81 (BP Location: Right Arm, Patient Position: Sitting, Cuff Size: Normal)   Pulse 70   Ht 5' 7 (1.702 m)   Wt 185 lb 3.2 oz (84 kg)   SpO2 94%   BMI 29.01 kg/m   Primary hypertension Assessment & Plan: Blood pressure improved in office today as compared to last OV.  Reports that home readings have been at goal.  Reports history of having higher blood pressure readings in the office.  Recommend continuing with current medication regimen.  Recommend intermittent monitoring of blood pressure at home, DASH diet. He brought cuff today, but left in car. Thinks he will go to car and return to have it checked today   Prediabetes Assessment & Plan: Prior hemoglobin A1c has been stable within prediabetes range.  No current symptoms to suggest overt diabetes.  A1c checked recently remains stable   Vitamin D  deficiency Assessment & Plan: Continues with vitamin D  supplementation.  Prior vitamin D  level has generally been well-controlled, recent lab with Vit D in normal range. No changes today.   Hyperlipidemia, unspecified hyperlipidemia type Assessment & Plan: Tolerating statin therapy.  Prior lipid panels have been generally well-controlled.  Can continue with current regimen.  Could consider switching to daily administration since patient is not aware of any issues with medication in the past   Return in about 6 months (around 01/19/2025).   ___________________________________________ Jamie Hafford de [place], MD, ABFM, CAQSM Primary [facility]

## 2024-07-19 NOTE — Assessment & Plan Note (Signed)
 Tolerating statin therapy.  Prior lipid panels have been generally well-controlled.  Can continue with current regimen.  Could consider switching to daily administration since patient is not aware of any issues with medication in the past

## 2024-07-19 NOTE — Assessment & Plan Note (Signed)
 Blood pressure improved in office today as compared to last OV.  Reports that home readings have been at goal.  Reports history of having higher blood pressure readings in the office.  Recommend continuing with current medication regimen.  Recommend intermittent monitoring of blood pressure at home, DASH diet. He brought cuff today, but left in car. Thinks he will go to car and return to have it checked today

## 2024-07-19 NOTE — Patient Instructions (Signed)

## 2024-07-19 NOTE — Assessment & Plan Note (Signed)
 Prior hemoglobin A1c has been stable within prediabetes range.  No current symptoms to suggest overt diabetes.  A1c checked recently remains stable

## 2024-07-19 NOTE — Assessment & Plan Note (Signed)
 Continues with vitamin D  supplementation.  Prior vitamin D  level has generally been well-controlled, recent lab with Vit D in normal range. No changes today.

## 2024-09-13 NOTE — Progress Notes (Signed)
 Barry Flynn                                          MRN: [id number]   09/13/2024   The [facility] Quality Team Specialist reviewed this patient medical record for the purposes of chart review for care gap closure. The following were reviewed: abstraction for care gap closure-controlling blood pressure.    [facility] Quality Team

## 2024-11-18 ENCOUNTER — Other Ambulatory Visit (HOSPITAL_BASED_OUTPATIENT_CLINIC_OR_DEPARTMENT_OTHER): Payer: Self-pay | Admitting: Family Medicine

## 2024-11-18 DIAGNOSIS — K219 Gastro-esophageal reflux disease without esophagitis: Secondary | ICD-10-CM

## 2025-01-17 ENCOUNTER — Telehealth (HOSPITAL_BASED_OUTPATIENT_CLINIC_OR_DEPARTMENT_OTHER): Payer: Self-pay

## 2025-01-17 NOTE — Telephone Encounter (Signed)
 Copied from CRM [id number]. Topic: Appointments - Scheduling Inquiry for Clinic >> Jan 17, 2025  8:39 AM Montie POUR wrote: Reason for CRM:  Mr. Barry Flynn would like a call back to let him know that if he cannot get out of his drive for the appointment on 01/21/25 will he be charge if he waits until Monday to see if he can't come in. Please call him at [phone]. If he does not answer his, leave a message yes or no about the charge, Thanks

## 2025-01-21 ENCOUNTER — Ambulatory Visit (HOSPITAL_BASED_OUTPATIENT_CLINIC_OR_DEPARTMENT_OTHER): Admitting: Family Medicine

## 2025-02-08 ENCOUNTER — Ambulatory Visit (HOSPITAL_BASED_OUTPATIENT_CLINIC_OR_DEPARTMENT_OTHER): Admitting: Family Medicine

## 2025-06-04 ENCOUNTER — Ambulatory Visit (HOSPITAL_BASED_OUTPATIENT_CLINIC_OR_DEPARTMENT_OTHER)
